# Patient Record
Sex: Female | Born: 1935 | Race: White | Hispanic: No | Smoking: Never smoker
Health system: Southern US, Community
[De-identification: ages and names within clinical notes are randomized; demographics above are authoritative.]

## PROBLEM LIST (undated history)

## (undated) DIAGNOSIS — K649 Unspecified hemorrhoids: Secondary | ICD-10-CM

## (undated) DIAGNOSIS — H6990 Unspecified Eustachian tube disorder, unspecified ear: Secondary | ICD-10-CM

## (undated) DIAGNOSIS — I209 Angina pectoris, unspecified: Secondary | ICD-10-CM

## (undated) DIAGNOSIS — Z9889 Other specified postprocedural states: Secondary | ICD-10-CM

## (undated) DIAGNOSIS — J189 Pneumonia, unspecified organism: Secondary | ICD-10-CM

## (undated) DIAGNOSIS — R112 Nausea with vomiting, unspecified: Secondary | ICD-10-CM

## (undated) DIAGNOSIS — H6980 Other specified disorders of Eustachian tube, unspecified ear: Secondary | ICD-10-CM

## (undated) DIAGNOSIS — T4145XA Adverse effect of unspecified anesthetic, initial encounter: Secondary | ICD-10-CM

## (undated) DIAGNOSIS — Z950 Presence of cardiac pacemaker: Secondary | ICD-10-CM

## (undated) DIAGNOSIS — R05 Cough: Secondary | ICD-10-CM

## (undated) DIAGNOSIS — N393 Stress incontinence (female) (male): Secondary | ICD-10-CM

## (undated) DIAGNOSIS — M1990 Unspecified osteoarthritis, unspecified site: Secondary | ICD-10-CM

## (undated) DIAGNOSIS — R0602 Shortness of breath: Secondary | ICD-10-CM

## (undated) DIAGNOSIS — C50919 Malignant neoplasm of unspecified site of unspecified female breast: Secondary | ICD-10-CM

## (undated) DIAGNOSIS — Z9010 Acquired absence of unspecified breast and nipple: Secondary | ICD-10-CM

## (undated) DIAGNOSIS — R059 Cough, unspecified: Secondary | ICD-10-CM

## (undated) DIAGNOSIS — I219 Acute myocardial infarction, unspecified: Secondary | ICD-10-CM

## (undated) DIAGNOSIS — R011 Cardiac murmur, unspecified: Secondary | ICD-10-CM

## (undated) DIAGNOSIS — I890 Lymphedema, not elsewhere classified: Secondary | ICD-10-CM

## (undated) DIAGNOSIS — T8859XA Other complications of anesthesia, initial encounter: Secondary | ICD-10-CM

## (undated) DIAGNOSIS — I10 Essential (primary) hypertension: Secondary | ICD-10-CM

## (undated) DIAGNOSIS — K219 Gastro-esophageal reflux disease without esophagitis: Secondary | ICD-10-CM

## (undated) DIAGNOSIS — H5440 Blindness, one eye, unspecified eye: Secondary | ICD-10-CM

## (undated) DIAGNOSIS — H409 Unspecified glaucoma: Secondary | ICD-10-CM

## (undated) DIAGNOSIS — I739 Peripheral vascular disease, unspecified: Secondary | ICD-10-CM

## (undated) DIAGNOSIS — I493 Ventricular premature depolarization: Secondary | ICD-10-CM

## (undated) DIAGNOSIS — E785 Hyperlipidemia, unspecified: Secondary | ICD-10-CM

## (undated) DIAGNOSIS — R55 Syncope and collapse: Secondary | ICD-10-CM

## (undated) HISTORY — DX: Hyperlipidemia, unspecified: E78.5

## (undated) HISTORY — DX: Other specified disorders of Eustachian tube, unspecified ear: H69.80

## (undated) HISTORY — DX: Malignant neoplasm of unspecified site of unspecified female breast: C50.919

## (undated) HISTORY — DX: Acute myocardial infarction, unspecified: I21.9

## (undated) HISTORY — DX: Ventricular premature depolarization: I49.3

## (undated) HISTORY — PX: DILATION AND CURETTAGE OF UTERUS: SHX78

## (undated) HISTORY — DX: Unspecified glaucoma: H40.9

## (undated) HISTORY — DX: Acquired absence of unspecified breast and nipple: Z90.10

## (undated) HISTORY — DX: Essential (primary) hypertension: I10

## (undated) HISTORY — DX: Gastro-esophageal reflux disease without esophagitis: K21.9

## (undated) HISTORY — DX: Cough: R05

## (undated) HISTORY — DX: Lymphedema, not elsewhere classified: I89.0

## (undated) HISTORY — DX: Unspecified eustachian tube disorder, unspecified ear: H69.90

## (undated) HISTORY — PX: REMOVAL OF BILATERAL TISSUE EXPANDERS WITH PLACEMENT OF BILATERAL BREAST IMPLANTS: SHX6431

## (undated) HISTORY — DX: Cough, unspecified: R05.9

---

## 1965-03-26 HISTORY — PX: ABDOMINAL HYSTERECTOMY: SHX81

## 1966-03-26 HISTORY — PX: MYOMECTOMY: SHX85

## 1979-03-27 HISTORY — PX: MASTECTOMY: SHX3

## 1983-03-27 HISTORY — PX: GALLBLADDER SURGERY: SHX652

## 1986-03-01 HISTORY — PX: CARDIAC CATHETERIZATION: SHX172

## 1991-03-27 HISTORY — PX: PLACEMENT OF BREAST IMPLANTS: SHX6334

## 2000-11-22 ENCOUNTER — Encounter: Payer: Self-pay | Admitting: Internal Medicine

## 2000-11-22 ENCOUNTER — Encounter: Admission: RE | Admit: 2000-11-22 | Discharge: 2000-11-22 | Payer: Self-pay | Admitting: Internal Medicine

## 2000-11-29 ENCOUNTER — Encounter: Payer: Self-pay | Admitting: Neurology

## 2000-11-29 ENCOUNTER — Encounter: Admission: RE | Admit: 2000-11-29 | Discharge: 2000-11-29 | Payer: Self-pay | Admitting: Neurology

## 2000-12-19 ENCOUNTER — Encounter: Payer: Self-pay | Admitting: Neurology

## 2000-12-19 ENCOUNTER — Encounter: Admission: RE | Admit: 2000-12-19 | Discharge: 2000-12-19 | Payer: Self-pay | Admitting: Neurology

## 2001-01-22 ENCOUNTER — Encounter: Payer: Self-pay | Admitting: Neurology

## 2001-01-22 ENCOUNTER — Ambulatory Visit (HOSPITAL_COMMUNITY): Admission: RE | Admit: 2001-01-22 | Discharge: 2001-01-22 | Payer: Self-pay | Admitting: *Deleted

## 2001-01-22 ENCOUNTER — Encounter: Admission: RE | Admit: 2001-01-22 | Discharge: 2001-01-22 | Payer: Self-pay | Admitting: Orthopedic Surgery

## 2001-03-06 ENCOUNTER — Encounter: Admission: RE | Admit: 2001-03-06 | Discharge: 2001-03-31 | Payer: Self-pay | Admitting: Neurology

## 2001-04-07 ENCOUNTER — Encounter: Admission: RE | Admit: 2001-04-07 | Discharge: 2001-07-06 | Payer: Self-pay | Admitting: Internal Medicine

## 2002-10-25 HISTORY — PX: CARDIAC CATHETERIZATION: SHX172

## 2002-11-01 ENCOUNTER — Inpatient Hospital Stay (HOSPITAL_COMMUNITY): Admission: EM | Admit: 2002-11-01 | Discharge: 2002-11-04 | Payer: Self-pay | Admitting: Emergency Medicine

## 2002-11-01 ENCOUNTER — Encounter: Payer: Self-pay | Admitting: Emergency Medicine

## 2002-11-01 ENCOUNTER — Encounter: Payer: Self-pay | Admitting: *Deleted

## 2003-03-27 HISTORY — PX: OTHER SURGICAL HISTORY: SHX169

## 2003-10-08 ENCOUNTER — Ambulatory Visit (HOSPITAL_COMMUNITY): Admission: RE | Admit: 2003-10-08 | Discharge: 2003-10-08 | Payer: Self-pay | Admitting: Gastroenterology

## 2003-11-01 ENCOUNTER — Encounter: Admission: RE | Admit: 2003-11-01 | Discharge: 2003-11-01 | Payer: Self-pay | Admitting: Gastroenterology

## 2003-11-20 ENCOUNTER — Encounter: Admission: RE | Admit: 2003-11-20 | Discharge: 2003-11-20 | Payer: Self-pay | Admitting: Orthopedic Surgery

## 2004-06-21 ENCOUNTER — Ambulatory Visit: Payer: Self-pay | Admitting: Internal Medicine

## 2004-10-06 ENCOUNTER — Ambulatory Visit: Payer: Self-pay | Admitting: Internal Medicine

## 2004-12-27 ENCOUNTER — Ambulatory Visit: Payer: Self-pay | Admitting: Internal Medicine

## 2005-01-29 ENCOUNTER — Ambulatory Visit: Payer: Self-pay | Admitting: Internal Medicine

## 2005-04-06 ENCOUNTER — Emergency Department (HOSPITAL_COMMUNITY): Admission: EM | Admit: 2005-04-06 | Discharge: 2005-04-06 | Payer: Self-pay | Admitting: Emergency Medicine

## 2005-06-18 ENCOUNTER — Ambulatory Visit (HOSPITAL_COMMUNITY): Admission: RE | Admit: 2005-06-18 | Discharge: 2005-06-18 | Payer: Self-pay | Admitting: Orthopedic Surgery

## 2005-10-08 ENCOUNTER — Ambulatory Visit: Payer: Self-pay | Admitting: Internal Medicine

## 2006-02-20 ENCOUNTER — Ambulatory Visit: Payer: Self-pay | Admitting: Internal Medicine

## 2006-04-05 ENCOUNTER — Ambulatory Visit: Payer: Self-pay | Admitting: Internal Medicine

## 2006-04-11 ENCOUNTER — Inpatient Hospital Stay (HOSPITAL_COMMUNITY): Admission: EM | Admit: 2006-04-11 | Discharge: 2006-04-12 | Payer: Self-pay | Admitting: Emergency Medicine

## 2006-04-11 HISTORY — PX: CARDIAC CATHETERIZATION: SHX172

## 2006-05-20 ENCOUNTER — Encounter: Admission: RE | Admit: 2006-05-20 | Discharge: 2006-05-20 | Payer: Self-pay | Admitting: Gastroenterology

## 2006-08-23 ENCOUNTER — Ambulatory Visit: Payer: Self-pay | Admitting: Internal Medicine

## 2007-01-11 DIAGNOSIS — H6980 Other specified disorders of Eustachian tube, unspecified ear: Secondary | ICD-10-CM | POA: Insufficient documentation

## 2007-01-11 DIAGNOSIS — Z9010 Acquired absence of unspecified breast and nipple: Secondary | ICD-10-CM | POA: Insufficient documentation

## 2007-01-11 DIAGNOSIS — J309 Allergic rhinitis, unspecified: Secondary | ICD-10-CM | POA: Insufficient documentation

## 2007-01-11 DIAGNOSIS — K219 Gastro-esophageal reflux disease without esophagitis: Secondary | ICD-10-CM | POA: Insufficient documentation

## 2007-01-11 DIAGNOSIS — I2510 Atherosclerotic heart disease of native coronary artery without angina pectoris: Secondary | ICD-10-CM | POA: Insufficient documentation

## 2007-01-11 DIAGNOSIS — J45901 Unspecified asthma with (acute) exacerbation: Secondary | ICD-10-CM | POA: Insufficient documentation

## 2007-04-03 ENCOUNTER — Ambulatory Visit: Payer: Self-pay | Admitting: Internal Medicine

## 2007-05-30 ENCOUNTER — Other Ambulatory Visit: Admission: RE | Admit: 2007-05-30 | Discharge: 2007-05-30 | Payer: Self-pay | Admitting: Obstetrics and Gynecology

## 2007-12-11 ENCOUNTER — Ambulatory Visit: Payer: Self-pay | Admitting: Internal Medicine

## 2007-12-18 ENCOUNTER — Ambulatory Visit: Payer: Self-pay | Admitting: Internal Medicine

## 2008-03-25 ENCOUNTER — Telehealth (INDEPENDENT_AMBULATORY_CARE_PROVIDER_SITE_OTHER): Payer: Self-pay | Admitting: *Deleted

## 2008-04-05 ENCOUNTER — Ambulatory Visit: Payer: Self-pay | Admitting: Pulmonary Disease

## 2008-04-23 ENCOUNTER — Ambulatory Visit: Payer: Self-pay | Admitting: Internal Medicine

## 2008-05-04 ENCOUNTER — Telehealth (INDEPENDENT_AMBULATORY_CARE_PROVIDER_SITE_OTHER): Payer: Self-pay | Admitting: *Deleted

## 2008-05-05 ENCOUNTER — Ambulatory Visit: Payer: Self-pay | Admitting: Internal Medicine

## 2008-05-10 ENCOUNTER — Telehealth: Payer: Self-pay | Admitting: Internal Medicine

## 2008-05-20 ENCOUNTER — Ambulatory Visit: Payer: Self-pay | Admitting: Internal Medicine

## 2008-05-20 DIAGNOSIS — I219 Acute myocardial infarction, unspecified: Secondary | ICD-10-CM | POA: Insufficient documentation

## 2008-06-17 ENCOUNTER — Ambulatory Visit: Payer: Self-pay | Admitting: Internal Medicine

## 2009-04-19 ENCOUNTER — Encounter: Admission: RE | Admit: 2009-04-19 | Discharge: 2009-04-19 | Payer: Self-pay | Admitting: Orthopedic Surgery

## 2010-02-07 ENCOUNTER — Telehealth (INDEPENDENT_AMBULATORY_CARE_PROVIDER_SITE_OTHER): Payer: Self-pay | Admitting: *Deleted

## 2010-03-16 ENCOUNTER — Telehealth (INDEPENDENT_AMBULATORY_CARE_PROVIDER_SITE_OTHER): Payer: Self-pay | Admitting: *Deleted

## 2010-03-26 HISTORY — PX: NM MYOCAR PERF WALL MOTION: HXRAD629

## 2010-04-16 ENCOUNTER — Encounter: Payer: Self-pay | Admitting: Gastroenterology

## 2010-04-25 NOTE — Progress Notes (Signed)
Summary: SINUS/ EAR PAIN  Phone Note Call from Patient Call back at Home Phone [phone]   Caller: Patient Call For: YOUNG Summary of Call: PT C/O SINUS CONGESTIONS/ EAR PAIN/ DIZZINESS DUE TO SINUS CONGESTION. (PT HASN'T BEEN SEEN IN OVER A YR). [facility] [address]  Initial call taken by: Tivis Ringer, CNA,  February 07, 2010 9:26 AM  Follow-up for Phone Call        sinus pressure/congestion with clear nasal drainage.  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  does not want an abx since she is not running a fever.  pt last seen in office 3.25.10.  no appts available with CDY.  pt wondered if she may take mucinex d, which i advised is fine.  would like to know any other recs.  please advise, thanks.  ALLERFIES: pcn, percodan, codeine.  [facility]  Follow-up by: Boone Master CNA/MA,  February 07, 2010 10:01 AM  Additional Follow-up for Phone Call Additional follow up Details #1::        Per CDY-Mucinex-D is fine and also consider Netipot.Reynaldo Minium CMA  February 07, 2010 11:49 AM     Additional Follow-up for Phone Call Additional follow up Details #2::    [facility] with Dr Roxy Cedar recommendations. Abigail Miyamoto RN  February 07, 2010 11:58 AM

## 2010-04-27 NOTE — Progress Notes (Signed)
Summary: ear pain > augmentin rx  Phone Note Call from Patient Call back at Home Phone [phone]   Caller: Patient Call For: young Summary of Call: pt c/o L ear throbbing. requests to see dr young this morning or tomorrow. she is taking musinex. no fever or cough. [facility] [address] Initial call taken by: Tivis Ringer, CNA,  March 16, 2010 9:42 AM  Follow-up for Phone Call        PT c/o L ear pain, sinus congestion, wheezing and incr SOB for 3-4 days...chest tightness. She denies any fever. She has been taking Mucinex D 1 by mouth two times a day. Pls advise. ALLERFIES: pcn, percodan, codeine Michel Bickers [facility]  March 16, 2010 12:38 PM  Additional Follow-up for Phone Call Additional follow up Details #1::        Cy recs:  Augmentin 500mg  1 two times a day.  # 14 x 0 refills.  Aundra Millet Reynolds LPN  March 16, 2010 2:21 PM   Called, spoke with pt.  She was informed of above recs per CY and verbalized understanding.  Aware [facility].  WCB if sxs do not improve or worsen. Additional Follow-up by: Gweneth Dimitri RN,  March 16, 2010 2:24 PM    New/Updated Medications: AUGMENTIN 500-125 MG TABS (AMOXICILLIN-POT CLAVULANATE) Take 1 tablet by mouth two times a day Prescriptions: AUGMENTIN 500-125 MG TABS (AMOXICILLIN-POT CLAVULANATE) Take 1 tablet by mouth two times a day  #14 x 0   Entered by:   Gweneth Dimitri RN   Authorized by:   Waymon Budge MD   Signed by:   Gweneth Dimitri RN on 03/16/2010   Method used:   [facility]        [facility] [phone][facility] (retail)       [address]       [place], [place]  [address]       Ph: [phone] or [phone]       Fax: [phone]   RxID:   [id number]

## 2010-08-11 NOTE — Assessment & Plan Note (Signed)
[facility]                             PULMONARY OFFICE NOTE   NAME:[name], Madison [name]                       MRN:          [id number]  DATE:02/20/2006                            DOB:          04-12-1933    PULMONARY/ALLERGY FOLLOW-UP:   PROBLEM LIST:  1. Allergic asthma.  2. Allergic rhinitis.  3. Eustachian dysfunction.  4. Coronary disease Madison Madison [name], M.D.)/MI.  5. Reflux.  6. Left mastectomy in 1980.   HISTORY:  1-year follow-up and she says this has been a better year.  She has not needed Advair since last winter, but does use Nasonex.  No  problems at all with her allergy vaccine.  She has a remote history of  bee sting desensitization by an allergist years ago and is familiar with  the Epipen.  We took time today to review risk/benefit considerations of  allergy vaccine, issues of administration outside of a medical office,  anaphylaxis, and epinephrine, and we refilled her epinephrine.   MEDICATIONS:  1. Allergy vaccine.  2. Epipen.  3. Prilosec.  4. Atenolol 25 mg.  5. Vytorin 10 mg.  6. Albuterol rescue inhaler.  7. Advair 250/50.  8. Flonase or Nasonex.  9. Occasional Benadryl or Claritin.   DRUG INTOLERANCES:  PENICILLIN, CODEINE, PERCODAN.   OBJECTIVE:  VITAL SIGNS:  Weight 170 pounds, blood pressure 108/60,  pulse regular 50, room air saturation 98%.  HEENT:  White mucous in the mid and inferior left nostril.  No postnasal  drainage or periorbital edema.  Conjunctivae were clear.  LUNGS:  Clear to P&A.  HEART:  Heart sounds regular and normal.   IMPRESSION:  1. Allergic rhinitis well controlled.  2. Mild intermittent asthma.   Note; she wishes to continue getting allergy vaccine and to give her own  injections.  We have reviewed epinephrine, beta blocker therapy, and her  cardiac disease history in the context of her allergy vaccine choices.   PLAN:  Schedule return 1 year, earlier p.r.n.  She will continue to  follow with Loraine Leriche A. Waynard Edwards, M.D. and Thereasa Solo. [name], M.D.     Clinton D. Maple Hudson, MD, Tonny Bollman, FACP  Electronically Signed    CDY/MedQ  DD: 02/20/2006  DT: 02/21/2006  Job #: [id number]   cc:   Loraine Leriche A. Perini, M.D.  Thereasa Solo. [name], M.D.

## 2010-08-11 NOTE — Op Note (Signed)
NAME:  Madison [name], Madison [name]                          ACCOUNT NO.:  [id number]   MEDICAL RECORD NO.:  [id number]                   PATIENT TYPE:  AMB   LOCATION:  ENDO                                 FACILITY:  [facility]   PHYSICIAN:  James L. Malon Kindle., M.D.          DATE OF BIRTH:  03-30-1933   DATE OF PROCEDURE:  10/08/2003  DATE OF DISCHARGE:                                 OPERATIVE REPORT   PROCEDURE:  Esophagogastroduodenoscopy.   MEDICATIONS:  Fentanyl 45 mcg, Versed 4.5 mg IV.   INDICATIONS FOR PROCEDURE:  Persistent esophageal reflux symptoms, despite  the use of Prilosec.   DESCRIPTION OF PROCEDURE:  The procedure had been explained to the patient  and consent obtained.  The patient in left lateral decubitus position.  Olympus scope is inserted and advanced.  The esophagus was entered easily.  The scope was advanced down to the stomach.  Port channel was seen and  passed.  The duodenum including the bulb and second portion were seen well  and was unremarkable.  Scope was withdrawn back into the stomach.  The  duodenal bulb was normal as was the port channel and antrum of the stomach.  Body of the stomach was free of ulcerations.  Fundus and cardia seen well in  the rectal ampulla and was normal.  There was a 3 to 4 cm hiatal hernia with  a widely patent GE junction and free reflux.  The distal and proximal  esophagus were endoscopically normal. The scope was withdrawn.  The patient  tolerated the procedure well, was maintained on low flow oxygen pulse  oximeter throughout the procedure.   ASSESSMENT:  Esophageal reflux disease nonresponsive to Prilosec, 530.81.   PLAN:  Will continue Prilosec.  Will increase to b.i.d.  Will also go ahead  and give her a trial of Reglan 10 mg a.c. and h.s.  Will give antireflux  instructions and see her back in the office in two to three months to  determine if we may need to consider an antireflux operation.  Will proceed  with colonoscopy at  this time as already planned.                                               James L. Malon Kindle., M.D.    Waldron Session  D:  10/08/2003  T:  10/08/2003  Job:  [id number]   cc:   Loraine Leriche A. Waynard Edwards, M.D.  [address]  [place]  [place] [address]  Fax: [phone]   Thereasa Solo. Little, M.D.  [address]  [address]  [place]  [place] [address]  Fax: [phone]

## 2010-08-11 NOTE — Op Note (Signed)
NAME:  Madison [name], Madison [name]                          ACCOUNT NO.:  [id number]   MEDICAL RECORD NO.:  [id number]                   PATIENT TYPE:  AMB   LOCATION:  ENDO                                 FACILITY:  [facility]   PHYSICIAN:  James L. Malon Kindle., M.D.          DATE OF BIRTH:  1933/04/15   DATE OF PROCEDURE:  10/08/2003  DATE OF DISCHARGE:                                 OPERATIVE REPORT   PROCEDURE:  Colonoscopy.   MEDICATIONS GIVEN:  Fentanyl 70 mcg, Versed 7.5 mg IV for both procedures.   INSTRUMENT USED:  Olympus pediatric colonoscope.   INDICATIONS FOR PROCEDURE:  Previous history of adenomatous polyps removed  in another town.  In addition to this, she has a sister with colon cancer  and two sisters who have had polyps.   DESCRIPTION OF PROCEDURE:  The procedure was explained to the patient and  consent obtained.  With the patient in the left lateral decubitus position,  the Olympus pediatric adjustable scope was inserted and advanced.  The prep  was excellent.  We were able to reach the cecum without difficulty.  The  ileocecal valve and appendiceal orifice were seen.  The scope was withdrawn  to the cecum, ascending colon, transverse colon, descending colon and  sigmoid colon were seen well, and were free of polyps.  No significant  diverticular disease.  The rectum was also free of polyps.  The scope was  withdrawn.  The patient tolerated the procedure well.   ASSESSMENT:  No evidence of further colon polyps, V12.72.   PLAN:  Will repeating colonoscopy in five years and recommend yearly  hemoccults.                                               James L. Malon Kindle., M.D.    Waldron Session  D:  10/08/2003  T:  10/08/2003  Job:  [id number]   cc:   Loraine Leriche A. Waynard Edwards, M.D.  [address]  [place]  [place] [address]  Fax: [phone]   Thereasa Solo. Little, M.D.  [address]  [address]  [place]  [place] [address]  Fax: [phone]

## 2010-08-11 NOTE — Discharge Summary (Signed)
NAMEMarland Kitchen  Madison [name], Madison [name]   MEDICAL RECORD NO.:  [id number]          PATIENT TYPE:  INP   LOCATION:  2022                         FACILITY:  [facility]   PHYSICIAN:  Lezlie Octave, N.P.     DATE OF BIRTH:  11-Apr-1933   DATE OF ADMISSION:  04/10/2006  DATE OF DISCHARGE:  04/12/2006                               DISCHARGE SUMMARY   Madison [name] is a 77 year old female patient with a history of coronary  artery disease status post MI remotely.  She presented to the ER with  complaints of chest pain.  She stated that several weeks ago she had had  acute onset of blurred vision and weakness associated with tightness and  chest and jaw pain that lasted 30 to 40 seconds and then abated.  She  apparently underwent echocardiogram and Myoview testing which showed  some impaired LV relaxation, mild AI, and no evidence of ischemia on the  Myoview.  Her blood pressure apparently is 182/102 and Avapro of 150 mg  everyday was started.  Since then, she has felt poorly with increased  fatigue and weakness.  Blood pressure is 100/62.  She again experienced  blurred vision lasting 30 seconds followed by chest pain, jaw, and pain  and weakness.  She took a nitroglycerin with some relief and called EMS.  She was brought to the emergency room.  She was admitted, and heparin  was started the following day.  It was discussed doing cardiac  catheterization; she agreed, and the procedure was performed by Dr.  Chanda Busing.  It revealed that she had a supernormal LV systolic  function.  She had trivial luminal irregularities in the coronary  arteries and the mid-LAD with 50%.  She had no significant narrowing in  her RCA.  Her Avapro was being held, and on April 12, 2006 she was  seen by Dr. Tresa Endo and considered stable for discharge home.  Blood  pressure was 104/63, pulse was 50, respirations were 20, temperature was  96.8.   LABORATORY DATA:  Hemoglobin 12.2, hematocrit 35.3,  platelets 206, WBC  5.6, sodium 141, potassium 4.1, BUN 13, creatinine 1.08, and platelets  were 111.  Other labs:  Lipid profile, total cholesterol is 192,  triglycerides 116, HDL is 44, LDL was 125.  CK-MBs were negative.  Urine  culture was negative.  D-dimer was less than 0.22.  TSH was 2.880.  EKG  showed no acute thoracic findings.  Prior left mastectomy, and mild left  basilar scarring.   DISCHARGE MEDICATIONS:  1. Aspirin 81 mg one time per day.  2. Plavix 75 mg one time per day.  3. Atenolol 25 mg one time per day.  4. Prilosec 20 mg one time per day.  5. Lipitor 10 mg one time per day.  6. Fish oil 2 per day as taken prior.  7. Caltrate taken as before.  8. She is not to take her Avapro.   She will follow up with Dr. Clarene Duke on April 17, 2006 at 4 p.m.  She  should see Dr. Waynard Edwards in followup.  She should avoid lifting, straining,  pushing, and pulling for a week, and she is to increase her activity  slowly.   DISCHARGE DIAGNOSES:  1. Chest pain, unknown etiology, not cardiac ischemia with cardiac      catheterization showing no high-grade disease.  2. Fatigue.  Improved off of Avapro.  3. Hypertension, controlled.  4. Dyslipidemia, now on Lipitor.  5. Prior history of elevated LFTs (liver function tests) with Zocor.  6. Status post a history of breast cancer with mastectomy.  7. History of hiatal hernia.  8. Status post cholecystectomy.  9. Hysterectomy.      Lezlie Octave, N.P.     BB/MEDQ  D:  04/12/2006  T:  04/13/2006  Job:  [id number]   cc:   Loraine Leriche A. Perini, M.D.

## 2010-08-11 NOTE — Cardiovascular Report (Signed)
Madison [name], Madison [name] NO.:  [id number]   MEDICAL RECORD NO.:  [id number]          PATIENT TYPE:  INP   LOCATION:  2022                         FACILITY:  [facility]   PHYSICIAN:  Madaline Savage, M.D.DATE OF BIRTH:  16-Nov-1933   DATE OF PROCEDURE:  04/11/2006  DATE OF DISCHARGE:                            CARDIAC CATHETERIZATION   PROCEDURES PERFORMED:  1. Selective coronary angiography by Judkins technique.  2. Retrograde left heart catheterization.  3. Left ventricular angiography.   COMPLICATIONS:  None.   ENTRY SITE:  Right femoral.   DYE USED:  Omnipaque.   PATIENT PROFILE:  The patient is a 77 year old white married female who  is a medical patient of Dr. Rolm Gala and a cardiology patient of Dr.  Caprice Kluver.  She has a history of cardiac catheterization dating back to  1987 and then another catheterization 2004 that was apparently done at  [facility].  I have never seen a cath  report, but it was said at that time she may have had as much as a 70%  smooth lesion in her LAD.  Dr. Susa Griffins did a cath on her in  2004 and she had a 60-70% smooth LAD stenosis and a 30% RCA lesion.   I saw the patient on 05/16/2005 after an episode of chest pain and  performed an echocardiogram which was normal and a Cardiolite stress  test which showed no evidence of ischemia and an LVEF of 81%.  The  patient was reassured.  Since February, the patient has had at least 2  more episodes of chest pain that have caused her to come to the office  for attention.  On these occasions, she has seen different physicians  and both have felt that the patient's symptoms were noncardiac, atypical  and that the patient had a high level of anxiety.   The patient was admitted to the hospital last evening with complaints of  chest pain.  Cardiac enzymes have been negative.  EKGs have failed to  show any sign of ischemia.  Today's cardiac  catheterization was  performed after discussing the issues with the patient's husband and the  patient herself.  We predicted that cath will be negative, but we feel  like it should be done to reassure the patient.   RESULTS:  The left ventricular pressure was 135/6, end-diastolic  pressure 17.  Central aortic pressure 135/60, mean of 95.  No aortic  valve gradient by pullback technique.   ANGIOGRAPHIC RESULTS:  The patient's left main coronary artery was large  in size, medium in length, noncalcified and normal.   The LAD was a relatively small and definitely smaller than the  circumflex.  The LAD contained mild calcification in a linear fashion  along the left main and there was an area of 50% smooth eccentric  narrowing in the LAD just beyond only diagonal in the patient's coronary  system.  It did not appear to be flow limiting.  The diagonal itself  appeared normal.  One major septal appeared normal.   The circumflex coronary artery was  nondominant and gave rise to a large  obtuse marginal branch which bifurcated.  No lesions were seen in the  circumflex coronary artery.   The right coronary artery was a dominant vessel giving rise to both a  pulmonary conus and a sinus node branch, 2 acute marginal branches and  at least 4-5 distal RCA branches, one of which was a posterior  descending.  No lesions were seen in the entirety of the right coronary  artery system.  Left ventricular angiography showed normal  contractility, estimated ejection fraction of 70%.  No wall motion  abnormalities.  No mitral regurgitation.  No evidence of mitral  prolapse.  No LV apical thrombus and ascending aorta appeared  unremarkable.   FINAL DIAGNOSES:  1. Recurrent chronic chest pain atypical in nature.  2. Supernormal left ventricular systolic function.  3. Trivial luminal irregularities in the coronary arteries and the mid-      left anterior descending 50%.  I did not see any significant       narrowing in the right coronary artery.   PLAN:  The patient is reassured.  She will follow up with the excellent  care of Dr. Waynard Edwards and Dr. Clarene Duke and will possibly be discharged today  or tomorrow.           ______________________________  Madaline Savage, M.D.     WHG/MEDQ  D:  04/11/2006  T:  04/11/2006  Job:  [id number]   cc:   Loraine Leriche A. Perini, M.D.  Thereasa Solo. Little, M.D.  [facility]

## 2010-12-28 ENCOUNTER — Encounter (HOSPITAL_BASED_OUTPATIENT_CLINIC_OR_DEPARTMENT_OTHER): Payer: Medicare Other | Admitting: Oncology

## 2010-12-28 ENCOUNTER — Encounter: Payer: Medicare Other | Admitting: Oncology

## 2010-12-28 DIAGNOSIS — Z853 Personal history of malignant neoplasm of breast: Secondary | ICD-10-CM

## 2011-05-14 ENCOUNTER — Telehealth: Payer: Self-pay | Admitting: Internal Medicine

## 2011-05-14 MED ORDER — AZITHROMYCIN 250 MG PO TABS: ORAL_TABLET | ORAL | Status: DC

## 2011-05-14 NOTE — Telephone Encounter (Signed)
Per CY-give Zpak #1 take as directed no refills and offer appt when able.

## 2011-05-14 NOTE — Telephone Encounter (Signed)
I spoke with pt and is aware of cdy recs. She voiced her understanding and would like [facility] [address]. Pt alos made an apt for 06/06/11 at 9:45

## 2011-05-14 NOTE — Telephone Encounter (Signed)
Pt says about 2 weeks ago she began having sinus congestion and drainage and since last Thurs., 2/14 she has had yellow nasal drainage, cough with white mucus, sore throat off and on, wheezing and chest tightness. She is currently using Benadryl, Mucinex D and Saline NS. Pt denies any increased sob or fever. Pls advise. Allergies  Allergen Reactions  . Codeine   . Oxycodone-Aspirin   . Penicillins

## 2011-05-21 ENCOUNTER — Telehealth: Payer: Self-pay | Admitting: Pulmonary Disease

## 2011-05-21 NOTE — Telephone Encounter (Signed)
I spoke with the pt and she states she is much better since she took the zpak prescribed last week, but she is not 100% better. I advised the pt to continue the mucinex and saline NS and that the zpak will keep working in her system for several more days. The pt has an appt on 06-06-11 with CY. Carron Curie, CMA

## 2011-06-06 ENCOUNTER — Ambulatory Visit (INDEPENDENT_AMBULATORY_CARE_PROVIDER_SITE_OTHER): Payer: Medicare Other | Admitting: Internal Medicine

## 2011-06-06 ENCOUNTER — Encounter: Payer: Self-pay | Admitting: Internal Medicine

## 2011-06-06 VITALS — BP 118/74 | HR 77 | Ht 64.0 in | Wt 173.0 lb

## 2011-06-06 DIAGNOSIS — J45909 Unspecified asthma, uncomplicated: Secondary | ICD-10-CM

## 2011-06-06 DIAGNOSIS — J309 Allergic rhinitis, unspecified: Secondary | ICD-10-CM

## 2011-06-06 NOTE — Progress Notes (Signed)
06/06/11- 77 yoF followed for allergic asthma, allergic rhinitis complicated by GERD, CAD/MI, hx L mastectomy LOV- 06/17/08 Z pak and Mucinex helped her through a bronchitis in mid-February. Since then mild residual congestion is slowly clearing and she denies need for further intervention. Daily saline nasal spray, humidifier in bedroom. Regularly at gym.  Discussed antihistamines vs decongestants and use as needed during Spring pollen.  ROS-see HPI Constitutional:   No-   weight loss, night sweats, fevers, chills, fatigue, lassitude. HEENT:   No-  headaches, difficulty swallowing, tooth/dental problems, sore throat,       No-  sneezing, itching, ear ache,  +nasal congestion, post nasal drip,  CV:  No-   chest pain, orthopnea, PND, swelling in lower extremities, anasarca, dizziness, palpitations Resp: No-   shortness of breath with exertion or at rest.              No-   productive cough,  + non-productive cough,  No- coughing up of blood.              No-   change in color of mucus.  No- wheezing.   Skin: No-   rash or lesions. GI:  No-   heartburn, indigestion, abdominal pain, nausea, vomiting, GU: No-   dysuria,  MS:  No-   joint pain or swelling.  No- decreased range of motion.  No- back pain. Neuro-     nothing unusual Psych:  No- change in mood or affect. No depression or anxiety.  No memory loss.  OBJ- Physical Exam General- Alert, Oriented, Affect-appropriate, Distress- none acute Skin- rash-none, lesions- none, excoriation- none Lymphadenopathy- none Head- atraumatic            Eyes- Gross vision intact, PERRLA, conjunctivae and secretions clear            Ears- Hearing, canals-normal            Nose- Clear, no-Septal dev, mucus, polyps, erosion, perforation             Throat- Mallampati II , mucosa clear , drainage- none, tonsils- atrophic Neck- flexible , trachea midline, no stridor , thyroid nl, carotid no bruit Chest - symmetrical excursion , unlabored           Heart/CV-  RRR , no murmur , no gallop  , no rub, nl s1 s2                           - JVD- none , edema- none, stasis changes- none, varices- none           Lung- clear to P&A, wheeze- none, cough- none , dullness-none, rub- none           Chest wall-  Abd Br/ Gen/ Rectal- Not done, not indicated Extrem- cyanosis- none, clubbing, none, atrophy- none, strength- nl Neuro- tremor

## 2011-06-06 NOTE — Assessment & Plan Note (Signed)
Exacerbation with a viral bronchitis during the winter, but not needing significant intervention now and gradually back to baseline. She will call for meds if needed.

## 2011-06-06 NOTE — Patient Instructions (Signed)
Please call as needed 

## 2011-06-06 NOTE — Assessment & Plan Note (Signed)
Discussed management of seasonal rhinitis.

## 2011-07-20 ENCOUNTER — Ambulatory Visit: Payer: Medicare Other | Admitting: Family

## 2012-03-26 HISTORY — PX: TRANSTHORACIC ECHOCARDIOGRAM: SHX275

## 2012-04-21 ENCOUNTER — Other Ambulatory Visit (HOSPITAL_COMMUNITY): Payer: Self-pay | Admitting: Internal Medicine

## 2012-04-21 DIAGNOSIS — R0600 Dyspnea, unspecified: Secondary | ICD-10-CM

## 2012-04-21 DIAGNOSIS — R011 Cardiac murmur, unspecified: Secondary | ICD-10-CM

## 2012-05-05 ENCOUNTER — Ambulatory Visit (HOSPITAL_COMMUNITY)
Admission: RE | Admit: 2012-05-05 | Discharge: 2012-05-05 | Disposition: A | Discharge status: Home / Self Care | Payer: Medicare Other | Source: Ambulatory Visit | Attending: Internal Medicine | Admitting: Internal Medicine

## 2012-05-05 DIAGNOSIS — I059 Rheumatic mitral valve disease, unspecified: Secondary | ICD-10-CM | POA: Insufficient documentation

## 2012-05-05 DIAGNOSIS — I369 Nonrheumatic tricuspid valve disorder, unspecified: Secondary | ICD-10-CM | POA: Insufficient documentation

## 2012-05-05 DIAGNOSIS — R0600 Dyspnea, unspecified: Secondary | ICD-10-CM

## 2012-05-05 DIAGNOSIS — R0602 Shortness of breath: Secondary | ICD-10-CM | POA: Insufficient documentation

## 2012-05-05 DIAGNOSIS — R011 Cardiac murmur, unspecified: Secondary | ICD-10-CM

## 2012-05-05 HISTORY — PX: OTHER SURGICAL HISTORY: SHX169

## 2012-05-05 NOTE — Progress Notes (Signed)
2D Echo Performed 05/05/2012    Clearence Ped, RCS

## 2012-06-03 ENCOUNTER — Telehealth: Payer: Self-pay | Admitting: Internal Medicine

## 2012-06-03 NOTE — Telephone Encounter (Signed)
ATC patient x3. No return call back. Sent letter 06/03/12.  °

## 2013-01-09 ENCOUNTER — Ambulatory Visit (INDEPENDENT_AMBULATORY_CARE_PROVIDER_SITE_OTHER): Payer: Medicare Other

## 2013-01-09 DIAGNOSIS — Z23 Encounter for immunization: Secondary | ICD-10-CM

## 2013-03-10 ENCOUNTER — Other Ambulatory Visit (HOSPITAL_COMMUNITY): Payer: Medicare Other

## 2013-03-26 DIAGNOSIS — Z950 Presence of cardiac pacemaker: Secondary | ICD-10-CM

## 2013-03-26 HISTORY — DX: Presence of cardiac pacemaker: Z95.0

## 2013-08-06 ENCOUNTER — Telehealth: Payer: Self-pay

## 2013-08-06 NOTE — Telephone Encounter (Signed)
Left message to call office to discuss symptoms.

## 2013-08-06 NOTE — Telephone Encounter (Signed)
Message copied by Denman George on Thu Aug 06, 2013  4:51 PM ------      Message from: Merleen Nicely      Created: Thu Aug 06, 2013  3:35 PM      Contact: [phone]       Alorah needs an appointment asap. Said she called and left a message.            Please call.            Thanks      Ebony Hail             ------

## 2013-08-07 NOTE — Telephone Encounter (Signed)
Attempted to contact pt.  Left voice message to call back to office to discuss symptoms.

## 2013-08-10 ENCOUNTER — Telehealth: Payer: Self-pay | Admitting: *Deleted

## 2013-08-10 NOTE — Telephone Encounter (Signed)
LMTCB regarding patient's request to make an appt. We have not seen her before; we had a past appt for a carotid duplex on 03-10-13 ( referred from Dr. Joylene Draft)  in our vascular lab but the patient had called and cancelled that appt. Called her to see what symptoms she has and to find out if she needs another referral from Dr. Joylene Draft.

## 2013-08-24 ENCOUNTER — Ambulatory Visit (HOSPITAL_COMMUNITY)
Admission: RE | Admit: 2013-08-24 | Discharge: 2013-08-24 | Disposition: A | Discharge status: Home / Self Care | Payer: Medicare HMO | Source: Ambulatory Visit | Attending: Surgery | Admitting: Surgery

## 2013-08-24 ENCOUNTER — Other Ambulatory Visit (HOSPITAL_COMMUNITY): Payer: Self-pay | Admitting: Internal Medicine

## 2013-08-24 DIAGNOSIS — R42 Dizziness and giddiness: Secondary | ICD-10-CM | POA: Insufficient documentation

## 2013-11-03 ENCOUNTER — Telehealth: Payer: Self-pay | Admitting: Internal Medicine

## 2013-11-03 NOTE — Telephone Encounter (Signed)
Thanks .. Sounds good.  Dr. H 

## 2013-11-03 NOTE — Telephone Encounter (Signed)
Patient states that she has been having some shortness of breath, fluid retention and some L. Shoulder pain.  Please call to advise.

## 2013-11-03 NOTE — Telephone Encounter (Signed)
Spoke with [place], [place]. Advised that if patient is not having active chest pain, to schedule an appmt with Dr. Debara Pickett or APP.   Patient is scheduled for 8/20 at 945.   Brittainy, PA also advised that patient use NTG prn for chest pain. When this was communicated to patient, patient informed RN that she had taken NTGx2 yesterday after episode while working in garden.   She also reports that when she used to see Dr. Rex Kras, he had told her she could use NTG for pain associated with her hiatal hernia, and has been using it occasionally for this for the last 6 months, so she thinks she may have actually been having cardiac issues for longer.   RN advised patient to notify office if she is have more frequent episode of chest discomfort/is having to use more NTG to obtain relief/is having to use NTG more frequently. Advised that if this, or worsening SOB occurs, to contact office to let us know and go to [facility].   Patient voiced understanding.

## 2013-11-03 NOTE — Telephone Encounter (Signed)
Returned call to patient. She states she had a heart attack a few years back, has some known blockages but has never had a stent. She is concerned because she has recently had some lightheadedness, pain in left shoulder and ear. Complains of extreme shortness of breath (as in when taking trash out and walking over small hill). She states yesterday she was doing some gardening and moved a pot and had a horrible pain in the center of her chest, cold sweats and felt lightheaded. She had a carotid doppler study in June done at [facility] that Dr. Joylene Draft ordered for dizziness that did not indicated carotid artery disease. Her last OV with Dr. Debara Pickett was Feb. 2014

## 2013-11-04 ENCOUNTER — Encounter: Payer: Self-pay | Admitting: *Deleted

## 2013-11-12 ENCOUNTER — Encounter (HOSPITAL_COMMUNITY): Payer: Self-pay | Admitting: *Deleted

## 2013-11-12 ENCOUNTER — Ambulatory Visit (INDEPENDENT_AMBULATORY_CARE_PROVIDER_SITE_OTHER): Payer: Commercial Managed Care - HMO | Admitting: Internal Medicine

## 2013-11-12 VITALS — BP 110/82 | HR 65 | Ht 64.0 in | Wt 177.0 lb

## 2013-11-12 DIAGNOSIS — I493 Ventricular premature depolarization: Secondary | ICD-10-CM

## 2013-11-12 DIAGNOSIS — I219 Acute myocardial infarction, unspecified: Secondary | ICD-10-CM

## 2013-11-12 DIAGNOSIS — E785 Hyperlipidemia, unspecified: Secondary | ICD-10-CM

## 2013-11-12 DIAGNOSIS — I10 Essential (primary) hypertension: Secondary | ICD-10-CM

## 2013-11-12 DIAGNOSIS — R079 Chest pain, unspecified: Secondary | ICD-10-CM

## 2013-11-12 DIAGNOSIS — I4949 Other premature depolarization: Secondary | ICD-10-CM

## 2013-11-12 DIAGNOSIS — I2510 Atherosclerotic heart disease of native coronary artery without angina pectoris: Secondary | ICD-10-CM

## 2013-11-12 MED ORDER — NITROGLYCERIN 0.4 MG SL SUBL: 0.4000 mg | SUBLINGUAL_TABLET | SUBLINGUAL | Status: DC | PRN

## 2013-11-12 NOTE — Patient Instructions (Signed)
Your physician has requested that you have a lexiscan myoview. For further information please visit [email or web address]. Please follow instruction sheet, as given.  Your physician recommends that you schedule a follow-up appointment after your stress test.   

## 2013-11-13 ENCOUNTER — Telehealth (HOSPITAL_COMMUNITY): Payer: Self-pay | Admitting: *Deleted

## 2013-11-13 ENCOUNTER — Encounter: Payer: Self-pay | Admitting: Internal Medicine

## 2013-11-13 DIAGNOSIS — E785 Hyperlipidemia, unspecified: Secondary | ICD-10-CM | POA: Insufficient documentation

## 2013-11-13 DIAGNOSIS — I493 Ventricular premature depolarization: Secondary | ICD-10-CM | POA: Insufficient documentation

## 2013-11-13 DIAGNOSIS — I10 Essential (primary) hypertension: Secondary | ICD-10-CM | POA: Insufficient documentation
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
## 2013-11-17 ENCOUNTER — Encounter (HOSPITAL_COMMUNITY): Payer: Self-pay | Admitting: Emergency Medicine

## 2013-11-17 ENCOUNTER — Telehealth: Payer: Self-pay | Admitting: Internal Medicine

## 2013-11-17 ENCOUNTER — Inpatient Hospital Stay (HOSPITAL_COMMUNITY)
Admission: EM | Admit: 2013-11-17 | Discharge: 2013-11-19 | DRG: 244 | Disposition: A | Discharge status: Home / Self Care | Payer: Medicare HMO | Attending: Cardiology | Admitting: Cardiology

## 2013-11-17 ENCOUNTER — Emergency Department (HOSPITAL_COMMUNITY): Payer: Medicare HMO

## 2013-11-17 DIAGNOSIS — K219 Gastro-esophageal reflux disease without esophagitis: Secondary | ICD-10-CM | POA: Diagnosis present

## 2013-11-17 DIAGNOSIS — E785 Hyperlipidemia, unspecified: Secondary | ICD-10-CM | POA: Diagnosis present

## 2013-11-17 DIAGNOSIS — J45909 Unspecified asthma, uncomplicated: Secondary | ICD-10-CM | POA: Diagnosis present

## 2013-11-17 DIAGNOSIS — R55 Syncope and collapse: Secondary | ICD-10-CM | POA: Diagnosis not present

## 2013-11-17 DIAGNOSIS — I495 Sick sinus syndrome: Principal | ICD-10-CM | POA: Diagnosis present

## 2013-11-17 DIAGNOSIS — Z7982 Long term (current) use of aspirin: Secondary | ICD-10-CM

## 2013-11-17 DIAGNOSIS — I2510 Atherosclerotic heart disease of native coronary artery without angina pectoris: Secondary | ICD-10-CM | POA: Diagnosis present

## 2013-11-17 DIAGNOSIS — I252 Old myocardial infarction: Secondary | ICD-10-CM | POA: Diagnosis not present

## 2013-11-17 DIAGNOSIS — I10 Essential (primary) hypertension: Secondary | ICD-10-CM | POA: Diagnosis present

## 2013-11-17 DIAGNOSIS — Z853 Personal history of malignant neoplasm of breast: Secondary | ICD-10-CM | POA: Diagnosis not present

## 2013-11-17 DIAGNOSIS — I440 Atrioventricular block, first degree: Secondary | ICD-10-CM | POA: Diagnosis present

## 2013-11-17 DIAGNOSIS — Z8249 Family history of ischemic heart disease and other diseases of the circulatory system: Secondary | ICD-10-CM

## 2013-11-17 DIAGNOSIS — Z9010 Acquired absence of unspecified breast and nipple: Secondary | ICD-10-CM | POA: Diagnosis not present

## 2013-11-17 DIAGNOSIS — I455 Other specified heart block: Secondary | ICD-10-CM | POA: Diagnosis present

## 2013-11-17 HISTORY — DX: Syncope and collapse: R55

## 2013-11-17 HISTORY — DX: Other complications of anesthesia, initial encounter: T88.59XA

## 2013-11-17 HISTORY — DX: Adverse effect of unspecified anesthetic, initial encounter: T41.45XA

## 2013-11-17 HISTORY — DX: Peripheral vascular disease, unspecified: I73.9

## 2013-11-17 HISTORY — DX: Other specified postprocedural states: R11.2

## 2013-11-17 HISTORY — DX: Cardiac murmur, unspecified: R01.1

## 2013-11-17 HISTORY — DX: Shortness of breath: R06.02

## 2013-11-17 HISTORY — DX: Angina pectoris, unspecified: I20.9

## 2013-11-17 HISTORY — DX: Other specified postprocedural states: Z98.890

## 2013-11-17 HISTORY — DX: Unspecified osteoarthritis, unspecified site: M19.90

## 2013-11-17 LAB — CBC
HCT: 39.7 % (ref 36.0–46.0)
HEMATOCRIT: 37.1 % (ref 36.0–46.0)
Hemoglobin: 13.5 g/dL (ref 12.0–15.0)
Hemoglobin: 12.7 g/dL (ref 12.0–15.0)
MCH: 29.5 pg (ref 26.0–34.0)
MCH: 29.2 pg (ref 26.0–34.0)
MCHC: 34 g/dL (ref 30.0–36.0)
MCHC: 34.2 g/dL (ref 30.0–36.0)
MCV: 86.9 fL (ref 78.0–100.0)
MCV: 85.3 fL (ref 78.0–100.0)
PLATELETS: 196 10*3/uL (ref 150–400)
Platelets: 185 10*3/uL (ref 150–400)
RBC: 4.57 MIL/uL (ref 3.87–5.11)
RBC: 4.35 MIL/uL (ref 3.87–5.11)
RDW: 12.4 % (ref 11.5–15.5)
RDW: 12.3 % (ref 11.5–15.5)
WBC: 4.6 10*3/uL (ref 4.0–10.5)
WBC: 5.3 10*3/uL (ref 4.0–10.5)

## 2013-11-17 LAB — BASIC METABOLIC PANEL
ANION GAP: 10 (ref 5–15)
BUN: 23 mg/dL (ref 6–23)
CO2: 24 mEq/L (ref 19–32)
Calcium: 9.2 mg/dL (ref 8.4–10.5)
Chloride: 105 mEq/L (ref 96–112)
Creatinine, Ser: 1.06 mg/dL (ref 0.50–1.10)
GFR calc non Af Amer: 49 mL/min — ABNORMAL LOW (ref >90)
GFR, EST AFRICAN AMERICAN: 57 mL/min — AB (ref >90)
Glucose, Bld: 99 mg/dL (ref 70–99)
Potassium: 4.3 mEq/L (ref 3.7–5.3)
Sodium: 139 mEq/L (ref 137–147)

## 2013-11-17 LAB — CREATININE, SERUM
Creatinine, Ser: 0.98 mg/dL (ref 0.50–1.10)
GFR calc Af Amer: 62 mL/min — ABNORMAL LOW (ref >90)
GFR calc non Af Amer: 54 mL/min — ABNORMAL LOW (ref >90)

## 2013-11-17 LAB — TSH: TSH: 1.95 u[IU]/mL (ref 0.350–4.500)

## 2013-11-17 LAB — I-STAT TROPONIN, ED: Troponin i, poc: 0 ng/mL (ref 0.00–0.08)

## 2013-11-17 LAB — TROPONIN I
Troponin I: <0.3 ng/mL (ref <0.30)
Troponin I: <0.3 ng/mL (ref <0.30)
Troponin I: <0.3 ng/mL (ref <0.30)

## 2013-11-17 MED ORDER — HEPARIN SODIUM (PORCINE) 5000 UNIT/ML IJ SOLN
5000.0000 [IU] | Freq: Three times a day (TID) | INTRAMUSCULAR | Status: DC
  Administered 2013-11-17 – 2013-11-19 (×5): 5000 [IU] via SUBCUTANEOUS
  Filled 2013-11-17 (×10): qty 1

## 2013-11-17 MED ORDER — ASPIRIN 81 MG PO CHEW
324.0000 mg | CHEWABLE_TABLET | Freq: Once | ORAL | Status: AC
  Administered 2013-11-17: 162 mg via ORAL
  Filled 2013-11-17: qty 4

## 2013-11-17 MED ORDER — NITROGLYCERIN 0.4 MG SL SUBL: 0.4000 mg | SUBLINGUAL_TABLET | SUBLINGUAL | Status: DC | PRN

## 2013-11-17 MED ORDER — ASPIRIN EC 81 MG PO TBEC
81.0000 mg | DELAYED_RELEASE_TABLET | Freq: Every day | ORAL | Status: DC
  Administered 2013-11-18 – 2013-11-19 (×2): 81 mg via ORAL
  Filled 2013-11-17 (×2): qty 1

## 2013-11-17 MED ORDER — ASPIRIN EC 81 MG PO TBEC: 81.0000 mg | DELAYED_RELEASE_TABLET | Freq: Every day | ORAL | Status: DC

## 2013-11-17 MED ORDER — ASPIRIN 81 MG PO CHEW: 81.0000 mg | CHEWABLE_TABLET | ORAL | Status: DC

## 2013-11-17 NOTE — ED Notes (Addendum)
Pt reports having episodes of left side chest pains that radiates into her jaw and neck. Pt had episode several days ago and went to pcp and scheduled for stress test. Pt had another episode this am along with feeling lightheaded and near syncopal. ekg done at triage, airway intact. Denies any pain at this time, only has fatigue and weakness, had episode of nausea and sob this am. Took two nitro pta.

## 2013-11-17 NOTE — ED Notes (Signed)
Admit Doctor printed our EKG strips of intermittent pauses.

## 2013-11-17 NOTE — Telephone Encounter (Signed)
Returned a call to patient. No answer. Left message a call has been returned. Call back if assistance is still needed.

## 2013-11-17 NOTE — ED Notes (Signed)
Patient transported to X-ray 

## 2013-11-17 NOTE — Telephone Encounter (Signed)
Pt had neck pain,felt like she was going to pass out,wanted you to know she is on her way to [place].She wants you to notify them that she is on her way,

## 2013-11-17 NOTE — ED Notes (Signed)
Admitting Doctor at bedside 
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
## 2013-11-17 NOTE — ED Provider Notes (Signed)
CSN: [id number]     Arrival date & time 11/17/13  0913 History   First MD Initiated Contact with Patient 11/17/13 [phone]     Chief Complaint  Patient presents with  . Chest Pain  . Near Syncope     (Consider location/radiation/quality/duration/timing/severity/associated sxs/prior Treatment) Patient is a 80 y.o. female presenting with chest pain. The history is provided by the patient.  Chest Pain Pain location:  Substernal area Pain quality: dull and pressure   Pain radiates to:  L jaw and neck Pain radiates to the back: no   Pain severity:  Moderate Onset quality:  Sudden Duration:  1 hour Timing:  Constant Progression:  Partially resolved Chronicity:  Recurrent Context: breathing   Relieved by:  Rest and nitroglycerin Worsened by:  Nothing tried Ineffective treatments:  None tried Associated symptoms: shortness of breath   Associated symptoms: no dizziness, no fever, no headache, no nausea, no palpitations and not vomiting   Risk factors: coronary artery disease, high cholesterol and hypertension   Risk factors: no prior DVT/PE    80 yo F with a chief complaint chest pain. This is an ongoing problem. Patient with a history of an MI 13 years ago. Patient was found to have a 70% stenosis of the LAD. This study was performed at that time. Patient with multiple caths since then and also showing the same amount of stenosis. Patient was recently seen at Dr. Lysbeth Penner office had any normal resting echo as well as an unremarkable exercise tolerance test. Patient was scheduled for an outpatient nuclear medicine perfusion study. This morning while the patient was eating breakfast she had sudden sternal chest pain. This radiated up to her left neck was associated with diaphoresis shortness of breath or nausea. Patient states this feels just like when the last time she had an MI.   Past Medical History  Diagnosis Date  . ALLERGIC RHINITIS   . Asthma   . Cough   . Acute myocardial infarction,  unspecified site, episode of care unspecified   . Dysfunction of eustachian tube   . Acquired absence of breast and nipple   . Esophageal reflux   . Coronary atherosclerosis   . PVC's (premature ventricular contractions)   . Hypertension   . Hyperlipidemia   . Breast cancer     left mastectomy   Past Surgical History  Procedure Laterality Date  . Abdominal hysterectomy  1967  . Dilation and curettage of uterus  1962-1968    x4  . Gallbladder surgery  1985  . Mastectomy Left 1981  . Placement of breast implants  1993    [facility]  . Myomectomy  1968  . Transthoracic echocardiogram  2014    EF 96-29%, grade 1 diastolic dysfunction; mildly thickened MV leaflets, trivial regurg   . Nm myocar perf wall motion  2012    bruce myoview -no inducible ischemia, EF 71%, low risk scan  . Cardiac catheterization  03/01/1986    normal coronaries (Dr. Domenic Moras)  . Cardiac catheterization  10/25/2002    normal L main; LAD w/40% narrowing in prox 3rd and 60-70% narrowing beyond 1st diagonal, LAD was tortuous; dominant RCA with 30-40% segmental narrowing and 20-30% narrowing at junction of prox 3rd (Dr. Marella Chimes)  . Cardiac catheterization  04/11/2006    trivial luminal irregularities in coronaries and mid LAD 50% (Dr. Domenic Moras)  . Carotid doppler  2005    normal study (ordered for swelling & pain, left neck clavicle to ear)  .  Cardiopulmonary met test  05/05/2012    excellent effort w/RER 1.06, peak VO2>100%, peak HR 81%, good functional capacity   Family History  Problem Relation Age of Onset  . Heart attack Brother   . Stroke Brother   . CAD Brother     + stents  . CAD Brother     + stents  . Stroke Brother   . Lymphoma Sister   . Colon cancer Sister   . Cervical cancer Sister   . Liver cancer Brother   . Heart disease Mother   . Stroke Mother   . Colon cancer Mother 19  . Heart disease Maternal Grandmother   . Stroke Maternal Grandmother   . Heart disease Maternal Grandfather   .  Cancer Maternal Grandfather   . Heart disease Paternal Grandmother   . Stroke Paternal Grandfather    History  Substance Use Topics  . Smoking status: Never Smoker   . Smokeless tobacco: Not on file  . Alcohol Use: Not on file   OB History   Grav Para Term Preterm Abortions TAB SAB Ect Mult Living                 Review of Systems  Constitutional: Negative for fever and chills.  HENT: Negative for congestion and rhinorrhea.   Eyes: Negative for redness and visual disturbance.  Respiratory: Positive for shortness of breath. Negative for wheezing.   Cardiovascular: Positive for chest pain. Negative for palpitations.  Gastrointestinal: Negative for nausea and vomiting.  Genitourinary: Negative for dysuria and urgency.  Musculoskeletal: Negative for arthralgias and myalgias.  Skin: Negative for pallor and wound.  Neurological: Negative for dizziness and headaches.      Allergies  Codeine; Penicillins; Percodan; and Pravastatin  Home Medications   Prior to Admission medications   Medication Sig Start Date End Date Taking? Authorizing Provider  Alum Hydroxide-Mag Carbonate (GAVISCON PO) Take 1 tablet by mouth daily as needed (heartburn).    Yes Historical Provider, MD  aspirin 81 MG tablet Take 81 mg by mouth 4 (four) times a week.    Yes Historical Provider, MD  atenolol (TENORMIN) 25 MG tablet Take 25 mg by mouth daily.    Yes Historical Provider, MD  Calcium Carbonate-Vit D-Min (CALTRATE PLUS PO) Take 1 tablet by mouth daily.    Yes Historical Provider, MD  cholecalciferol (VITAMIN D) 1000 UNITS tablet Take 1,000 Units by mouth daily.   Yes Historical Provider, MD  fish oil-omega-3 fatty acids 1000 MG capsule Take 2 g by mouth daily.   Yes Historical Provider, MD  losartan (COZAAR) 25 MG tablet Take 25 mg by mouth daily.    Yes Historical Provider, MD  Multiple Vitamin (MULTIVITAMIN) tablet Take 1 tablet by mouth daily.   Yes Historical Provider, MD  nitroGLYCERIN  (NITROSTAT) 0.4 MG SL tablet Place 1 tablet (0.4 mg total) under the tongue every 5 (five) minutes as needed for chest pain. 11/12/13  Yes Pixie Casino, MD  vitamin B-12 (CYANOCOBALAMIN) 1000 MCG tablet Take 2,000 mcg by mouth daily.   Yes Historical Provider, MD   BP 117/56  Pulse 54  Temp(Src) 97.9 F (36.6 C) (Oral)  Resp 15  Ht _0  (1.626 m)  Wt 178 lb (80.74 kg)  BMI 30.54 kg/m2  SpO2 93% Physical Exam  Constitutional: She is oriented to person, place, and time. She appears well-developed and well-nourished. No distress.  HENT:  Head: Normocephalic and atraumatic.  Eyes: EOM are normal. Pupils are equal, round,  and reactive to light.  Neck: Normal range of motion. Neck supple.  Cardiovascular: Normal rate, regular rhythm and intact distal pulses.  Exam reveals no gallop and no friction rub.   No murmur heard. Pulmonary/Chest: Effort normal. She has no wheezes. She has no rales.  Abdominal: Soft. She exhibits no distension. There is no tenderness. There is no rebound.  Musculoskeletal: She exhibits no edema and no tenderness.  Neurological: She is alert and oriented to person, place, and time.  Skin: Skin is warm and dry. She is not diaphoretic.  Psychiatric: She has a normal mood and affect. Her behavior is normal.    ED Course  Procedures (including critical care time) Labs Review Labs Reviewed  BASIC METABOLIC PANEL - Abnormal; Notable for the following:    GFR calc non Af Amer 49 (*)    GFR calc Af Amer 57 (*)    All other components within normal limits  CBC  TROPONIN I  I-STAT TROPOININ, ED    Imaging Review Dg Chest 2 View  11/17/2013   CLINICAL DATA:  Chest pain and dizziness  EXAM: CHEST  2 VIEW  COMPARISON:  April 23, 2008  FINDINGS: There is a degree of underlying emphysematous change. There is no appreciable edema or consolidation. Heart is upper normal in size with pulmonary vascularity within normal limits. No adenopathy. Patient is status post left  mastectomy with surgical clips in the left axillary region. No bone lesions are identified.  IMPRESSION: Underlying emphysematous change. No edema or consolidation. Status post left mastectomy.   Electronically Signed   By: Lowella Grip M.D.   On: 11/17/2013 10:24     EKG Interpretation   Date/Time:  Tuesday November 17 2013 09:20:16 EDT Ventricular Rate:  61 PR Interval:  198 QRS Duration: 82 QT Interval:  398 QTC Calculation: 400 R Axis:   -61 Text Interpretation:  Normal sinus rhythm Left axis deviation Low voltage  QRS Cannot rule out Anteroseptal infarct , age undetermined Abnormal ECG  nonspecific T changes AVL new compared to 2008 Confirmed by GOLDSTON  MD,  SCOTT ([id number]) on 11/17/2013 9:40:04 AM      MDM   Final diagnoses:  Near syncope  Coronary atherosclerosis of unspecified type of vessel, native or graft  Essential hypertension  Old MI (myocardial infarction)  Sinus pause    80 yo F with a chief complaint of chest pain shortness of breath. Chest pain is resolved the patient continues to have shortness of breath we'll give her aspirin nitroglycerin. Consult cardiology we'll obtain troponin chest x-ray. EKG non ischemic.   Labs unremarkable cardiology consult.  Cardiology Will admit.  Deno Etienne, MD 11/17/13 1257

## 2013-11-18 ENCOUNTER — Encounter (HOSPITAL_COMMUNITY)
Admission: EM | Disposition: A | Discharge status: Home / Self Care | Payer: Self-pay | Source: Home / Self Care | Attending: Cardiology

## 2013-11-18 DIAGNOSIS — I495 Sick sinus syndrome: Secondary | ICD-10-CM

## 2013-11-18 HISTORY — PX: PERMANENT PACEMAKER INSERTION: SHX5480

## 2013-11-18 LAB — TROPONIN I: Troponin I: <0.3 ng/mL (ref <0.30)

## 2013-11-18 SURGERY — PERMANENT PACEMAKER INSERTION
Anesthesia: LOCAL

## 2013-11-18 MED ORDER — ONDANSETRON HCL 4 MG/2ML IJ SOLN: 4.0000 mg | Freq: Four times a day (QID) | INTRAMUSCULAR | Status: DC | PRN

## 2013-11-18 MED ORDER — MIDAZOLAM HCL 5 MG/5ML IJ SOLN
INTRAMUSCULAR | Status: AC
Start: 2013-11-18 — End: 2013-11-18
  Filled 2013-11-18: qty 5

## 2013-11-18 MED ORDER — SODIUM CHLORIDE 0.9 % IV SOLN: INTRAVENOUS | Status: DC

## 2013-11-18 MED ORDER — VANCOMYCIN HCL IN DEXTROSE 1-5 GM/200ML-% IV SOLN
1000.0000 mg | Freq: Two times a day (BID) | INTRAVENOUS | Status: AC
  Administered 2013-11-19: 1000 mg via INTRAVENOUS
  Filled 2013-11-18: qty 200

## 2013-11-18 MED ORDER — CHLORHEXIDINE GLUCONATE 4 % EX LIQD
60.0000 mL | Freq: Once | CUTANEOUS | Status: DC
  Filled 2013-11-18: qty 60

## 2013-11-18 MED ORDER — LIDOCAINE HCL (PF) 1 % IJ SOLN
INTRAMUSCULAR | Status: AC
Start: 2013-11-18 — End: 2013-11-18
  Filled 2013-11-18: qty 60

## 2013-11-18 MED ORDER — VANCOMYCIN HCL IN DEXTROSE 1-5 GM/200ML-% IV SOLN
1000.0000 mg | INTRAVENOUS | Status: DC
  Filled 2013-11-18: qty 200

## 2013-11-18 MED ORDER — ACETAMINOPHEN 325 MG PO TABS
325.0000 mg | ORAL_TABLET | ORAL | Status: DC | PRN
  Administered 2013-11-18: 650 mg via ORAL
  Administered 2013-11-19 (×2): 325 mg via ORAL
  Filled 2013-11-18: qty 1
  Filled 2013-11-18 (×2): qty 2

## 2013-11-18 MED ORDER — SODIUM CHLORIDE 0.9 % IR SOLN
80.0000 mg | Status: DC
  Filled 2013-11-18: qty 2

## 2013-11-18 MED ORDER — HEPARIN (PORCINE) IN NACL 2-0.9 UNIT/ML-% IJ SOLN
INTRAMUSCULAR | Status: AC
  Filled 2013-11-18: qty 1000

## 2013-11-18 MED ORDER — FENTANYL CITRATE 0.05 MG/ML IJ SOLN
INTRAMUSCULAR | Status: AC
  Filled 2013-11-18: qty 2

## 2013-11-18 NOTE — H&P (Signed)
Madison [name] is a 80 y.o. female with a h/o CAD who is admitted with recurrent presyncope. She reports that over the past 2 weeks that she has had recurrent episodes of abrupt onset of presyncope lasting several seconds. She initially attributed this to cozaar however her symptoms persisted off of this medicine. She presented to [facility] and was found to have sinus bradycardia with sinus pauses of up to 4.6 seconds.  She has a history of coronary artery disease, no prior PCI, old MI in 1987, normal ejection fraction.  She had a nuclear stress test in 2012 with no inducible ischemia (60-70% LAD lesion), EF 71%. She had cardiac catheterization in 1987, 2004, 2008.  2 weeks ago she started to have left-sided chest discomfort radiating to her jaw and neck. This occurred when she was performing physical activity in regard, trying to help out her neighbor. The pain was quite intense and she needed to lay down. Took nitroglycerin and this seemed to help. She saw Dr. Joylene Draft a few days ago and she was set up for a stress test electively next week. She was seen by Dr Marlou Porch who does not feel that cath is warranted at this time. He recommends that she have her myoview as scheduled next week. He feels that she requires beta blocker therapy long term for medical management of microvascular CAD. She has been observed at [facility] off of atenolol for more than 24 hours but with persistent sinus bradycardia and pauses.   Today, she denies symptoms of palpitations, chest pain, shortness of breath, orthopnea, PND, lower extremity edema, or neurologic sequela. The patient is tolerating medications without difficulties and is otherwise without complaint today.  Past Medical History   Diagnosis  Date   .  ALLERGIC RHINITIS    .  Asthma    .  Cough    .  Acute myocardial infarction, unspecified site, episode of care unspecified    .  Dysfunction of eustachian tube    .  Acquired absence of breast and nipple    .   Esophageal reflux    .  Coronary atherosclerosis    .  PVC's (premature ventricular contractions)    .  Hypertension    .  Hyperlipidemia    .  Breast cancer      left mastectomy   .  Syncope  11/17/2013   .  Complication of anesthesia    .  PONV (postoperative nausea and vomiting)    .  Anginal pain    .  Heart murmur    .  Peripheral vascular disease    .  Shortness of breath    .  Arthritis     Past Surgical History   Procedure  Laterality  Date   .  Abdominal hysterectomy   1967   .  Dilation and curettage of uterus   1962-1968     x4   .  Gallbladder surgery   1985   .  Placement of breast implants   1993     [facility]   .  Myomectomy   1968   .  Transthoracic echocardiogram   2014     EF 65-78%, grade 1 diastolic dysfunction; mildly thickened MV leaflets, trivial regurg   .  Nm myocar perf wall motion   2012     bruce myoview -no inducible ischemia, EF 71%, low risk scan   .  Cardiac catheterization   03/01/1986     normal coronaries (  Dr. Domenic Moras)   .  Cardiac catheterization   10/25/2002     normal L main; LAD w/40% narrowing in prox 3rd and 60-70% narrowing beyond 1st diagonal, LAD was tortuous; dominant RCA with 30-40% segmental narrowing and 20-30% narrowing at junction of prox 3rd (Dr. Marella Chimes)   .  Cardiac catheterization   04/11/2006     trivial luminal irregularities in coronaries and mid LAD 50% (Dr. Domenic Moras)   .  Carotid doppler   2005     normal study (ordered for swelling & pain, left neck clavicle to ear)   .  Cardiopulmonary met test   05/05/2012     excellent effort w/RER 1.06, peak VO2>100%, peak HR 81%, good functional capacity   .  Mastectomy  Left  1981    Current Facility-Administered Medications   Medication  Dose  Route  Frequency  Provider  Last Rate  Last Dose   .  aspirin EC tablet 81 mg  81 mg  Oral  Daily  Candee Furbish, MD     .  heparin injection 5,000 Units  5,000 Units  Subcutaneous  3 times per day  Candee Furbish, MD   5,000 Units at  11/18/13 [phone]   .  nitroGLYCERIN (NITROSTAT) SL tablet 0.4 mg  0.4 mg  Sublingual  Q5 min PRN  Deno Etienne, MD      Allergies   Allergen  Reactions   .  Codeine  Nausea And Vomiting   .  Penicillins  Other (See Comments)     Heart palpitations   .  Percodan [Oxycodone-Aspirin]  Nausea And Vomiting   .  Pravastatin  Other (See Comments)     Statins cause cramps    History    Social History   .  Marital Status:  Married     Spouse Name:  N/A     Number of Children:  1   .  Years of Education:  N/A    Occupational History   .  swim coach and physical educator     Social History Main Topics   .  Smoking status:  Never Smoker   .  Smokeless tobacco:  Never Used   .  Alcohol Use:  Yes      Comment: RARE   .  Drug Use:  No   .  Sexual Activity:  Not on file    Other Topics  Concern   .  Not on file    Social History Narrative   .  No narrative on file    Family History   Problem  Relation  Age of Onset   .  Heart attack  Brother    .  Stroke  Brother    .  CAD  Brother      + stents   .  CAD  Brother      + stents   .  Stroke  Brother    .  Lymphoma  Sister    .  Colon cancer  Sister    .  Cervical cancer  Sister    .  Liver cancer  Brother    .  Heart disease  Mother    .  Stroke  Mother    .  Colon cancer  Mother  25   .  Heart disease  Maternal Grandmother    .  Stroke  Maternal Grandmother    .  Heart disease  Maternal Grandfather    .  Cancer  Maternal Grandfather    .  Heart disease  Paternal Grandmother    .  Stroke  Paternal Grandfather     ROS- All systems are reviewed and negative except as per the HPI above  Physical Exam:  Filed Vitals:    11/17/13 1358  11/17/13 2159  11/18/13 0113  11/18/13 0731   BP:  132/96  134/72  125/65  122/82   Pulse:  56  51  81  69   Temp:  97.6 F (36.4 C)  97.4 F (36.3 C)  97.4 F (36.3 C)  97.7 F (36.5 C)   TempSrc:  Oral  Oral  Oral  Oral   Resp:  16  16  16  16    Height:  5' 4"  (1.626 m)      Weight:  177  lb 0.5 oz (80.3 kg)    174 lb 6.1 oz (79.1 kg)   SpO2:  96%  97%  97%  97%    GEN- The patient is well appearing, alert and oriented x 3 today.  Head- normocephalic, atraumatic  Eyes- Sclera clear, conjunctiva pink  Ears- hearing intact  Oropharynx- clear  Neck- supple, no JVP  Lymph- no cervical lymphadenopathy  Lungs- Clear to ausculation bilaterally, normal work of breathing  Heart- Regular rate and rhythm, no murmurs, rubs or gallops, PMI not laterally displaced  GI- soft, NT, ND, + BS  Extremities- no clubbing, cyanosis, or edema  MS- no significant deformity or atrophy  Skin- no rash or lesion  Psych- euthymic mood, full affect  Neuro- strength and sensation are intact  EKG reveals sinus bradycardia 50 bpm, first degree AV block 224 msec, incomplete RBB  Prior echo reveals preserved EF  Epic records including Dr Marlou Porch notes are reviewed  Assessment and Plan:  1. Sick sinus syndrome  The patient has symptomatic sinus bradycardia with symptomatic pauses of > 4 seconds. These persists off of atenolol > 24 hours. Dr Marlou Porch feels that she will require atenolol long term for cad management. I would therefore recommend pacemaker implantation at this time. Risks, benefits, alternatives to pacemaker implantation were discussed in detail with the patient today. The patient understands that the risks include but are not limited to bleeding, infection, pneumothorax, perforation, tamponade, vascular damage, renal failure, MI, stroke, death, and lead dislodgement and wishes to proceed. We will therefore schedule the procedure at the next available time.  2. Cad  I have discussed with Dr Marlou Porch who feels that she should have her elective myoview next week as scheduled. He does not feel that inpatient CV risk stratification is required. She has negative troponin and no ekg changes. I think that ischemia is not likely the cause for her sick sinus syndrome   Cristopher Peru

## 2013-11-18 NOTE — Consult Note (Signed)
Primary Care Physician: Jerlyn Ly, MD Referring Physician:  Dr Noemi Chapel is a 80 y.o. female with a h/o CAD who is admitted with recurrent presyncope.  She reports that over the past 2 weeks that she has had recurrent episodes of abrupt onset of presyncope lasting several seconds.  She initially attributed this to cozaar however her symptoms persisted off of this medicine.   She presented to [facility] ER and was found to have sinus bradycardia with sinus pauses of up to 4.6 seconds.   She has a history of coronary artery disease, no prior PCI, old MI in 1987, normal ejection fraction.    She had a nuclear stress test in 2012 with no inducible ischemia (60-70% LAD lesion), EF 71%. She had cardiac catheterization in 1987, 2004, 2008.  2 weeks ago she started to have left-sided chest discomfort radiating to her jaw and neck. This occurred when she was performing physical activity in regard, trying to help out her neighbor. The pain was quite intense and she needed to lay down. Took nitroglycerin and this seemed to help. She saw Dr. Joylene Draft a few days ago and she was set up for a stress test electively next week.  She was seen by Dr Marlou Porch who does not feel that cath is warranted at this time.  He recommends that she have her myoview as scheduled next week.  He feels that she requires beta blocker therapy long term for medical management of microvascular CAD.  She has been observed at [facility] off of atenolol for more than 24 hours but with persistent sinus bradycardia and pauses.  Today, she denies symptoms of palpitations, chest pain, shortness of breath, orthopnea, PND, lower extremity edema, or neurologic sequela. The patient is tolerating medications without difficulties and is otherwise without complaint today.   Past Medical History  Diagnosis Date  . ALLERGIC RHINITIS   . Asthma   . Cough   . Acute myocardial infarction, unspecified site, episode of care unspecified   .  Dysfunction of eustachian tube   . Acquired absence of breast and nipple   . Esophageal reflux   . Coronary atherosclerosis   . PVC's (premature ventricular contractions)   . Hypertension   . Hyperlipidemia   . Breast cancer     left mastectomy  . Syncope 11/17/2013  . Complication of anesthesia   . PONV (postoperative nausea and vomiting)   . Anginal pain   . Heart murmur   . Peripheral vascular disease   . Shortness of breath   . Arthritis    Past Surgical History  Procedure Laterality Date  . Abdominal hysterectomy  1967  . Dilation and curettage of uterus  1962-1968    x4  . Gallbladder surgery  1985  . Placement of breast implants  1993    [facility]  . Myomectomy  1968  . Transthoracic echocardiogram  2014    EF 00-93%, grade 1 diastolic dysfunction; mildly thickened MV leaflets, trivial regurg   . Nm myocar perf wall motion  2012    bruce myoview -no inducible ischemia, EF 71%, low risk scan  . Cardiac catheterization  03/01/1986    normal coronaries (Dr. Domenic Moras)  . Cardiac catheterization  10/25/2002    normal L main; LAD w/40% narrowing in prox 3rd and 60-70% narrowing beyond 1st diagonal, LAD was tortuous; dominant RCA with 30-40% segmental narrowing and 20-30% narrowing at junction of prox 3rd (Dr. Marella Chimes)  . Cardiac catheterization  04/11/2006    trivial luminal irregularities in coronaries and mid LAD 50% (Dr. Domenic Moras)  . Carotid doppler  2005    normal study (ordered for swelling & pain, left neck clavicle to ear)  . Cardiopulmonary met test  05/05/2012    excellent effort w/RER 1.06, peak VO2>100%, peak HR 81%, good functional capacity  . Mastectomy Left 1981    Current Facility-Administered Medications  Medication Dose Route Frequency Provider Last Rate Last Dose  . aspirin EC tablet 81 mg  81 mg Oral Daily Candee Furbish, MD      . heparin injection 5,000 Units  5,000 Units Subcutaneous 3 times per day Candee Furbish, MD   5,000 Units at 11/18/13 [phone]  .  nitroGLYCERIN (NITROSTAT) SL tablet 0.4 mg  0.4 mg Sublingual Q5 min PRN Deno Etienne, MD        Allergies  Allergen Reactions  . Codeine Nausea And Vomiting  . Penicillins Other (See Comments)    Heart palpitations  . Percodan [Oxycodone-Aspirin] Nausea And Vomiting  . Pravastatin Other (See Comments)    Statins cause cramps    History   Social History  . Marital Status: Married    Spouse Name: N/A    Number of Children: 1  . 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 Smoking status: Never Smoker   . Smokeless tobacco: Never Used  . Alcohol Use: Yes     Comment: RARE  . Drug Use: No  . Sexual Activity: Not on file   Other Topics Concern  . Not on file   Social History Narrative  . No narrative on file    Family History  Problem Relation Age of Onset  . Heart attack Brother   . Stroke Brother   . CAD Brother     + stents  . CAD Brother     + stents  . Stroke Brother   . Lymphoma Sister   . Colon cancer Sister   . Cervical cancer Sister   . Liver cancer Brother   . Heart disease Mother   . Stroke Mother   . Colon cancer Mother 11  . Heart disease Maternal Grandmother   . Stroke Maternal Grandmother   . Heart disease Maternal Grandfather   . Cancer Maternal Grandfather   . Heart disease Paternal Grandmother   . Stroke Paternal Grandfather     ROS- All systems are reviewed and negative except as per the HPI above  Physical Exam: Filed Vitals:   11/17/13 1358 11/17/13 2159 11/18/13 0113 11/18/13 0731  BP: 132/96 134/72 125/65 122/82  Pulse: 56 51 81 69  Temp: 97.6 F (36.4 C) 97.4 F (36.3 C) 97.4 F (36.3 C) 97.7 F (36.5 C)  TempSrc: Oral Oral Oral Oral  Resp: 16 16 16 16   Height: 5' 4"  (1.626 m)     Weight: 177 lb 0.5 oz (80.3 kg)   174 lb 6.1 oz (79.1 kg)  SpO2: 96% 97% 97% 97%    GEN- The patient is well appearing, alert and oriented x 3 today.   Head-  normocephalic, atraumatic Eyes-  Sclera clear, conjunctiva pink Ears- hearing intact Oropharynx- clear Neck- supple, no JVP Lymph- no cervical lymphadenopathy Lungs- Clear to ausculation bilaterally, normal work of breathing Heart- Regular rate and rhythm, no murmurs, rubs or gallops, PMI not laterally displaced GI- soft, NT, ND, + BS Extremities- no clubbing, cyanosis, or  edema MS- no significant deformity or atrophy Skin- no rash or lesion Psych- euthymic mood, full affect Neuro- strength and sensation are intact  EKG reveals sinus bradycardia 50 bpm, first degree AV block 224 msec, incomplete RBB Prior echo reveals preserved EF Epic records including Dr Marlou Porch notes are reviewed  Assessment and Plan:  1. Sick sinus syndrome The patient has symptomatic sinus bradycardia with symptomatic pauses of > 4 seconds.  These persists off of atenolol > 24 hours.  Dr Marlou Porch feels that she will require atenolol long term for cad management.  I would therefore recommend pacemaker implantation at this time.  Risks, benefits, alternatives to pacemaker implantation were discussed in detail with the patient today. The patient understands that the risks include but are not limited to bleeding, infection, pneumothorax, perforation, tamponade, vascular damage, renal failure, MI, stroke, death,  and lead dislodgement and wishes to proceed. We will therefore schedule the procedure at the next available time.  2. Cad I have discussed with Dr Marlou Porch who feels that she should have her elective myoview next week as scheduled.  He does not feel that inpatient CV risk stratification is required.  She has negative troponin and no ekg changes.  I think that ischemia is not likely the cause for her sick sinus syndrome

## 2013-11-18 NOTE — Care Management Note (Signed)
    Page 1 of 1   11/18/2013     3:29:59 PM CARE MANAGEMENT NOTE 11/18/2013  Patient:  Madison [name], Madison [name]   Account Number:  [id number]  Date Initiated:  11/18/2013  Documentation initiated by:  [facility]  Subjective/Objective Assessment:   80 year old female with near syncope, sinus pause, first degree AV block, non-flow-limiting coronary artery disease.//Home with spouse     Action/Plan:   Possible pacemaker placement; BB d/c'd// Access for [facility] services.   Anticipated DC Date:  11/20/2013   Anticipated DC Plan:  [place]  CM consult      Choice offered to / List presented to:             Status of service:  In process, will continue to follow Medicare Important Message given?   (If response is "NO", the following Medicare IM given date fields will be blank) Date Medicare IM given:   Medicare IM given by:   Date Additional Medicare IM given:   Additional Medicare IM given by:    Discharge Disposition:    Per UR Regulation:  Reviewed for med. necessity/level of care/duration of stay  If discussed at [place] of Stay Meetings, dates discussed:    Comments:

## 2013-11-18 NOTE — CV Procedure (Signed)
SURGEON:  Cristopher Peru, MD     PREPROCEDURE DIAGNOSIS:  Symptomatic Bradycardia due to sinus node dysfunction and intermittent mobitz 2 second degree AV block.    POSTPROCEDURE DIAGNOSIS:  Same as preprocedure diagnosis     PROCEDURES:   1. Left upper extremity venography.   2. Pacemaker implantation.     INTRODUCTION: Madison [name] is a 80 y.o. female  with a history of bradycardia who presents today for pacemaker implantation.  The patient reports intermittent episodes of dizziness and fatigue over the past few months.  She has been on a beta blocker and this was stopped but she continued to have bradycardia.  In addition she has angina and needs her beta blocker for control of chronic stable angina.  The patient therefore presents today for pacemaker implantation.     DESCRIPTION OF PROCEDURE:  Informed written consent was obtained, and   the patient was brought to the electrophysiology lab in a fasting state.  The patient required no sedation for the procedure today.  The patients right chest was prepped and draped in the usual sterile fashion by the EP lab staff. The skin overlying the left deltopectoral region was infiltrated with lidocaine for local analgesia.  A 4-cm incision was made over the left deltopectoral region.  A left subcutaneous pacemaker pocket was fashioned using a combination of sharp and blunt dissection. Electrocautery was required to assure hemostasis.    Left Upper Extremity Venography: After multiple attempts to puncture the right axillary vein were unsuccessful, a venogram of the right upper extremity was performed, which revealed a large right cephalic vein, which emptied into a large right subclavian vein.  The right axillary vein was moderate in size. The axillary vein was displaced caudally.    RA/RV Lead Placement: The right axillary vein was therefore directly visualized and cannulated.  Through the right cephalic vein, a Medtronic 5076 (serial number  [id number]) right atrial lead and a Medtronic 5076 (serial number [id number]) right ventricular lead were advanced with fluoroscopic visualization into the right atrial appendage and right ventricular apical septal positions respectively.  Initial atrial lead P- waves measured 1.6 mV with impedance of 572 ohms and a threshold of 1.1 V at 0.5 msec.  Right ventricular lead R-waves measured 15.1 mV with an impedance of 642 ohms and a threshold of 0.6 V at 0.5 msec.  Both leads were secured to the pectoralis fascia using #2-0 silk over the suture sleeves.   Device Placement:  The leads were then connected to a Medtronic Adapta L (serial number [id number]) pacemaker.  The pocket was irrigated with copious gentamicin solution.  The pacemaker was then placed into the pocket.  The pocket was then closed in 2 layers with 2.0 Vicryl suture for the subcutaneous and subcuticular layers.  Steri-Strips and a sterile dressing were then applied.  There were no early apparent complications.     CONCLUSIONS:   1. Successful implantation of a Medtronic dual-chamber pacemaker for symptomatic bradycardia due to sinus node dysfunction and intermittent Mobitz 2 second degreee AV Block.  2. No early apparent complications.           Cristopher Peru, MD 11/18/2013 6:15 PM

## 2013-11-18 NOTE — ED Provider Notes (Signed)
I saw and evaluated the patient, reviewed the resident's note and I agree with the findings and plan.   EKG Interpretation   Date/Time:  Tuesday November 17 2013 09:20:16 EDT Ventricular Rate:  61 PR Interval:  198 QRS Duration: 82 QT Interval:  398 QTC Calculation: 400 R Axis:   -61 Text Interpretation:  Normal sinus rhythm Left axis deviation Low voltage  QRS Cannot rule out Anteroseptal infarct , age undetermined Abnormal ECG  nonspecific T changes AVL new compared to 2008 Confirmed by Lamoine Magallon  MD,  [place] [phone]) on 11/17/2013 9:40:04 AM       Patient with worsening angina, now at rest. Symptoms controlled now, admit to cards.  Ephraim Hamburger, MD 11/18/13 [phone]

## 2013-11-18 NOTE — Discharge Summary (Signed)
ELECTROPHYSIOLOGY PROCEDURE DISCHARGE SUMMARY    Patient ID: Madison [name],  MRN: [id number], DOB/AGE: 1934-03-16 80 y.o.  Admit date: 11/17/2013 Discharge date: 11/18/2013  Primary Care Physician: Jerlyn Ly, MD Primary Cardiologist: Hilty Electrophysiologist: Lovena Le  Primary Discharge Diagnosis:  Symptomatic bradycardia status post pacemaker implantation this admission  Secondary Discharge Diagnosis:  1.  CAD - known 70% LAD lesion with no ischemia on most recent myoview 2.  Hypertension 3.  Hyperlipidemia 4.  Asthma 5.  Breast cancer s/p left mastectomy  Allergies  Allergen Reactions  . Codeine Nausea And Vomiting  . Penicillins Other (See Comments)    Heart palpitations  . Percodan [Oxycodone-Aspirin] Nausea And Vomiting  . Pravastatin Other (See Comments)    Statins cause cramps     Procedures This Admission:  1.  Implantation of a dual chamber pacemaker on 11-18-13 by Dr Lovena Le.  The patient received a MDT ADDRL1 pacemaker with model number [id number] right atrial and right ventricular leads.  There were no early apparent complications. 2.  CXR on 11-19-13 demonstrated no PTX.  Brief HPI/Hospital Course:  Madison [name] is a 80 y.o. female with a past medical history as outlined above.  She was admitted on 11/17/13 with pre-syncope and was found to have symptomatic bradycardia with documented pauses of >4 seconds.  Her Atenolol was held without improvement in bradycardia.  Risks, benefits, and alternatives to pacemaker implantation were reviewed with the patient who wished to proceed. The patient  underwent implantation of a MDT dual chamber pacemaker with details as outlined above.   She was monitored on telemetry overnight which demonstrated nsr with atrial pacing.  Post procedure, her incision was without hematoma or ecchymosis.  The device was interrogated and found to be functioning normally.  CXR was obtained and demonstrated no pneumothorax status post device  implantation.  Wound care, arm mobility, and restrictions were reviewed with the patient.  Dr Lovena Le examined the patient and considered them stable for discharge to home.    Discharge Vitals: Blood pressure 140/69, pulse 60, temperature 97.7 F (36.5 C), temperature source Oral, resp. rate 18, height 5\' 4"  (1.626 m), weight 174 lb 6.1 oz (79.1 kg), SpO2 95.00%.  Labs:   Lab Results  Component Value Date   WBC 5.3 11/17/2013   HGB 12.7 11/17/2013   HCT 37.1 11/17/2013   MCV 85.3 11/17/2013   PLT 185 11/17/2013    Recent Labs Lab 11/17/13 0947 11/17/13 1440  NA 139  --   K 4.3  --   CL 105  --   CO2 24  --   BUN 23  --   CREATININE 1.06 0.98  CALCIUM 9.2  --   GLUCOSE 99  --      Discharge Medications:    Medication List    ASK your doctor about these medications       aspirin 81 MG tablet  Take 81 mg by mouth 4 (four) times a week.     atenolol 25 MG tablet  Commonly known as:  TENORMIN  Take 25 mg by mouth daily.     CALTRATE PLUS PO  Take 1 tablet by mouth daily.     cholecalciferol 1000 UNITS tablet  Commonly known as:  VITAMIN D  Take 1,000 Units by mouth daily.     fish oil-omega-3 fatty acids 1000 MG capsule  Take 2 g by mouth daily.     GAVISCON PO  Take 1 tablet by mouth daily as needed (  heartburn).     losartan 25 MG tablet  Commonly known as:  COZAAR  Take 25 mg by mouth daily.     multivitamin tablet  Take 1 tablet by mouth daily.     nitroGLYCERIN 0.4 MG SL tablet  Commonly known as:  NITROSTAT  Place 1 tablet (0.4 mg total) under the tongue every 5 (five) minutes as needed for chest pain.     vitamin B-12 1000 MCG tablet  Commonly known as:  CYANOCOBALAMIN  Take 2,000 mcg by mouth daily.        Disposition:     Duration of Discharge Encounter: less than 30 minutes including physician time.  Signed,  Mikle Bosworth.D.

## 2013-11-19 ENCOUNTER — Inpatient Hospital Stay (HOSPITAL_COMMUNITY): Payer: Medicare HMO

## 2013-11-19 NOTE — Progress Notes (Signed)
Patient is discharged, DC IV, DC tele. DC instructions given, patient verbalizes understanding. All questions answered.

## 2013-11-19 NOTE — Progress Notes (Signed)
Orthopedic Tech Progress Note Patient Details:  Madison [name] 1934-02-20 [id number]  Ortho Devices Type of Ortho Device: Arm sling Ortho Device/Splint Interventions: Application   Cammer, Theodoro Parma 11/19/2013, 11:02 AM

## 2013-11-19 NOTE — Discharge Instructions (Signed)
° ° °  Supplemental Discharge Instructions for  Pacemaker/Defibrillator Patients  Activity No heavy lifting or vigorous activity with your left/right arm for 6 to 8 weeks.  Do not raise your left/right arm above your head for one week.  Gradually raise your affected arm as drawn below.                  8/29                        8/30                        8/31                        9/1 __  NO DRIVING for 1 week  ; you may begin driving on 10/28/61  WOUND CARE   Keep the wound area clean and dry.  Do not get this area wet for one week. No showers for one week; you may shower on     .   The tape/steri-strips on your wound will fall off; do not pull them off.  No bandage is needed on the site.  DO  NOT apply any creams, oils, or ointments to the wound area.   If you notice any drainage or discharge from the wound, any swelling or bruising at the site, or you develop a fever > 101? F after you are discharged home, call the office at once.  Special Instructions   You are still able to use cellular telephones; use the ear opposite the side where you have your pacemaker/defibrillator.  Avoid carrying your cellular phone near your device.   When traveling through airports, show security personnel your identification card to avoid being screened in the metal detectors.  Ask the security personnel to use the hand wand.   Avoid arc welding equipment, MRI testing (magnetic resonance imaging), TENS units (transcutaneous nerve stimulators).  Call the office for questions about other devices.   Avoid electrical appliances that are in poor condition or are not properly grounded.   Microwave ovens are safe to be near or to operate.  Additional information for defibrillator patients should your device go off:   If your device goes off ONCE and you feel fine afterward, notify the device clinic nurses.   If your device goes off ONCE and you do not feel well afterward, call 911.   If your device goes off  TWICE, call 911.   If your device goes off THREE times in one day, call 911.  DO NOT DRIVE YOURSELF OR A FAMILY MEMBER WITH A DEFIBRILLATOR TO THE HOSPITAL--CALL 911.

## 2013-11-20 ENCOUNTER — Telehealth: Payer: Self-pay | Admitting: Internal Medicine

## 2013-11-20 NOTE — Telephone Encounter (Signed)
New message     Pt had pacemaker put in last wed.  She thinks she is allergic to the tape or bandage.  The area is red and very itchy.  Can she take benadryl?

## 2013-11-20 NOTE — Telephone Encounter (Signed)
Pt put hydrocortisone cream around pocket--still has steri-strips. Pt has taken benadryl before with her current meds. I said if she has taken it before without reaction she is fine to take it again. ROV w/ device clinic 11/26/13 for full wound check.

## 2013-11-23 ENCOUNTER — Telehealth: Payer: Self-pay | Admitting: Internal Medicine

## 2013-11-23 NOTE — Telephone Encounter (Signed)
Please call, pt says she does not feel up to have a stress test on Friday. She also to let you know that she will see Dr Crissie Sickles on Thursday at 4:40. She had a pacemaker put in on last Wednesday.

## 2013-11-23 NOTE — Telephone Encounter (Signed)
Spoke with pt, she feels after everything she does not feel up to having the stress test at this time. She is tired and feels she needs to rest. She wanted to make sure dr hilty knew about the pacemaker and let him know she is not having dizziness or lightheadedness anymore. She is not sure she needs the stress test anymore. She will see the device clinic Thursday this week for wound check. Will forward for dr hilty review

## 2013-11-24 ENCOUNTER — Telehealth (HOSPITAL_COMMUNITY): Payer: Self-pay

## 2013-11-24 NOTE — Telephone Encounter (Signed)
Encounter complete. 

## 2013-11-26 ENCOUNTER — Ambulatory Visit (INDEPENDENT_AMBULATORY_CARE_PROVIDER_SITE_OTHER): Payer: Commercial Managed Care - HMO | Admitting: *Deleted

## 2013-11-26 DIAGNOSIS — I455 Other specified heart block: Secondary | ICD-10-CM

## 2013-11-26 LAB — MDC_IDC_ENUM_SESS_TYPE_INCLINIC
Battery Voltage: 2.8 V
Brady Statistic AP VP Percent: 0 %
Brady Statistic AP VS Percent: 74 %
Brady Statistic AS VP Percent: 0 %
Brady Statistic AS VS Percent: 26 %
Date Time Interrogation Session: 20150903165347
Lead Channel Pacing Threshold Amplitude: 0.5 V
Lead Channel Pacing Threshold Amplitude: 0.5 V
Lead Channel Pacing Threshold Pulse Width: 0.4 ms
Lead Channel Pacing Threshold Pulse Width: 0.4 ms
Lead Channel Sensing Intrinsic Amplitude: 11.2 mV
Lead Channel Sensing Intrinsic Amplitude: 2 mV
Lead Channel Setting Pacing Amplitude: 3.5 V
Lead Channel Setting Sensing Sensitivity: 4 mV
MDC IDC MSMT BATTERY IMPEDANCE: 100 Ohm
MDC IDC MSMT BATTERY REMAINING LONGEVITY: 132 mo
MDC IDC MSMT LEADCHNL RA IMPEDANCE VALUE: 505 Ohm
MDC IDC MSMT LEADCHNL RV IMPEDANCE VALUE: 581 Ohm
MDC IDC SET LEADCHNL RA PACING AMPLITUDE: 3.5 V
MDC IDC SET LEADCHNL RV PACING PULSEWIDTH: 0.4 ms

## 2013-11-26 NOTE — Progress Notes (Signed)
Wound check appointment. Steri-strips removed by the patient.  Wound red from reaction to the steri strips and tegaderm.  Hydrocortisone cream to area per Dr. Lovena Le.    Incision edges approximated, wound well healed. Normal device function. Thresholds, sensing, and impedances consistent with implant measurements. Device programmed at 3.5V/auto capture programmed on for extra safety margin until 3 month visit. Histogram distribution appropriate for patient and level of activity. No mode switches or high ventricular rates noted. Patient educated about wound care, arm mobility, lifting restrictions. ROV in 3 months with implanting physician.

## 2013-11-27 ENCOUNTER — Inpatient Hospital Stay (HOSPITAL_COMMUNITY): Admission: RE | Admit: 2013-11-27 | Payer: Commercial Managed Care - HMO | Source: Ambulatory Visit

## 2013-12-02 ENCOUNTER — Telehealth: Payer: Self-pay | Admitting: Cardiology

## 2013-12-02 ENCOUNTER — Encounter (HOSPITAL_COMMUNITY): Payer: Commercial Managed Care - HMO

## 2013-12-02 NOTE — Telephone Encounter (Signed)
Pt called and stated that she feels her heart rate is racing but she is used to her heart rate being in the low 50's. Pt stated that she is going to send a transmission to make sure everything ok. I instructed her that we would receive the transmission if anything is wrong we would show the MD and if nothing wrong we would not show the MD. I also instructed her how to send the transmission. She verbalized understanding.

## 2013-12-02 NOTE — Telephone Encounter (Signed)
Transmission received and normal. Pt aware.

## 2013-12-03 ENCOUNTER — Encounter: Payer: Self-pay | Admitting: Internal Medicine

## 2013-12-09 ENCOUNTER — Telehealth: Payer: Self-pay | Admitting: Internal Medicine

## 2013-12-09 NOTE — Telephone Encounter (Signed)
Thanks .Marland Kitchen Appointment with Lurena Joiner is a good idea.  Dr. Lemmie Evens

## 2013-12-09 NOTE — Telephone Encounter (Signed)
Please call,so short breath,sweats,and she feel weak. Pt thinks she might need to be seen,just had a pacemaker about 2 weeks ago.

## 2013-12-09 NOTE — Telephone Encounter (Signed)
Returned call to patient. Since seeing Dr. Debara Pickett she had pacemaker placed for presyncope, bradycardia, pauses. Was told she had a hart block. Patient today complains of shortness of breath, stating she "can't get enough air" and that she is diaphoretic with minimal exertion. Patient is concerned as she has a known blockage from a previous MI at age 80, but no intervention was done, and thinks maybe her symptoms may be from a coronary occlusion given that the electrical issue of her heart was addressed with pacemaker. Patient states she would prefer to have heart catheterization instead of nuclear stress test that was previously ordered by Dr. Debara Pickett (of which patient has a f/up stress test appmt on 9/30). She would like to get some answers (i.e. have cath) before end of month, as she has an out of [place] wedding first weekend of October and an out of town event the following weekend. She also notes that when her pacemaker kicks in she "feels electrical shocks" like "sparkles".   Patient will come in for appointment with Lurena Joiner, [place] tomorrow 9/17 @ 9am to address symptoms/concerns.   Of note, she is to do a transmission today, and last week's transmission was normal, per EPIC documentation.

## 2013-12-10 ENCOUNTER — Encounter: Payer: Self-pay | Admitting: Cardiology

## 2013-12-10 ENCOUNTER — Ambulatory Visit (INDEPENDENT_AMBULATORY_CARE_PROVIDER_SITE_OTHER): Payer: Commercial Managed Care - HMO | Admitting: Cardiology

## 2013-12-10 VITALS — BP 114/70 | HR 65 | Ht 64.0 in | Wt 175.0 lb

## 2013-12-10 DIAGNOSIS — R0989 Other specified symptoms and signs involving the circulatory and respiratory systems: Secondary | ICD-10-CM

## 2013-12-10 DIAGNOSIS — R0609 Other forms of dyspnea: Secondary | ICD-10-CM

## 2013-12-10 DIAGNOSIS — R0600 Dyspnea, unspecified: Secondary | ICD-10-CM

## 2013-12-10 DIAGNOSIS — I25119 Atherosclerotic heart disease of native coronary artery with unspecified angina pectoris: Secondary | ICD-10-CM

## 2013-12-10 DIAGNOSIS — I209 Angina pectoris, unspecified: Secondary | ICD-10-CM

## 2013-12-10 DIAGNOSIS — Z950 Presence of cardiac pacemaker: Secondary | ICD-10-CM

## 2013-12-10 DIAGNOSIS — I2510 Atherosclerotic heart disease of native coronary artery without angina pectoris: Secondary | ICD-10-CM

## 2013-12-10 DIAGNOSIS — I442 Atrioventricular block, complete: Secondary | ICD-10-CM

## 2013-12-10 NOTE — Assessment & Plan Note (Signed)
Recent adm and subsequent pacemaker implant.

## 2013-12-10 NOTE — Patient Instructions (Signed)
Gaspar Skeeters, PA-C has requested that you have a lexiscan myoview. For further information please visit [email or web address]. Please follow instruction sheet, as given.  Your physician recommends that you schedule a follow-up appointment after your stress test with Dr Debara Pickett.

## 2013-12-10 NOTE — Assessment & Plan Note (Signed)
She thinks she can feel "tiny shocks" when she thinks she is pacing.

## 2013-12-10 NOTE — Progress Notes (Signed)
12/10/2013 Madison [name]   1934/01/06  [id number]  Primary Physicia Jerlyn Ly, MD Primary Cardiologist: Dr Debara Pickett  HPI:  80 y/o female followed by several physicians in our group- Dr Melvern Banker, Dr Rex Kras, and now Dr Debara Pickett. She has had several caths as outlined. She says she had a "heart attack" in [place] in '95. But admits she did not have an angioplasty. She has had an echo and cardio pulmonary stress test last Feb 2014 that were normal. She recently presented with near syncope and had documented > 4 second pauses. She had a pacemaker which she tolerated. She is her today with vague DOE. She is concerned her "heart blockage" is the cause. It was suggested she have a Myoview but she wanted to come in and discuss this. I think she was really hoping to get another cath. Her DOE is very vague. She denies anginal sounding chest pain.    Current Outpatient Prescriptions  Medication Sig Dispense Refill  . Alum Hydroxide-Mag Carbonate (GAVISCON PO) Take 1 tablet by mouth daily as needed (heartburn).       Marland Kitchen aspirin 81 MG tablet Take 81 mg by mouth daily.       Marland Kitchen atenolol (TENORMIN) 25 MG tablet Take 25 mg by mouth daily.       . Calcium Carbonate-Vit D-Min (CALTRATE PLUS PO) Take 1 tablet by mouth daily.       . cholecalciferol (VITAMIN D) 1000 UNITS tablet Take 1,000 Units by mouth daily.      . fish oil-omega-3 fatty acids 1000 MG capsule Take 2 g by mouth daily.      . Multiple Vitamin (MULTIVITAMIN) tablet Take 1 tablet by mouth daily.      . nitroGLYCERIN (NITROSTAT) 0.4 MG SL tablet Place 1 tablet (0.4 mg total) under the tongue every 5 (five) minutes as needed for chest pain.  25 tablet  3  . vitamin B-12 (CYANOCOBALAMIN) 1000 MCG tablet Take 2,000 mcg by mouth daily.      Marland Kitchen losartan (COZAAR) 25 MG tablet Take 25 mg by mouth daily.        No current facility-administered medications for this visit.    Allergies  Allergen Reactions  . Codeine Nausea And Vomiting  . Penicillins Other (See  Comments)    Heart palpitations  . Percodan [Oxycodone-Aspirin] Nausea And Vomiting  . Pravastatin Other (See Comments)    Statins cause cramps  . Tape     History   Social History  . Marital Status: Married    Spouse Name: N/A    Number of Children: 1  . 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 Smoking status: Never Smoker   . Smokeless tobacco: Never Used  . Alcohol Use: Yes     Comment: RARE  . Drug Use: No  . Sexual Activity: Not on file   Other Topics Concern  . Not on file   Social History Narrative  . No narrative on file     Review of Systems: General: negative for chills, fever, night sweats or weight changes.  Cardiovascular: negative for chest pain, dyspnea on exertion, edema, orthopnea, palpitations, paroxysmal nocturnal dyspnea or shortness of breath Dermatological: negative for rash Respiratory: negative for cough or wheezing Urologic: negative for hematuria Abdominal: negative for nausea, vomiting, diarrhea, bright red blood per rectum, melena, or hematemesis Neurologic: negative for visual changes, syncope, or dizziness All  other systems reviewed and are otherwise negative except as noted above.    Blood pressure 114/70, pulse 65, height 5\' 4"  (1.626 m), weight 175 lb (79.379 kg).  General appearance: alert, cooperative and no distress Lungs: clear to auscultation bilaterally Heart: regular rate and rhythm Extremities: no edema  EKG A paced  ASSESSMENT AND PLAN:   CORONARY ARTERY DISEASE She has had a 75% LAD on multiple caths- '87, '95, '04, and '08.  CHB with near syncope Recent adm and subsequent pacemaker implant.  Cardiac pacemaker in situ- MDT 11/18/13 She thinks she can feel "tiny shocks" when she thinks she is pacing.   PLAN  I had a long discussion with the pt and her husband. I also reviewed her case with Dr Debara Pickett. She has agreed to a Myoview and will  follow up with Dr Debara Pickett.   Ferman Basilio KPA-C 12/10/2013 2:14 PM

## 2013-12-10 NOTE — Assessment & Plan Note (Signed)
She has had a 75% LAD on multiple caths- '87, '95, '04, and '08.

## 2013-12-17 ENCOUNTER — Telehealth: Payer: Self-pay | Admitting: *Deleted

## 2013-12-17 NOTE — Telephone Encounter (Signed)
Pt having chemical stress test tomorrow. She sent in manual transmission. Transmission shows all diagnostics normal and consistent.

## 2013-12-18 ENCOUNTER — Ambulatory Visit (HOSPITAL_COMMUNITY)
Admission: RE | Admit: 2013-12-18 | Discharge: 2013-12-18 | Disposition: A | Discharge status: Home / Self Care | Payer: Medicare HMO | Source: Ambulatory Visit | Attending: Cardiovascular Disease | Admitting: Cardiovascular Disease

## 2013-12-18 DIAGNOSIS — I25119 Atherosclerotic heart disease of native coronary artery with unspecified angina pectoris: Secondary | ICD-10-CM

## 2013-12-18 DIAGNOSIS — R55 Syncope and collapse: Secondary | ICD-10-CM | POA: Insufficient documentation

## 2013-12-18 DIAGNOSIS — I739 Peripheral vascular disease, unspecified: Secondary | ICD-10-CM | POA: Diagnosis not present

## 2013-12-18 DIAGNOSIS — Z8249 Family history of ischemic heart disease and other diseases of the circulatory system: Secondary | ICD-10-CM | POA: Insufficient documentation

## 2013-12-18 DIAGNOSIS — I10 Essential (primary) hypertension: Secondary | ICD-10-CM | POA: Diagnosis not present

## 2013-12-18 DIAGNOSIS — R0609 Other forms of dyspnea: Secondary | ICD-10-CM | POA: Insufficient documentation

## 2013-12-18 DIAGNOSIS — E669 Obesity, unspecified: Secondary | ICD-10-CM | POA: Diagnosis not present

## 2013-12-18 DIAGNOSIS — R0989 Other specified symptoms and signs involving the circulatory and respiratory systems: Secondary | ICD-10-CM | POA: Diagnosis not present

## 2013-12-18 DIAGNOSIS — R0600 Dyspnea, unspecified: Secondary | ICD-10-CM

## 2013-12-18 DIAGNOSIS — R079 Chest pain, unspecified: Secondary | ICD-10-CM | POA: Diagnosis present

## 2013-12-18 DIAGNOSIS — E785 Hyperlipidemia, unspecified: Secondary | ICD-10-CM | POA: Diagnosis not present

## 2013-12-18 MED ORDER — REGADENOSON 0.4 MG/5ML IV SOLN
0.4000 mg | Freq: Once | INTRAVENOUS | Status: AC
  Administered 2013-12-18: 0.4 mg via INTRAVENOUS

## 2013-12-18 MED ORDER — AMINOPHYLLINE 25 MG/ML IV SOLN
75.0000 mg | Freq: Once | INTRAVENOUS | Status: AC
  Administered 2013-12-18: 75 mg via INTRAVENOUS

## 2013-12-18 MED ORDER — TECHNETIUM TC 99M SESTAMIBI GENERIC - CARDIOLITE
10.4000 | Freq: Once | INTRAVENOUS | Status: AC | PRN
  Administered 2013-12-18: 10 via INTRAVENOUS

## 2013-12-18 MED ORDER — TECHNETIUM TC 99M SESTAMIBI GENERIC - CARDIOLITE
31.0000 | Freq: Once | INTRAVENOUS | Status: AC | PRN
  Administered 2013-12-18: 31 via INTRAVENOUS

## 2013-12-18 NOTE — Procedures (Addendum)
[name] [address] [place] [address] [phone]  Cardiology Nuclear Med Study  Madison [name] is a 80 y.o. female     MRN : [id number]     DOB: [birth year]  Procedure Date: 12/18/2013  Nuclear Med Background Indication for Stress Test:  Evaluation for Ischemia History:  CAD, prior MI 2002, Pacer 8/15 Cardiac Risk Factors: Family History - CAD, Hypertension, Lipids, Overweight and PVD  Symptoms:  Chest Pain, DOE, Near Syncope, SOB and Syncope   Nuclear Pre-Procedure Caffeine/Decaff Intake:  7:00pm NPO After: 5:00am   IV Site: R Antecubital  IV 0.9% NS with Angio Cath:  22g  Chest Size (in):  n/a IV Started by: Madison [name], CNMT  Height: 5\' 4"  (1.626 m)  Cup Size: 40C  BMI:  Body mass index is 30.02 kg/(m^2). Weight:  175 lb (79.379 kg)   Tech Comments:  n/a    Nuclear Med Study 1 or 2 day study: 1 day  Stress Test Type:  [place] Provider:  Lyman Bishop, MD   Resting Radionuclide: Technetium 54m Sestamibi  Resting Radionuclide Dose: 10.4 mCi   Stress Radionuclide:  Technetium 60m Sestamibi  Stress Radionuclide Dose: 31.0 mCi           Stress Protocol Rest HR:62 Stress HR: 82  Rest BP: 114/100 Stress BP:142/85  Exercise Time (min): n/a METS: n/a          Dose of Adenosine (mg):  n/a Dose of Lexiscan: 0.4 mg  Dose of Atropine (mg): n/a Dose of Dobutamine: n/a mcg/kg/min (at max HR)  Stress Test Technologist: Madison [name], CCT Nuclear Technologist: Madison [name], CNMT   Rest Procedure:  Myocardial perfusion imaging was performed at rest 45 minutes following the intravenous administration of Technetium 2m Sestamibi. Stress Procedure:  The patient received IV Lexiscan 0.4 mg over 15-seconds.  Technetium 72m Sestamibi injected IV at 30-seconds.  Patient experienced shortness of breath, head pressure, dizziness and was administered 75 mg IV Aminophylline at 5 minutes. There were no  significant changes with Lexiscan.  Quantitative spect images were obtained after a 45 minute delay.  Transient Ischemic Dilatation (Normal <1.22):  1.02  QGS EDV:  51 ml QGS ESV:  17 ml LV Ejection Fraction: 66%      Rest ECG: NSR with non-specific ST-T wave changes, old anterior MI  Stress ECG: No significant change from baseline ECG  QPS Raw Data Images:  Normal; no motion artifact; normal heart/lung ratio. Stress Images:  Normal homogeneous uptake in all areas of the myocardium. Rest Images:  Normal homogeneous uptake in all areas of the myocardium. Subtraction (SDS):  No evidence of ischemia. LV Wall Motion:  NL LV Function; NL Wall Motion  Impression Exercise Capacity:  Lexiscan with no exercise. BP Response:  Normal blood pressure response. Clinical Symptoms:  No significant symptoms noted. ECG Impression:  No significant ECG changes with Lexiscan. Comparison with Prior Nuclear Study: No previous nuclear study performed   Overall Impression:  Normal stress nuclear study.   Madison Klein, MD  12/18/2013 12:34 PM

## 2013-12-22 ENCOUNTER — Ambulatory Visit (INDEPENDENT_AMBULATORY_CARE_PROVIDER_SITE_OTHER): Payer: Commercial Managed Care - HMO | Admitting: Radiology

## 2013-12-22 DIAGNOSIS — Z23 Encounter for immunization: Secondary | ICD-10-CM

## 2013-12-23 ENCOUNTER — Encounter: Payer: Self-pay | Admitting: Internal Medicine

## 2013-12-23 ENCOUNTER — Ambulatory Visit (INDEPENDENT_AMBULATORY_CARE_PROVIDER_SITE_OTHER): Payer: Commercial Managed Care - HMO | Admitting: Internal Medicine

## 2013-12-23 ENCOUNTER — Telehealth: Payer: Self-pay | Admitting: *Deleted

## 2013-12-23 VITALS — BP 136/86 | HR 70 | Ht 64.0 in | Wt 173.8 lb

## 2013-12-23 DIAGNOSIS — Z950 Presence of cardiac pacemaker: Secondary | ICD-10-CM

## 2013-12-23 DIAGNOSIS — Z79899 Other long term (current) drug therapy: Secondary | ICD-10-CM

## 2013-12-23 DIAGNOSIS — I252 Old myocardial infarction: Secondary | ICD-10-CM

## 2013-12-23 DIAGNOSIS — I10 Essential (primary) hypertension: Secondary | ICD-10-CM

## 2013-12-23 DIAGNOSIS — I2510 Atherosclerotic heart disease of native coronary artery without angina pectoris: Secondary | ICD-10-CM

## 2013-12-23 DIAGNOSIS — E785 Hyperlipidemia, unspecified: Secondary | ICD-10-CM

## 2013-12-23 DIAGNOSIS — I442 Atrioventricular block, complete: Secondary | ICD-10-CM

## 2013-12-23 NOTE — Telephone Encounter (Signed)
Spoke with patient. Informed her she will need a lipid/liver panel in about 3 months after being on lipitor 10mg  twice weekly. She will have labs drawn by PCP at annual physical in December. Labs ordered so that patient can take lab slips with her to PCP so that he will know which labs to draw.   Patient will notify office if she is unable to tolerate the twice a week dose.
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
## 2013-12-23 NOTE — Patient Instructions (Addendum)
Your physician wants you to follow-up in: 3 months with Dr. Debara Pickett. You will receive a reminder letter in the mail two months in advance. If you don't receive a letter, please call our office to schedule the follow-up appointment.  Take atorvastatin (Lipitor) 10mg  twice weekly. ** you will need to take this for 3 months and then have fasting blood work in 3 months (after being on atorvastatin) - lipid and liver  Continue losartan   Repatha - injectable cholesterol medication   You have been referred to [place]

## 2014-01-08 ENCOUNTER — Telehealth: Payer: Self-pay | Admitting: *Deleted

## 2014-01-08 NOTE — Telephone Encounter (Signed)
Faxed orders for phase 2 cardiac rehab - no GXT required - for pacemaker on 11/18/13

## 2014-02-23 ENCOUNTER — Encounter: Payer: Self-pay | Admitting: Internal Medicine

## 2014-02-23 ENCOUNTER — Ambulatory Visit (INDEPENDENT_AMBULATORY_CARE_PROVIDER_SITE_OTHER): Payer: Commercial Managed Care - HMO | Admitting: Internal Medicine

## 2014-02-23 VITALS — BP 126/82 | HR 72 | Ht 64.0 in | Wt 175.0 lb

## 2014-02-23 DIAGNOSIS — I10 Essential (primary) hypertension: Secondary | ICD-10-CM

## 2014-02-23 DIAGNOSIS — I442 Atrioventricular block, complete: Secondary | ICD-10-CM

## 2014-02-23 DIAGNOSIS — I455 Other specified heart block: Secondary | ICD-10-CM

## 2014-02-23 DIAGNOSIS — Z950 Presence of cardiac pacemaker: Secondary | ICD-10-CM

## 2014-02-23 DIAGNOSIS — I493 Ventricular premature depolarization: Secondary | ICD-10-CM

## 2014-02-23 DIAGNOSIS — R55 Syncope and collapse: Secondary | ICD-10-CM

## 2014-02-23 LAB — MDC_IDC_ENUM_SESS_TYPE_INCLINIC
Battery Impedance: 100 Ohm
Battery Remaining Longevity: 161 mo
Battery Voltage: 2.8 V
Brady Statistic AP VS Percent: 87 %
Brady Statistic AS VP Percent: 0 %
Date Time Interrogation Session: 20151201101913
Lead Channel Impedance Value: 459 Ohm
Lead Channel Pacing Threshold Amplitude: 0.5 V
Lead Channel Pacing Threshold Pulse Width: 0.4 ms
Lead Channel Pacing Threshold Pulse Width: 0.4 ms
Lead Channel Setting Pacing Amplitude: 1.5 V
Lead Channel Setting Pacing Amplitude: 2 V
Lead Channel Setting Pacing Pulse Width: 0.4 ms
Lead Channel Setting Sensing Sensitivity: 4 mV
MDC IDC MSMT LEADCHNL RA PACING THRESHOLD AMPLITUDE: 0.5 V
MDC IDC MSMT LEADCHNL RA SENSING INTR AMPL: 2 mV
MDC IDC MSMT LEADCHNL RV IMPEDANCE VALUE: 588 Ohm
MDC IDC MSMT LEADCHNL RV SENSING INTR AMPL: 11.2 mV
MDC IDC STAT BRADY AP VP PERCENT: 0 %
MDC IDC STAT BRADY AS VS PERCENT: 13 %

## 2014-02-23 NOTE — Progress Notes (Signed)
HPI Madison [name] returns today for followup. She is a pleasant 80 yo woman with symptomatic sinus node dysfunction and heart block who underwent PPM insertion approx. 3 months ago. In the interim, she has done well with no chest pain or sob. She has not had any palpitations or syncope.  Allergies  Allergen Reactions  . Tape     PAPER TAPE -Raw, itching, bleeding skin  . Codeine Nausea And Vomiting  . Penicillins Other (See Comments)    Heart palpitations  . Percodan [Oxycodone-Aspirin] Nausea And Vomiting  . Pravastatin Other (See Comments)    Statins cause cramps     Current Outpatient Prescriptions  Medication Sig Dispense Refill  . Alum Hydroxide-Mag Carbonate (GAVISCON PO) Take 1 tablet by mouth daily as needed (heartburn).     Marland Kitchen aspirin 81 MG tablet Take 81 mg by mouth daily. Pt states she takes about 3-4 tablets a week with a meal. (02/23/14)    . atenolol (TENORMIN) 25 MG tablet Take 25 mg by mouth daily.     . Calcium Carbonate-Vit D-Min (CALTRATE PLUS PO) Take 1 tablet by mouth daily.     . cholecalciferol (VITAMIN D) 1000 UNITS tablet Take 1,000 Units by mouth daily.    . fish oil-omega-3 fatty acids 1000 MG capsule Take 2 g by mouth every other day.     . losartan (COZAAR) 25 MG tablet Take 25 mg by mouth daily.     . Multiple Vitamin (MULTIVITAMIN) tablet Take 1 tablet by mouth daily.    . nitroGLYCERIN (NITROSTAT) 0.4 MG SL tablet Place 1 tablet (0.4 mg total) under the tongue every 5 (five) minutes as needed for chest pain. (Patient taking differently: Place 0.4 mg under the tongue every 5 (five) minutes as needed for chest pain (MAX 3 TABLETS). ) 25 tablet 3  . vitamin B-12 (CYANOCOBALAMIN) 1000 MCG tablet Take 2,000 mcg by mouth daily.     No current facility-administered medications for this visit.     Past Medical History  Diagnosis Date  . ALLERGIC RHINITIS   . Asthma   . Cough   . Acute myocardial infarction, unspecified site, episode of care  unspecified   . Dysfunction of eustachian tube   . Acquired absence of breast and nipple   . Esophageal reflux   . Coronary atherosclerosis   . PVC's (premature ventricular contractions)   . Hypertension   . Hyperlipidemia   . Breast cancer     left mastectomy  . Syncope 11/17/2013  . Complication of anesthesia   . PONV (postoperative nausea and vomiting)   . Anginal pain   . Heart murmur   . Peripheral vascular disease   . Shortness of breath   . Arthritis     ROS:   All systems reviewed and negative except as noted in the HPI.   Past Surgical History  Procedure Laterality Date  . Abdominal hysterectomy  1967  . Dilation and curettage of uterus  1962-1968    x4  . Gallbladder surgery  1985  . Placement of breast implants  1993    [facility]  . Myomectomy  1968  . Transthoracic echocardiogram  2014    EF 76-22%, grade 1 diastolic dysfunction; mildly thickened MV leaflets, trivial regurg   . Nm myocar perf wall motion  2012    bruce myoview -no inducible ischemia, EF 71%, low risk scan  . Cardiac catheterization  03/01/1986    normal coronaries (Dr. Domenic Moras)  .  Cardiac catheterization  10/25/2002    normal L main; LAD w/40% narrowing in prox 3rd and 60-70% narrowing beyond 1st diagonal, LAD was tortuous; dominant RCA with 30-40% segmental narrowing and 20-30% narrowing at junction of prox 3rd (Dr. Marella Chimes)  . Cardiac catheterization  04/11/2006    trivial luminal irregularities in coronaries and mid LAD 50% (Dr. Domenic Moras)  . Carotid doppler  2005    normal study (ordered for swelling & pain, left neck clavicle to ear)  . Cardiopulmonary met test  05/05/2012    excellent effort w/RER 1.06, peak VO2>100%, peak HR 81%, good functional capacity  . Mastectomy Left 1981     Family History  Problem Relation Age of Onset  . Heart attack Brother   . Stroke Brother   . CAD Brother     + stents  . CAD Brother     + stents  . Stroke Brother   . Lymphoma Sister   . Colon  cancer Sister   . Cervical cancer Sister   . Liver cancer Brother   . Heart disease Mother   . Stroke Mother   . Colon cancer Mother 13  . Heart disease Maternal Grandmother   . Stroke Maternal Grandmother   . Heart disease Maternal Grandfather   . Cancer Maternal Grandfather   . Heart disease Paternal Grandmother   . Stroke Paternal Grandfather      History   Social History  . Marital Status: Married    Spouse Name: N/A    Number of Children: 1  . 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 Smoking status: Never Smoker   . Smokeless tobacco: Never Used  . Alcohol Use: Yes     Comment: RARE  . Drug Use: No  . Sexual Activity: Not on file   Other Topics Concern  . Not on file   Social History Narrative     BP 126/82 mmHg  Pulse 72  Ht _0  (1.626 m)  Wt 175 lb (79.379 kg)  BMI 30.02 kg/m2  Physical Exam:  Well appearing 80 yo woman, NAD HEENT: Unremarkable Neck:  No JVD, no thyromegally Lymphatics:  No adenopathy Back:  No CVA tenderness Lungs:  Clear with no wheezes HEART:  Regular rate rhythm, no murmurs, no rubs, no clicks Abd:  soft, positive bowel sounds, no organomegally, no rebound, no guarding Ext:  2 plus pulses, no edema, no cyanosis, no clubbing Skin:  No rashes no nodules Neuro:  CN II through XII intact, motor grossly intact   DEVICE  Normal device function.  See PaceArt for details.   Assess/Plan:

## 2014-02-23 NOTE — Assessment & Plan Note (Signed)
Her blood pressure is well controlled. She will continue her current meds, maintain an active lifestyle and low sodium diet.

## 2014-02-23 NOTE — Assessment & Plan Note (Signed)
PM interogation demonstrates that she is having very rare PVC's. She is asymptomatic. No change in meds.

## 2014-02-23 NOTE — Patient Instructions (Signed)
Your physician wants you to follow-up in: 9 months with Dr. Knox Saliva will receive a reminder letter in the mail two months in advance. If you don't receive a letter, please call our office to schedule the follow-up appointment.  Remote monitoring is used to monitor your Pacemaker or CD from home. This monitoring reduces the number of office visits required to check your device to one time per year. It allows [place] to keep an eye on the functioning of your device to ensure it is working properly. You are scheduled for a device check from home on 05/27/14. You may send your transmission at any time that day. If you have a wireless device, the transmission will be sent automatically. After your physician reviews your transmission, you will receive a postcard with your next transmission date.

## 2014-02-23 NOTE — Assessment & Plan Note (Signed)
Her Medtr[employer] DDD PM is working normally. Will recheck in several months. 

## 2014-03-04 ENCOUNTER — Encounter (HOSPITAL_COMMUNITY): Payer: Self-pay | Admitting: Internal Medicine

## 2014-03-12 ENCOUNTER — Ambulatory Visit: Payer: Commercial Managed Care - HMO | Admitting: Internal Medicine

## 2014-04-05 ENCOUNTER — Encounter: Payer: Self-pay | Admitting: Internal Medicine

## 2014-04-05 ENCOUNTER — Ambulatory Visit (INDEPENDENT_AMBULATORY_CARE_PROVIDER_SITE_OTHER): Payer: Commercial Managed Care - HMO | Admitting: Internal Medicine

## 2014-04-05 VITALS — BP 119/80 | HR 81 | Ht 64.0 in | Wt 177.0 lb

## 2014-04-05 DIAGNOSIS — R001 Bradycardia, unspecified: Secondary | ICD-10-CM

## 2014-04-05 DIAGNOSIS — E785 Hyperlipidemia, unspecified: Secondary | ICD-10-CM

## 2014-04-05 DIAGNOSIS — R0600 Dyspnea, unspecified: Secondary | ICD-10-CM

## 2014-04-05 NOTE — Patient Instructions (Signed)
Your physician wants you to follow-up at the end of the summer--August or September. You will receive a reminder letter in the mail two months in advance. If you don't receive a letter, please call our office to schedule the follow-up appointment.  You have been referred to Cardiac Rehab at [facility].  We will make referral.
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
## 2014-04-06 ENCOUNTER — Telehealth (HOSPITAL_COMMUNITY): Payer: Self-pay | Admitting: Cardiac Rehabilitation

## 2014-04-06 NOTE — Telephone Encounter (Signed)
pc to pt to discuss cardiac maintenance program.  Unfortunately pt cardiac diagnosis does not qualify for phase II cardiac rehab.  Pt offered cardiac maintenance program.  Pt declined due to cost.  Pt plans to continue exercising at the [facility].  Dr. Harrington Challenger notified.

## 2014-05-07 DIAGNOSIS — K589 Irritable bowel syndrome without diarrhea: Secondary | ICD-10-CM | POA: Insufficient documentation

## 2014-05-07 DIAGNOSIS — Z9189 Other specified personal risk factors, not elsewhere classified: Secondary | ICD-10-CM | POA: Insufficient documentation

## 2014-05-26 ENCOUNTER — Telehealth: Payer: Self-pay | Admitting: Internal Medicine

## 2014-05-26 NOTE — Telephone Encounter (Signed)
New Msg        Pt is traveling right now, will miss transmission for tomorrow.   Pt will be back Sunday and will send transmission Monday. If any concerns please call pt husband cell [phone].

## 2014-05-26 NOTE — Telephone Encounter (Signed)
Rescheduled transmission to 05-31-14 due to pt traveling//kwm

## 2014-05-28 ENCOUNTER — Encounter: Payer: Self-pay | Admitting: Cardiology

## 2014-05-31 ENCOUNTER — Ambulatory Visit (INDEPENDENT_AMBULATORY_CARE_PROVIDER_SITE_OTHER): Payer: Commercial Managed Care - HMO | Admitting: *Deleted

## 2014-05-31 DIAGNOSIS — R001 Bradycardia, unspecified: Secondary | ICD-10-CM

## 2014-05-31 NOTE — Progress Notes (Signed)
Remote pacemaker transmission.   

## 2014-06-02 LAB — MDC_IDC_ENUM_SESS_TYPE_REMOTE
Battery Impedance: 100 Ohm
Battery Remaining Longevity: 162 mo
Battery Voltage: 2.8 V
Brady Statistic AP VP Percent: 0 %
Brady Statistic AP VS Percent: 85 %
Brady Statistic AS VP Percent: 0 %
Brady Statistic AS VS Percent: 15 %
Date Time Interrogation Session: 20160307131012
Lead Channel Impedance Value: 626 Ohm
Lead Channel Pacing Threshold Amplitude: 0.625 V
Lead Channel Pacing Threshold Amplitude: 0.625 V
Lead Channel Pacing Threshold Pulse Width: 0.4 ms
Lead Channel Sensing Intrinsic Amplitude: 11.2 mV
Lead Channel Setting Sensing Sensitivity: 5.6 mV
MDC IDC MSMT LEADCHNL RA IMPEDANCE VALUE: 506 Ohm
MDC IDC MSMT LEADCHNL RV PACING THRESHOLD PULSEWIDTH: 0.4 ms
MDC IDC SET LEADCHNL RA PACING AMPLITUDE: 1.5 V
MDC IDC SET LEADCHNL RV PACING AMPLITUDE: 2 V
MDC IDC SET LEADCHNL RV PACING PULSEWIDTH: 0.4 ms

## 2014-06-04 ENCOUNTER — Telehealth: Payer: Self-pay | Admitting: Internal Medicine

## 2014-06-04 NOTE — Telephone Encounter (Signed)
New Msg       Pt concerned that transmission wasn't received.   States she sent it on later date than normal but then received a letter.   Please call.

## 2014-06-04 NOTE — Telephone Encounter (Signed)
LMOVM informing pt that transmission was received.  

## 2014-06-10 ENCOUNTER — Encounter: Payer: Self-pay | Admitting: Cardiology

## 2014-06-14 ENCOUNTER — Telehealth: Payer: Self-pay | Admitting: Internal Medicine

## 2014-06-14 MED ORDER — PREDNISONE 10 MG PO TABS: ORAL_TABLET | ORAL | Status: DC

## 2014-06-14 MED ORDER — AZITHROMYCIN 250 MG PO TABS
250.0000 mg | ORAL_TABLET | Freq: Once | ORAL | Status: DC
Start: 2014-06-14 — End: 2014-06-23

## 2014-06-14 NOTE — Telephone Encounter (Signed)
Consistent cough day and night, has asthma and seasonal allergies.  Lot of chest congestion, worried Madison [name] may have bronchitis.  Thick white phlegm.  Not running fever.  Patient says that her insurance company requires her to get referral from Madison [name] office before Madison [name] can schedule appointment with Madison [name].  Madison [name] called Madison [name] office and said that Madison [name] from his office is working on referral so Madison [name] can come in.  Patient would like to know if there is anything that can be done to help her until Madison [name] can come in to see Madison [name], Madison [name] is feeling really bad.  Current Outpatient Prescriptions on File Prior to Visit  Medication Sig Dispense Refill  . Alum Hydroxide-Mag Carbonate (GAVISCON PO) Take 1 tablet by mouth daily as needed (heartburn).     Marland Kitchen aspirin 81 MG tablet Take 81 mg by mouth daily. Pt states Madison [name] takes about 3-4 tablets a week with a meal. (02/23/14)    . atenolol (TENORMIN) 25 MG tablet Take 25 mg by mouth daily.     . Calcium Carbonate-Vit D-Min (CALTRATE PLUS PO) Take 1 tablet by mouth daily.     . cholecalciferol (VITAMIN D) 1000 UNITS tablet Take 1,000 Units by mouth daily.    . fish oil-omega-3 fatty acids 1000 MG capsule Take 2 g by mouth every other day.     . losartan (COZAAR) 25 MG tablet Take 25 mg by mouth daily.     . Multiple Vitamin (MULTIVITAMIN) tablet Take 1 tablet by mouth daily.    . nitroGLYCERIN (NITROSTAT) 0.4 MG SL tablet Place 1 tablet (0.4 mg total) under the tongue every 5 (five) minutes as needed for chest pain. (Patient taking differently: Place 0.4 mg under the tongue every 5 (five) minutes as needed for chest pain (MAX 3 TABLETS). ) 25 tablet 3  . Pitavastatin Calcium (LIVALO) 4 MG TABS Take by mouth. Take half tablet three days per week    . vitamin B-12 (CYANOCOBALAMIN) 1000 MCG tablet Take 2,000 mcg by mouth daily.     No current facility-administered medications on file prior to visit.   Allergies  Allergen Reactions  . Tape     PAPER TAPE  -Raw, itching, bleeding skin  . Codeine Nausea And Vomiting  . Penicillins Other (See Comments)    Heart palpitations  . Percodan [Oxycodone-Aspirin] Nausea And Vomiting  . Pravastatin Other (See Comments)    Statins cause cramps

## 2014-06-14 NOTE — Telephone Encounter (Signed)
Rx sent to pharmacy. Patient notified. Nothing further needed.  

## 2014-06-14 NOTE — Telephone Encounter (Signed)
Have patient let us know when the referral is taken care of from Perini's office. Also, in the meantime lets send patient Zpak #1 take as directed no refills and Prednisone 10 mg #20 take 4 x 2 days, 3 x 2 days, 2 x 2 days, 1 x 2 days then stop no refills. Thanks.

## 2014-06-16 ENCOUNTER — Encounter: Payer: Self-pay | Admitting: Internal Medicine

## 2014-06-23 ENCOUNTER — Encounter: Payer: Self-pay | Admitting: Internal Medicine

## 2014-06-23 ENCOUNTER — Ambulatory Visit (INDEPENDENT_AMBULATORY_CARE_PROVIDER_SITE_OTHER): Payer: Commercial Managed Care - HMO | Admitting: Internal Medicine

## 2014-06-23 VITALS — BP 122/74 | HR 64 | Ht 64.0 in | Wt 176.2 lb

## 2014-06-23 DIAGNOSIS — H9202 Otalgia, left ear: Secondary | ICD-10-CM | POA: Diagnosis not present

## 2014-06-23 DIAGNOSIS — J4521 Mild intermittent asthma with (acute) exacerbation: Secondary | ICD-10-CM | POA: Diagnosis not present

## 2014-06-23 DIAGNOSIS — J45909 Unspecified asthma, uncomplicated: Secondary | ICD-10-CM | POA: Diagnosis not present

## 2014-06-23 MED ORDER — FLUTICASONE FUROATE-VILANTEROL 100-25 MCG/INH IN AEPB: 1.0000 | INHALATION_SPRAY | Freq: Every day | RESPIRATORY_TRACT | Status: DC

## 2014-06-23 MED ORDER — LEVALBUTEROL HCL 0.63 MG/3ML IN NEBU
0.6300 mg | INHALATION_SOLUTION | Freq: Once | RESPIRATORY_TRACT | Status: AC
  Administered 2014-06-23: 0.63 mg via RESPIRATORY_TRACT

## 2014-06-23 NOTE — Assessment & Plan Note (Signed)
Recent acute wheezy bronchitis substantially responded to Z-Pak and prednisone. Plan-office spirometry, trial of Breo 100, Xopenex nebulizer treatment then watch need for extended maintenance therapy.

## 2014-06-23 NOTE — Assessment & Plan Note (Signed)
This seems largely resolved. Acute symptoms were associated with recent bronchitis and she may have had eustachian dysfunction or an otitis externa. We will watch for recurrence.

## 2014-06-23 NOTE — Progress Notes (Signed)
06/06/11- 59 yoF followed for allergic asthma, allergic rhinitis complicated by GERD, CAD/MI, hx L mastectomy LOV- 06/17/08 Z pak and Mucinex helped her through a bronchitis in mid-February. Since then mild residual congestion is slowly clearing and she denies need for further intervention. Daily saline nasal spray, humidifier in bedroom. Regularly at gym.  Discussed antihistamines vs decongestants and use as needed during Spring pollen.  06/23/14- 84 yoF followed for allergic asthma, allergic rhinitis complicated by GERD, CAD/MI/ pacemaker, hx L mastectomy Former patient-last seen 05-2011 for asthma.  She returns now concerned about increased dyspnea on exertion with steps and stairs, especially in the 10 days or so since pollen has been especially heavy. Mentions occasional shortness of breath and cough relieved by cough syrup and made worse by strong odors, pollen etc. Some wheezing. We sent prednisone and a Z-Pak last week which helped. CXR 11/19/13 IMPRESSION: 1. Interim placement of cardiac pacer. Lead tips in right atrium and right ventricle. No cardiomegaly. 2. No acute pulmonary disease. 3. Left mastectomy and axillary dissection. 4. Cholecystectomy . Electronically Signed  By: Marcello Moores Register  On: 11/19/2013 07:55  ROS-see HPI Constitutional:   No-   weight loss, night sweats, fevers, chills, fatigue, lassitude. HEENT:   No-  headaches, difficulty swallowing, tooth/dental problems, sore throat,       No-  sneezing, itching, ear ache+ left,  +nasal congestion, post nasal drip,  CV:  No-   chest pain, orthopnea, PND, swelling in lower extremities, anasarca, dizziness, palpitations Resp: +shortness of breath with exertion or at rest.              + productive cough,  + non-productive cough,  No- coughing up of blood.              No-   change in color of mucus.  No- wheezing.   Skin: No-   rash or lesions. GI:  +heartburn, indigestion, abdominal pain, nausea, vomiting, GU: No-    dysuria,  MS:  No-   joint pain or swelling.  No- decreased range of motion.  No- back pain. Neuro-     nothing unusual Psych:  No- change in mood or affect. No depression or anxiety.  No memory loss.  OBJ- Physical Exam General- Alert, Oriented, Affect-appropriate, Distress- none acute Skin- rash-none, lesions- none, excoriation- none Lymphadenopathy- none Head- atraumatic            Eyes- Gross vision intact, PERRLA, conjunctivae and secretions clear            Ears- Hearing, canals-normal            Nose- Clear, no-Septal dev, mucus, polyps, erosion, perforation             Throat- Mallampati II , mucosa clear , drainage- none, tonsils- atrophic Neck- flexible , trachea midline, no stridor , thyroid nl, carotid no bruit Chest - symmetrical excursion , unlabored           Heart/CV- RRR , no murmur , no gallop  , no rub, nl s1 s2                           - JVD- none , edema- none, stasis changes- none, varices- none           Lung- clear to P&A, wheeze- none, cough+ light , dullness-none, rub- none           Chest wall-  Abd Br/ Gen/ Rectal- Not done,  not indicated Extrem- cyanosis- none, clubbing, none, atrophy- none, strength- nl Neuro- tremor

## 2014-06-23 NOTE — Patient Instructions (Signed)
Office spirometry  Dx asthma with bronchitis  Sample Breo 100   1 puff then rinse mouth, once daily  Neb xop 0.63

## 2014-06-24 ENCOUNTER — Telehealth: Payer: Self-pay | Admitting: Internal Medicine

## 2014-06-24 NOTE — Telephone Encounter (Signed)
Notes Recorded by Deneise Lever, MD on 06/23/2014 at 4:10 PM Spirometry test showed normal airflow  I spoke with patient about results and she verbalized understanding and had no questions

## 2014-07-09 ENCOUNTER — Telehealth: Payer: Self-pay | Admitting: Internal Medicine

## 2014-07-09 DIAGNOSIS — J4521 Mild intermittent asthma with (acute) exacerbation: Secondary | ICD-10-CM

## 2014-07-09 MED ORDER — FLUTICASONE FUROATE-VILANTEROL 100-25 MCG/INH IN AEPB: 1.0000 | INHALATION_SPRAY | Freq: Every day | RESPIRATORY_TRACT | Status: DC

## 2014-07-09 MED ORDER — LEVALBUTEROL HCL 0.63 MG/3ML IN NEBU: 0.6300 mg | INHALATION_SOLUTION | Freq: Three times a day (TID) | RESPIRATORY_TRACT | Status: DC | PRN

## 2014-07-09 NOTE — Telephone Encounter (Signed)
Spoke with pt, she is aware that nebulizer order has been sent to [place].  Nothing further needed.

## 2014-07-09 NOTE — Telephone Encounter (Signed)
Order nebulizer compressor, # 1, Use as directed  Order Xopnenex 0.63% neb solution, # 75   1, three times daily as needed, by nebulizer

## 2014-07-09 NOTE — Telephone Encounter (Signed)
lmtcb X1 to make pt aware of order being placed.  Order placed for nebulizer and xopenex.

## 2014-07-09 NOTE — Telephone Encounter (Signed)
Breo 100 Rx sent to pharmacy as requested per patient.  Pt is wanting to know if she can have nebulizer and Xopenex at home--states that this was discussed at last OV. Pt feels that she would avoid having to come in as much if she had that at home to control her breathing issues when she becomes short of breath. Please advise Dr Annamaria Boots Thanks.

## 2014-08-25 ENCOUNTER — Ambulatory Visit: Payer: Commercial Managed Care - HMO | Admitting: Internal Medicine

## 2014-08-30 ENCOUNTER — Telehealth: Payer: Self-pay | Admitting: Internal Medicine

## 2014-08-30 ENCOUNTER — Ambulatory Visit (INDEPENDENT_AMBULATORY_CARE_PROVIDER_SITE_OTHER): Payer: Commercial Managed Care - HMO | Admitting: *Deleted

## 2014-08-30 DIAGNOSIS — R001 Bradycardia, unspecified: Secondary | ICD-10-CM | POA: Diagnosis not present

## 2014-08-30 NOTE — Telephone Encounter (Signed)
No restrictions or limitations. Glad to hear she joined.

## 2014-08-30 NOTE — Telephone Encounter (Signed)
New message     Patient calling working x 3 a week at [employer].  Sliver sneaker program.    Question is there any restriction or limitation .

## 2014-08-31 NOTE — Progress Notes (Signed)
Remote pacemaker transmission.   

## 2014-08-31 NOTE — Telephone Encounter (Signed)
Called patient back about Dr. Alan Ripper note, "No restrictions or limitations. Glad to hear she joined." Patient was concerned about her Pacemaker and how high her heart rate could go. Informed patient that the pacemaker is to make sure her heart rate does not go too low. Encouraged patient to stop exercising and rest if she has any chest pain, dizziness, weakness or SOB at the time. Informed patient that not to push herself to the limit, and to take it slow. Patient verbalized understanding.

## 2014-09-07 LAB — CUP PACEART REMOTE DEVICE CHECK
Battery Remaining Longevity: 163 mo
Battery Voltage: 2.79 V
Brady Statistic AP VS Percent: 85 %
Date Time Interrogation Session: 20160606130006
Lead Channel Pacing Threshold Amplitude: 0.625 V
Lead Channel Pacing Threshold Amplitude: 0.75 V
Lead Channel Pacing Threshold Pulse Width: 0.4 ms
Lead Channel Sensing Intrinsic Amplitude: 11.2 mV
Lead Channel Setting Pacing Amplitude: 1.5 V
Lead Channel Setting Pacing Pulse Width: 0.4 ms
Lead Channel Setting Sensing Sensitivity: 5.6 mV
MDC IDC MSMT BATTERY IMPEDANCE: 100 Ohm
MDC IDC MSMT LEADCHNL RA IMPEDANCE VALUE: 493 Ohm
MDC IDC MSMT LEADCHNL RA PACING THRESHOLD PULSEWIDTH: 0.4 ms
MDC IDC MSMT LEADCHNL RV IMPEDANCE VALUE: 660 Ohm
MDC IDC SET LEADCHNL RV PACING AMPLITUDE: 2 V
MDC IDC STAT BRADY AP VP PERCENT: 0 %
MDC IDC STAT BRADY AS VP PERCENT: 0 %
MDC IDC STAT BRADY AS VS PERCENT: 15 %

## 2014-09-13 ENCOUNTER — Encounter: Payer: Self-pay | Admitting: Cardiology

## 2014-09-15 ENCOUNTER — Encounter: Payer: Self-pay | Admitting: Internal Medicine

## 2014-10-27 ENCOUNTER — Observation Stay (HOSPITAL_COMMUNITY)
Admission: EM | Admit: 2014-10-27 | Discharge: 2014-10-28 | Disposition: A | Discharge status: Home / Self Care | Payer: Commercial Managed Care - HMO | Attending: Internal Medicine | Admitting: Internal Medicine

## 2014-10-27 ENCOUNTER — Emergency Department (HOSPITAL_COMMUNITY): Payer: Commercial Managed Care - HMO

## 2014-10-27 ENCOUNTER — Encounter (HOSPITAL_COMMUNITY): Payer: Self-pay | Admitting: *Deleted

## 2014-10-27 DIAGNOSIS — Z79899 Other long term (current) drug therapy: Secondary | ICD-10-CM | POA: Diagnosis not present

## 2014-10-27 DIAGNOSIS — I10 Essential (primary) hypertension: Secondary | ICD-10-CM | POA: Diagnosis present

## 2014-10-27 DIAGNOSIS — K219 Gastro-esophageal reflux disease without esophagitis: Secondary | ICD-10-CM | POA: Insufficient documentation

## 2014-10-27 DIAGNOSIS — J45909 Unspecified asthma, uncomplicated: Secondary | ICD-10-CM | POA: Diagnosis not present

## 2014-10-27 DIAGNOSIS — Z880 Allergy status to penicillin: Secondary | ICD-10-CM | POA: Insufficient documentation

## 2014-10-27 DIAGNOSIS — E663 Overweight: Secondary | ICD-10-CM | POA: Diagnosis present

## 2014-10-27 DIAGNOSIS — Z950 Presence of cardiac pacemaker: Secondary | ICD-10-CM | POA: Diagnosis not present

## 2014-10-27 DIAGNOSIS — I252 Old myocardial infarction: Secondary | ICD-10-CM | POA: Insufficient documentation

## 2014-10-27 DIAGNOSIS — Z853 Personal history of malignant neoplasm of breast: Secondary | ICD-10-CM | POA: Diagnosis not present

## 2014-10-27 DIAGNOSIS — Z888 Allergy status to other drugs, medicaments and biological substances status: Secondary | ICD-10-CM | POA: Diagnosis not present

## 2014-10-27 DIAGNOSIS — Z885 Allergy status to narcotic agent status: Secondary | ICD-10-CM | POA: Diagnosis not present

## 2014-10-27 DIAGNOSIS — M1990 Unspecified osteoarthritis, unspecified site: Secondary | ICD-10-CM | POA: Insufficient documentation

## 2014-10-27 DIAGNOSIS — G441 Vascular headache, not elsewhere classified: Secondary | ICD-10-CM | POA: Diagnosis not present

## 2014-10-27 DIAGNOSIS — R001 Bradycardia, unspecified: Secondary | ICD-10-CM | POA: Diagnosis not present

## 2014-10-27 DIAGNOSIS — I739 Peripheral vascular disease, unspecified: Secondary | ICD-10-CM | POA: Diagnosis not present

## 2014-10-27 DIAGNOSIS — R42 Dizziness and giddiness: Secondary | ICD-10-CM | POA: Insufficient documentation

## 2014-10-27 DIAGNOSIS — H409 Unspecified glaucoma: Secondary | ICD-10-CM | POA: Diagnosis not present

## 2014-10-27 DIAGNOSIS — Z7982 Long term (current) use of aspirin: Secondary | ICD-10-CM | POA: Diagnosis not present

## 2014-10-27 DIAGNOSIS — Z6825 Body mass index (BMI) 25.0-25.9, adult: Secondary | ICD-10-CM | POA: Insufficient documentation

## 2014-10-27 DIAGNOSIS — H539 Unspecified visual disturbance: Secondary | ICD-10-CM | POA: Insufficient documentation

## 2014-10-27 DIAGNOSIS — E785 Hyperlipidemia, unspecified: Secondary | ICD-10-CM | POA: Insufficient documentation

## 2014-10-27 DIAGNOSIS — I2510 Atherosclerotic heart disease of native coronary artery without angina pectoris: Secondary | ICD-10-CM | POA: Diagnosis present

## 2014-10-27 DIAGNOSIS — H5310 Unspecified subjective visual disturbances: Principal | ICD-10-CM | POA: Diagnosis present

## 2014-10-27 DIAGNOSIS — Z9012 Acquired absence of left breast and nipple: Secondary | ICD-10-CM | POA: Diagnosis not present

## 2014-10-27 DIAGNOSIS — R51 Headache: Secondary | ICD-10-CM | POA: Insufficient documentation

## 2014-10-27 DIAGNOSIS — R519 Headache, unspecified: Secondary | ICD-10-CM | POA: Diagnosis present

## 2014-10-27 LAB — URINALYSIS, ROUTINE W REFLEX MICROSCOPIC
Bilirubin Urine: NEGATIVE
Glucose, UA: NEGATIVE mg/dL
Hgb urine dipstick: NEGATIVE
KETONES UR: NEGATIVE mg/dL
Leukocytes, UA: NEGATIVE
NITRITE: NEGATIVE
PH: 6.5 (ref 5.0–8.0)
Protein, ur: NEGATIVE mg/dL
Specific Gravity, Urine: 1.011 (ref 1.005–1.030)
UROBILINOGEN UA: 0.2 mg/dL (ref 0.0–1.0)

## 2014-10-27 LAB — COMPREHENSIVE METABOLIC PANEL
ALBUMIN: 4.1 g/dL (ref 3.5–5.0)
ALT: 35 U/L (ref 14–54)
ANION GAP: 5 (ref 5–15)
AST: 33 U/L (ref 15–41)
Alkaline Phosphatase: 70 U/L (ref 38–126)
BILIRUBIN TOTAL: 0.7 mg/dL (ref 0.3–1.2)
BUN: 16 mg/dL (ref 6–20)
CALCIUM: 9.6 mg/dL (ref 8.9–10.3)
CHLORIDE: 107 mmol/L (ref 101–111)
CO2: 29 mmol/L (ref 22–32)
Creatinine, Ser: 1.09 mg/dL — ABNORMAL HIGH (ref 0.44–1.00)
GFR calc Af Amer: 54 mL/min — ABNORMAL LOW (ref >60)
GFR, EST NON AFRICAN AMERICAN: 47 mL/min — AB (ref >60)
GLUCOSE: 94 mg/dL (ref 65–99)
POTASSIUM: 4.7 mmol/L (ref 3.5–5.1)
Sodium: 141 mmol/L (ref 135–145)
Total Protein: 6.4 g/dL — ABNORMAL LOW (ref 6.5–8.1)

## 2014-10-27 LAB — CBG MONITORING, ED: GLUCOSE-CAPILLARY: 104 mg/dL — AB (ref 65–99)

## 2014-10-27 LAB — I-STAT CHEM 8, ED
BUN: 21 mg/dL — ABNORMAL HIGH (ref 6–20)
Calcium, Ion: 1.3 mmol/L (ref 1.13–1.30)
Chloride: 103 mmol/L (ref 101–111)
Creatinine, Ser: 1 mg/dL (ref 0.44–1.00)
GLUCOSE: 95 mg/dL (ref 65–99)
HCT: 42 % (ref 36.0–46.0)
HEMOGLOBIN: 14.3 g/dL (ref 12.0–15.0)
Potassium: 4.4 mmol/L (ref 3.5–5.1)
Sodium: 141 mmol/L (ref 135–145)
TCO2: 26 mmol/L (ref 0–100)

## 2014-10-27 LAB — CBC
HEMATOCRIT: 40.2 % (ref 36.0–46.0)
Hemoglobin: 13.6 g/dL (ref 12.0–15.0)
MCH: 29.8 pg (ref 26.0–34.0)
MCHC: 33.8 g/dL (ref 30.0–36.0)
MCV: 88 fL (ref 78.0–100.0)
PLATELETS: 206 10*3/uL (ref 150–400)
RBC: 4.57 MIL/uL (ref 3.87–5.11)
RDW: 12.5 % (ref 11.5–15.5)
WBC: 6.2 10*3/uL (ref 4.0–10.5)

## 2014-10-27 LAB — DIFFERENTIAL
Basophils Absolute: 0 10*3/uL (ref 0.0–0.1)
Basophils Relative: 0 % (ref 0–1)
Eosinophils Absolute: 0.2 10*3/uL (ref 0.0–0.7)
Eosinophils Relative: 3 % (ref 0–5)
LYMPHS ABS: 1.9 10*3/uL (ref 0.7–4.0)
Lymphocytes Relative: 31 % (ref 12–46)
MONOS PCT: 10 % (ref 3–12)
Monocytes Absolute: 0.6 10*3/uL (ref 0.1–1.0)
NEUTROS PCT: 56 % (ref 43–77)
Neutro Abs: 3.4 10*3/uL (ref 1.7–7.7)

## 2014-10-27 LAB — PROTIME-INR
INR: 0.96 (ref 0.00–1.49)
Prothrombin Time: 13 seconds (ref 11.6–15.2)

## 2014-10-27 LAB — I-STAT TROPONIN, ED: TROPONIN I, POC: 0 ng/mL (ref 0.00–0.08)

## 2014-10-27 LAB — APTT: APTT: 23 s — AB (ref 24–37)

## 2014-10-27 NOTE — ED Notes (Signed)
The pt has been in  [place] for 3 weeks.  Since Saturday in [place] she had a headache lt eye feels droopy seeing   Many colors and pain in the back of her head.  She did not see anyone there.  They drove in today and she came here thinking sh had a stroke.  She has also fallen several times since saturday

## 2014-10-27 NOTE — Consult Note (Signed)
Referring Physician: Linker    Chief Complaint: Visual changes  HPI: Madison [name] is an 81 y.o. female who reports that on Saturday she was walking the dog in [place] and got overheated.  When she got home she became dizzy and her visual fields changed to resemble a kaleidoscope.  This lasted for a few minutes then resolved but since that time the patient feels as if her vision from the left eye is worse.  She has also noticed that she is unable to wink her right eye independently from the left but she can open and close both eyes strongly.  Her symptoms have continued since their onset without worsening or improving.  She has also developed a left occipital headache which is unusual for her.  The patient did not seek medical attention in [place] but on returning to [place] came directly to the hospital.    Date last known well: Date: 10/23/2014 Time last known well: Time: 10:30 tPA Given: No: Outside time window  Past Medical History  Diagnosis Date  . ALLERGIC RHINITIS   . Asthma   . Cough   . Acute myocardial infarction, unspecified site, episode of care unspecified   . Dysfunction of eustachian tube   . Acquired absence of breast and nipple   . Esophageal reflux   . Coronary atherosclerosis   . PVC's (premature ventricular contractions)   . Hypertension   . Hyperlipidemia   . Breast cancer     left mastectomy  . Syncope 11/17/2013  . Complication of anesthesia   . PONV (postoperative nausea and vomiting)   . Anginal pain   . Heart murmur   . Peripheral vascular disease   . Shortness of breath   . Arthritis     Past Surgical History  Procedure Laterality Date  . Abdominal hysterectomy  1967  . Dilation and curettage of uterus  1962-1968    x4  . Gallbladder surgery  1985  . Placement of breast implants  1993    [facility]  . Myomectomy  1968  . Transthoracic echocardiogram  2014    EF 22-48%, grade 1 diastolic dysfunction; mildly thickened MV leaflets, trivial regurg   . Nm  myocar perf wall motion  2012    bruce myoview -no inducible ischemia, EF 71%, low risk scan  . Cardiac catheterization  03/01/1986    normal coronaries (Dr. Domenic Moras)  . Cardiac catheterization  10/25/2002    normal L main; LAD w/40% narrowing in prox 3rd and 60-70% narrowing beyond 1st diagonal, LAD was tortuous; dominant RCA with 30-40% segmental narrowing and 20-30% narrowing at junction of prox 3rd (Dr. Marella Chimes)  . Cardiac catheterization  04/11/2006    trivial luminal irregularities in coronaries and mid LAD 50% (Dr. Domenic Moras)  . Carotid doppler  2005    normal study (ordered for swelling & pain, left neck clavicle to ear)  . Cardiopulmonary met test  05/05/2012    excellent effort w/RER 1.06, peak VO2>100%, peak HR 81%, good functional capacity  . Mastectomy Left 1981  . Permanent pacemaker insertion N/A 11/18/2013    Procedure: PERMANENT PACEMAKER INSERTION;  Surgeon: Evans Lance, MD;  [facility]: [facility] CATH LAB;  Service: Cardiovascular;  Laterality: N/A;    Family History  Problem Relation Age of Onset  . Heart attack Brother   . Stroke Brother   . CAD Brother     + stents  . CAD Brother     + stents  . Stroke Brother   .  Lymphoma Sister   . Colon cancer Sister   . Cervical cancer Sister   . Liver cancer Brother   . Heart disease Mother   . Stroke Mother   . Colon cancer Mother 71  . Heart disease Maternal Grandmother   . Stroke Maternal Grandmother   . Heart disease Maternal Grandfather   . Cancer Maternal Grandfather   . Heart disease Paternal Grandmother   . Stroke Paternal Grandfather   . Cancer Mother   . Cancer Sister   . Cancer Brother    Social History:  reports that she has never smoked. She has never used smokeless tobacco. She reports that she drinks alcohol. She reports that she does not use illicit drugs.  Allergies:  Allergies  Allergen Reactions  . Prednisone Other (See Comments)    Keeps patient awake; feels like her throat is cramping up.  .  Tape     PAPER TAPE -Raw, itching, bleeding skin  . Codeine Nausea And Vomiting  . Penicillins Other (See Comments)    Heart palpitations  . Percodan [Oxycodone-Aspirin] Nausea And Vomiting  . Pravastatin Other (See Comments)    Statins cause cramps    Medications: I have reviewed the patient's current medications. Prior to Admission:  Prior to Admission medications   Medication Sig Start Date End Date Taking? Authorizing Provider  Alum Hydroxide-Mag Carbonate (GAVISCON PO) Take 1 tablet by mouth daily as needed (heartburn).    Yes Historical Provider, MD  aspirin 81 MG tablet Take 81 mg by mouth daily. Pt states she takes about 3-4 tablets a week with a meal. (02/23/14)   Yes Historical Provider, MD  atenolol (TENORMIN) 25 MG tablet Take 25 mg by mouth daily.    Yes Historical Provider, MD  Calcium Carbonate-Vit D-Min (CALTRATE PLUS PO) Take 1 tablet by mouth daily.    Yes Historical Provider, MD  cholecalciferol (VITAMIN D) 1000 UNITS tablet Take 1,000 Units by mouth daily.   Yes Historical Provider, MD  fish oil-omega-3 fatty acids 1000 MG capsule Take 2 g by mouth every other day.    Yes Historical Provider, MD  Fluticasone Furoate-Vilanterol (BREO ELLIPTA) 100-25 MCG/INH AEPB Inhale 1 puff into the lungs daily. Patient taking differently: Inhale 1 puff into the lungs daily. Inhale 1 puff 07/09/14  Yes Deneise Lever, MD  levalbuterol (XOPENEX) 0.63 MG/3ML nebulizer solution Take 3 mLs (0.63 mg total) by nebulization 3 (three) times daily as needed for wheezing or shortness of breath. 07/09/14  Yes Deneise Lever, MD  losartan (COZAAR) 25 MG tablet Take 25 mg by mouth daily.    Yes Historical Provider, MD  Multiple Vitamin (MULTIVITAMIN) tablet Take 1 tablet by mouth daily.   Yes Historical Provider, MD  nitroGLYCERIN (NITROSTAT) 0.4 MG SL tablet Place 1 tablet (0.4 mg total) under the tongue every 5 (five) minutes as needed for chest pain. Patient taking differently: Place 0.4 mg under  the tongue every 5 (five) minutes as needed for chest pain (MAX 3 TABLETS).  11/12/13  Yes Pixie Casino, MD  Pitavastatin Calcium (LIVALO) 4 MG TABS Take by mouth. Take half tablet three days per week   Yes Historical Provider, MD  Probiotic Product (PROBIOTIC PO) Take 1 tablet by mouth daily.   Yes Historical Provider, MD  psyllium (REGULOID) 0.52 G capsule Take 0.52 g by mouth daily.   Yes Historical Provider, MD  vitamin B-12 (CYANOCOBALAMIN) 1000 MCG tablet Take 2,000 mcg by mouth daily.   Yes Historical Provider, MD  vitamin E 400 UNIT capsule Take 400 Units by mouth daily.   Yes Historical Provider, MD    ROS: History obtained from the patient  General ROS: negative for - chills, fatigue, fever, night sweats, weight gain or weight loss Psychological ROS: negative for - behavioral disorder, hallucinations, memory difficulties, mood swings or suicidal ideation Ophthalmic ROS: negative for - blurry vision, double vision, eye pain or loss of vision ENT ROS: negative for - epistaxis, nasal discharge, oral lesions, sore throat, tinnitus or vertigo Allergy and Immunology ROS: negative for - hives or itchy/watery eyes Hematological and Lymphatic ROS: negative for - bleeding problems, bruising or swollen lymph nodes Endocrine ROS: negative for - galactorrhea, hair pattern changes, polydipsia/polyuria or temperature intolerance Respiratory ROS: negative for - cough, hemoptysis, shortness of breath or wheezing Cardiovascular ROS: negative for - chest pain, dyspnea on exertion, edema or irregular heartbeat Gastrointestinal ROS: negative for - abdominal pain, diarrhea, hematemesis, nausea/vomiting or stool incontinence Genito-Urinary ROS: negative for - dysuria, hematuria, incontinence or urinary frequency/urgency Musculoskeletal ROS: left ankle pain, left leg welling Neurological ROS: as noted in HPI Dermatological ROS: negative for rash and skin lesion changes  Physical Examination: Blood  pressure 145/66, pulse 69, temperature 97.7 F (36.5 C), temperature source Oral, resp. rate 17, height _0  (1.626 m), weight 78.019 kg (172 lb), SpO2 96 %.  HEENT-  Normocephalic, no lesions, without obvious abnormality.  Normal external eye and conjunctiva.  Normal TM's bilaterally.  Normal auditory canals and external ears. Normal external nose, mucus membranes and septum.  Normal pharynx. Cardiovascular- S1, S2 normal, pulses palpable throughout   Lungs- chest clear, no wheezing, rales, normal symmetric air entry Abdomen- soft, non-tender; bowel sounds normal; no masses,  no organomegaly Extremities- LLE edema Lymph-no adenopathy palpable Musculoskeletal-left ankle tenderness Skin-warm and dry, no hyperpigmentation, vitiligo, or suspicious lesions  Neurological Examination Mental Status: Alert, oriented, thought content appropriate.  Speech fluent without evidence of aphasia.  Able to follow 3 step commands without difficulty. Cranial Nerves: II: Discs flat bilaterally; Patient able to visualize stimuli in all visual fields.  Visual acuity 20/400 in the left eye and 20/200 in the right eye.  Pupils equal, round, reactive to light and accommodation III,IV, VI: ptosis not present, extra-ocular motions intact bilaterally V,VII: smile symmetric, facial light touch sensation normal bilaterally VIII: hearing normal bilaterally IX,X: gag reflex present XI: bilateral shoulder shrug XII: midline tongue extension Motor: Right : Upper extremity   5/5    Left:     Upper extremity   5/5  Lower extremity   5/5     Lower extremity   5/5 Tone and bulk:normal tone throughout; no atrophy noted Sensory: Pinprick and light touch intact throughout, bilaterally Deep Tendon Reflexes: 2+ and symmetric throughout Plantars: Right: downgoing   Left: downgoing Cerebellar: normal finger-to-nose and normal heel-to-shin testing bilaterally Gait: not tested due to visual issues   Laboratory Studies:   Basic Metabolic Panel:  Recent Labs Lab 10/27/14 1700 10/27/14 1721  NA 141 141  K 4.7 4.4  CL 107 103  CO2 29  --   GLUCOSE 94 95  BUN 16 21*  CREATININE 1.09* 1.00  CALCIUM 9.6  --     Liver Function Tests:  Recent Labs Lab 10/27/14 1700  AST 33  ALT 35  ALKPHOS 70  BILITOT 0.7  PROT 6.4*  ALBUMIN 4.1   No results for input(s): LIPASE, AMYLASE in the last 168 hours. No results for input(s): AMMONIA in the last 168 hours.  CBC:  Recent Labs Lab 10/27/14 1700 10/27/14 1721  WBC 6.2  --   NEUTROABS 3.4  --   HGB 13.6 14.3  HCT 40.2 42.0  MCV 88.0  --   PLT 206  --     Cardiac Enzymes: No results for input(s): CKTOTAL, CKMB, CKMBINDEX, TROPONINI in the last 168 hours.  BNP: Invalid input(s): POCBNP  CBG:  Recent Labs Lab 10/27/14 1710  GLUCAP 104*    Microbiology: No results found for this or any previous visit.  Coagulation Studies:  Recent Labs  10/27/14 1700  LABPROT 13.0  INR 0.96    Urinalysis:  Recent Labs Lab 10/27/14 1621  COLORURINE YELLOW  LABSPEC 1.011  PHURINE 6.5  GLUCOSEU NEGATIVE  HGBUR NEGATIVE  BILIRUBINUR NEGATIVE  KETONESUR NEGATIVE  PROTEINUR NEGATIVE  UROBILINOGEN 0.2  NITRITE NEGATIVE  LEUKOCYTESUR NEGATIVE    Lipid Panel: No results found for: CHOL, TRIG, HDL, CHOLHDL, VLDL, LDLCALC  HgbA1C: No results found for: HGBA1C  Urine Drug Screen:  No results found for: LABOPIA, COCAINSCRNUR, LABBENZ, AMPHETMU, THCU, LABBARB  Alcohol Level: No results for input(s): ETH in the last 168 hours.  Other results: EKG: sinus rhythm at 63 bpm, first degree AV block.  Imaging: Ct Head Wo Contrast  10/27/2014   CLINICAL DATA:  Dizziness with posterior headache 4 days. Difficulty with vision from left eye.  EXAM: CT HEAD WITHOUT CONTRAST  TECHNIQUE: Contiguous axial images were obtained from the base of the skull through the vertex without intravenous contrast.  COMPARISON:  None.  FINDINGS: Ventricles, cisterns  and other CSF spaces are within normal. There is no mass, mass effect, shift of midline structures or acute hemorrhage. No evidence of acute infarction. Remaining bones and soft tissues are within normal.  IMPRESSION: No acute intracranial findings.   Electronically Signed   By: Marin Olp M.D.   On: 10/27/2014 21:15    Assessment: 81 y.o. female presenting with visual changes.  Head CT independently reviewed and shows no acute changes.  Unable to rule a small acute infarct with this study but patient unable to have a MRI of the brain due to her pacemaker.  Patient also with headache.  Temporal arteritis remains in the differential as well.  Patient currently stable.  Further work up indicated.    Stroke Risk Factors - hyperlipidemia and hypertension  Plan: 1. HgbA1c, fasting lipid panel, ESR, CRP 2. CTA  of the head and neck 3. Echocardiogram 4. Carotid dopplers 5. Prophylactic therapy-Continue ASA 6. NPO until RN stroke swallow screen 7. Telemetry monitoring 8. Frequent neuro checks 9. Ophthalmology consult  Case discussed with Dr. Deloris Ping, MD [facility] [phone] 10/27/2014, 11:44 PM

## 2014-10-27 NOTE — ED Provider Notes (Signed)
CSN: [id number]     Arrival date & time 10/27/14  1617 History   First MD Initiated Contact with Patient 10/27/14 1840     Chief Complaint  Patient presents with  . Headache     (Consider location/radiation/quality/duration/timing/severity/associated sxs/prior Treatment) HPI  81 y.o. female who reports that on 4 days ago she was walking the dog in [place] and got overheated. When she got home she became dizzy and she saw bright colors in her left eye similar to a  kaleidoscope. This lasted for a few minutes then resolved but since that time the patient feels as if her vision from the left eye is worse. She has also noticed that she is unable to wink her right eye independently from the left but she can open and close both eyes.Marland Kitchen symptoms are continous, initially improved now stable. She has also developed a left occipital headache which she describes as a pressure sensatio in her head. . The patient did not seek medical attention in [place] but on returning to [place] came directly to the hospital. no hx of similar symptoms. There are no other associated systemic symptoms, there are no other alleviating or modifying factors.   Past Medical History  Diagnosis Date  . ALLERGIC RHINITIS   . Asthma   . Cough   . Acute myocardial infarction, unspecified site, episode of care unspecified   . Dysfunction of eustachian tube   . Acquired absence of breast and nipple   . Esophageal reflux   . Coronary atherosclerosis   . PVC's (premature ventricular contractions)   . Hypertension   . Hyperlipidemia   . Breast cancer     left mastectomy  . Syncope 11/17/2013  . Complication of anesthesia   . PONV (postoperative nausea and vomiting)   . Anginal pain   . Heart murmur   . Peripheral vascular disease   . Shortness of breath   . Arthritis    Past Surgical History  Procedure Laterality Date  . Abdominal hysterectomy  1967  . Dilation and curettage of uterus  1962-1968    x4  . Gallbladder  surgery  1985  . Placement of breast implants  1993    [facility]  . Myomectomy  1968  . Transthoracic echocardiogram  2014    EF 08-14%, grade 1 diastolic dysfunction; mildly thickened MV leaflets, trivial regurg   . Nm myocar perf wall motion  2012    bruce myoview -no inducible ischemia, EF 71%, low risk scan  . Cardiac catheterization  03/01/1986    normal coronaries (Dr. Domenic Moras)  . Cardiac catheterization  10/25/2002    normal L main; LAD w/40% narrowing in prox 3rd and 60-70% narrowing beyond 1st diagonal, LAD was tortuous; dominant RCA with 30-40% segmental narrowing and 20-30% narrowing at junction of prox 3rd (Dr. Marella Chimes)  . Cardiac catheterization  04/11/2006    trivial luminal irregularities in coronaries and mid LAD 50% (Dr. Domenic Moras)  . Carotid doppler  2005    normal study (ordered for swelling & pain, left neck clavicle to ear)  . Cardiopulmonary met test  05/05/2012    excellent effort w/RER 1.06, peak VO2>100%, peak HR 81%, good functional capacity  . Mastectomy Left 1981  . Permanent pacemaker insertion N/A 11/18/2013    Procedure: PERMANENT PACEMAKER INSERTION;  Surgeon: Evans Lance, MD;  Location: [facility] CATH LAB;  Service: Cardiovascular;  Laterality: N/A;   Family History  Problem Relation Age of Onset  . Heart attack Brother   .  Stroke Brother   . CAD Brother     + stents  . CAD Brother     + stents  . Stroke Brother   . Lymphoma Sister   . Colon cancer Sister   . Cervical cancer Sister   . Liver cancer Brother   . Heart disease Mother   . Stroke Mother   . Colon cancer Mother 70  . Heart disease Maternal Grandmother   . Stroke Maternal Grandmother   . Heart disease Maternal Grandfather   . Cancer Maternal Grandfather   . Heart disease Paternal Grandmother   . Stroke Paternal Grandfather   . Cancer Mother   . Cancer Sister   . Cancer Brother    History  Substance Use Topics  . Smoking status: Never Smoker   . Smokeless tobacco: Never Used  .  Alcohol Use: Yes     Comment: RARE   OB History    No data available     Review of Systems  ROS reviewed and all otherwise negative except for mentioned in HPI    Allergies  Prednisone; Tape; Codeine; Penicillins; Percodan; and Pravastatin  Home Medications   Prior to Admission medications   Medication Sig Start Date End Date Taking? Authorizing Provider  Alum Hydroxide-Mag Carbonate (GAVISCON PO) Take 1 tablet by mouth daily as needed (heartburn).    Yes Historical Provider, MD  aspirin 81 MG tablet Take 81 mg by mouth daily. Pt states she takes about 3-4 tablets a week with a meal. (02/23/14)   Yes Historical Provider, MD  atenolol (TENORMIN) 25 MG tablet Take 25 mg by mouth daily.    Yes Historical Provider, MD  Calcium Carbonate-Vit D-Min (CALTRATE PLUS PO) Take 1 tablet by mouth daily.    Yes Historical Provider, MD  cholecalciferol (VITAMIN D) 1000 UNITS tablet Take 1,000 Units by mouth daily.   Yes Historical Provider, MD  fish oil-omega-3 fatty acids 1000 MG capsule Take 2 g by mouth every other day.    Yes Historical Provider, MD  Fluticasone Furoate-Vilanterol (BREO ELLIPTA) 100-25 MCG/INH AEPB Inhale 1 puff into the lungs daily. Patient taking differently: Inhale 1 puff into the lungs daily. Inhale 1 puff 07/09/14  Yes Deneise Lever, MD  levalbuterol (XOPENEX) 0.63 MG/3ML nebulizer solution Take 3 mLs (0.63 mg total) by nebulization 3 (three) times daily as needed for wheezing or shortness of breath. 07/09/14  Yes Deneise Lever, MD  losartan (COZAAR) 25 MG tablet Take 25 mg by mouth daily.    Yes Historical Provider, MD  Multiple Vitamin (MULTIVITAMIN) tablet Take 1 tablet by mouth daily.   Yes Historical Provider, MD  nitroGLYCERIN (NITROSTAT) 0.4 MG SL tablet Place 1 tablet (0.4 mg total) under the tongue every 5 (five) minutes as needed for chest pain. Patient taking differently: Place 0.4 mg under the tongue every 5 (five) minutes as needed for chest pain (MAX 3  TABLETS).  11/12/13  Yes Pixie Casino, MD  Pitavastatin Calcium (LIVALO) 4 MG TABS Take by mouth. Take half tablet three days per week   Yes Historical Provider, MD  Probiotic Product (PROBIOTIC PO) Take 1 tablet by mouth daily.   Yes Historical Provider, MD  psyllium (REGULOID) 0.52 G capsule Take 0.52 g by mouth daily.   Yes Historical Provider, MD  vitamin B-12 (CYANOCOBALAMIN) 1000 MCG tablet Take 2,000 mcg by mouth daily.   Yes Historical Provider, MD  vitamin E 400 UNIT capsule Take 400 Units by mouth daily.   Yes  Historical Provider, MD   BP 138/64 mmHg  Pulse 61  Temp(Src) 97.8 F (36.6 C) (Oral)  Resp 16  Ht 5' 4"  (1.626 m)  Wt 181 lb 6.4 oz (82.283 kg)  BMI 31.12 kg/m2  SpO2 97%  Vitals reviewed Physical Exam  Physical Examination: General appearance - alert, well appearing, and in no distress Mental status - alert, oriented to person, place, and time Eyes - pupils equal and reactive, extraocular eye movements intact Mouth - mucous membranes moist, pharynx normal without lesions Chest - clear to auscultation, no wheezes, rales or rhonchi, symmetric air entry Heart - normal rate, regular rhythm, normal S1, S2, no murmurs, rubs, clicks or gallops Abdomen - soft, nontender, nondistended, no masses or organomegaly Neurological - alert, oriented x 3, cranial nerves 2-12 tested and intact, no visual field defects, strength 5/5 in extremities x 4, sensation intact Extremities - peripheral pulses normal, no pedal edema, no clubbing or cyanosis Skin - normal coloration and turgor, no rashes Psych- normal mood and affect  ED Course  Procedures (including critical care time) Labs Review Labs Reviewed  APTT - Abnormal; Notable for the following:    aPTT 23 (*)    All other components within normal limits  COMPREHENSIVE METABOLIC PANEL - Abnormal; Notable for the following:    Creatinine, Ser 1.09 (*)    Total Protein 6.4 (*)    GFR calc non Af Amer 47 (*)    GFR calc Af Amer  54 (*)    All other components within normal limits  HEMOGLOBIN A1C - Abnormal; Notable for the following:    Hgb A1c MFr Bld 5.8 (*)    All other components within normal limits  LIPID PANEL - Abnormal; Notable for the following:    LDL Cholesterol 109 (*)    All other components within normal limits  CBG MONITORING, ED - Abnormal; Notable for the following:    Glucose-Capillary 104 (*)    All other components within normal limits  I-STAT CHEM 8, ED - Abnormal; Notable for the following:    BUN 21 (*)    All other components within normal limits  PROTIME-INR  CBC  DIFFERENTIAL  URINALYSIS, ROUTINE W REFLEX MICROSCOPIC (NOT AT [facility])  SEDIMENTATION RATE  C-REACTIVE PROTEIN  I-STAT TROPOININ, ED    Imaging Review No results found.   EKG Interpretation   Date/Time:  Wednesday October 27 2014 16:53:25 EDT Ventricular Rate:  63 PR Interval:  212 QRS Duration: 82 QT Interval:  392 QTC Calculation: 401 R Axis:   -15 Text Interpretation:  Sinus rhythm with 1st degree A-V block Low voltage  QRS Septal infarct , age undetermined Abnormal ECG No significant change  since last tracing Confirmed by [facility]  MD, Shai Mckenzie [phone]) on 10/27/2014  5:54:12 PM      MDM   Final diagnoses:  Subjective visual disturbance, left eye  Visual disturbance    Pt presenting with sensation of changes in vision of left eye, feels that she is unable to wink her right eye(when was able to do this prior), but is able to strongly close both eyelids together.  No changes in speech, or diplopia.  Initial workup negative including labs and CT head. Unable to have MRI due to pacer.  Neurology consulted.    11:44 PM Dr. Doy Mince, neurology has seen patient.  She is not able to have MRI, so neuro recommends admission to medical service, nonemergent ct angio head, blood work for temporal arteritis, nonemergent optho eval.  12:20 AM d/w [facility], Dr. [place] Pink for admission to obs med/surg bed.   Alfonzo Beers,  MD 10/30/14 [phone]

## 2014-10-27 NOTE — ED Notes (Signed)
The pt feels like her lt eye is more droopy than usual.. Rt eye feels like it has a  Film over it

## 2014-10-27 NOTE — ED Notes (Signed)
No facial droop equal grips no arm or leg drift.  Unsteady gait intermittently

## 2014-10-28 ENCOUNTER — Ambulatory Visit (HOSPITAL_COMMUNITY): Payer: Commercial Managed Care - HMO

## 2014-10-28 ENCOUNTER — Ambulatory Visit (HOSPITAL_BASED_OUTPATIENT_CLINIC_OR_DEPARTMENT_OTHER): Payer: Commercial Managed Care - HMO

## 2014-10-28 ENCOUNTER — Encounter (HOSPITAL_COMMUNITY): Payer: Self-pay

## 2014-10-28 ENCOUNTER — Observation Stay (HOSPITAL_COMMUNITY): Payer: Commercial Managed Care - HMO

## 2014-10-28 DIAGNOSIS — H409 Unspecified glaucoma: Secondary | ICD-10-CM | POA: Diagnosis not present

## 2014-10-28 DIAGNOSIS — E663 Overweight: Secondary | ICD-10-CM | POA: Diagnosis not present

## 2014-10-28 DIAGNOSIS — R51 Headache: Secondary | ICD-10-CM

## 2014-10-28 DIAGNOSIS — H5310 Unspecified subjective visual disturbances: Secondary | ICD-10-CM | POA: Diagnosis not present

## 2014-10-28 DIAGNOSIS — G459 Transient cerebral ischemic attack, unspecified: Secondary | ICD-10-CM | POA: Diagnosis not present

## 2014-10-28 DIAGNOSIS — I10 Essential (primary) hypertension: Secondary | ICD-10-CM

## 2014-10-28 DIAGNOSIS — R519 Headache, unspecified: Secondary | ICD-10-CM | POA: Diagnosis present

## 2014-10-28 LAB — LIPID PANEL
CHOL/HDL RATIO: 4.2 ratio
CHOLESTEROL: 176 mg/dL (ref 0–200)
HDL: 42 mg/dL (ref >40)
LDL Cholesterol: 109 mg/dL — ABNORMAL HIGH (ref 0–99)
TRIGLYCERIDES: 126 mg/dL (ref <150)
VLDL: 25 mg/dL (ref 0–40)

## 2014-10-28 LAB — C-REACTIVE PROTEIN

## 2014-10-28 LAB — SEDIMENTATION RATE: Sed Rate: 3 mm/hr (ref 0–22)

## 2014-10-28 MED ORDER — FLUTICASONE FUROATE-VILANTEROL 100-25 MCG/INH IN AEPB: 1.0000 | INHALATION_SPRAY | Freq: Every day | RESPIRATORY_TRACT | Status: DC

## 2014-10-28 MED ORDER — ENOXAPARIN SODIUM 40 MG/0.4ML ~~LOC~~ SOLN
40.0000 mg | SUBCUTANEOUS | Status: DC
Start: 2014-10-28 — End: 2014-10-28
  Administered 2014-10-28: 40 mg via SUBCUTANEOUS
  Filled 2014-10-28: qty 0.4

## 2014-10-28 MED ORDER — VITAMIN B-12 1000 MCG PO TABS
2000.0000 ug | ORAL_TABLET | Freq: Every day | ORAL | Status: DC
  Administered 2014-10-28: 2000 ug via ORAL
  Filled 2014-10-28: qty 2

## 2014-10-28 MED ORDER — STROKE: EARLY STAGES OF RECOVERY BOOK
Freq: Once | Status: AC
  Administered 2014-10-28: 1

## 2014-10-28 MED ORDER — NITROGLYCERIN 0.4 MG SL SUBL: 0.4000 mg | SUBLINGUAL_TABLET | SUBLINGUAL | Status: DC | PRN

## 2014-10-28 MED ORDER — IOHEXOL 350 MG/ML SOLN
50.0000 mL | Freq: Once | INTRAVENOUS | Status: AC | PRN
  Administered 2014-10-28: 50 mL via INTRAVENOUS

## 2014-10-28 MED ORDER — ACETAMINOPHEN 650 MG RE SUPP: 650.0000 mg | RECTAL | Status: DC | PRN

## 2014-10-28 MED ORDER — LOSARTAN POTASSIUM 25 MG PO TABS
25.0000 mg | ORAL_TABLET | Freq: Every day | ORAL | Status: DC
  Administered 2014-10-28: 25 mg via ORAL
  Filled 2014-10-28 (×3): qty 1

## 2014-10-28 MED ORDER — ASPIRIN 81 MG PO CHEW
324.0000 mg | CHEWABLE_TABLET | Freq: Once | ORAL | Status: AC
  Administered 2014-10-28: 324 mg via ORAL
  Filled 2014-10-28: qty 4

## 2014-10-28 MED ORDER — SENNOSIDES-DOCUSATE SODIUM 8.6-50 MG PO TABS: 1.0000 | ORAL_TABLET | Freq: Every evening | ORAL | Status: DC | PRN

## 2014-10-28 MED ORDER — ASPIRIN 81 MG PO CHEW
81.0000 mg | CHEWABLE_TABLET | Freq: Every day | ORAL | Status: DC
  Administered 2014-10-28: 81 mg via ORAL
  Filled 2014-10-28: qty 1

## 2014-10-28 MED ORDER — ACETAMINOPHEN 325 MG PO TABS: 650.0000 mg | ORAL_TABLET | ORAL | Status: DC | PRN

## 2014-10-28 MED ORDER — ATENOLOL 25 MG PO TABS
25.0000 mg | ORAL_TABLET | Freq: Every day | ORAL | Status: DC
  Administered 2014-10-28: 25 mg via ORAL
  Filled 2014-10-28: qty 1

## 2014-10-28 NOTE — Progress Notes (Signed)
PHARMACIST - PHYSICIAN COMMUNICATION     FYI, pt reports her Breo Ellipta inhaler was only for an isolated allergic event that happened during the spring and that she no longer uses it.  Breo had been ordered to continue as inpatient, but med has been discontinued 2/2 discussion w/ pt.  Wynona Neat, PharmD, BCPS 10/28/2014 3:51 AM

## 2014-10-28 NOTE — H&P (Addendum)
History and Physical  Madison [name] DPO:[id number] DOB: 1933/08/28 DOA: 10/27/2014  Referring physician: Alfonzo Beers, ER physician PCP: Jerlyn Ly, MD   Chief Complaint: Headache and vision problems of left eye  HPI: Madison [name] is a 81 y.o. female  With past medical history of CAD, breast cancer and stable glaucoma who was in her usual state of health when this past Saturday, 7/30, patient started experiencing visual changes from her left eye described as a kaleidoscope of colors. She had no eye pain. Her symptoms persisted mother following day, she complained of a left occipital headache. Patient was in [place] on vacation and because she was not comfortable with the facilities there, waited to seek medical attention only after she got home. By Monday 8/1, her left eye vision has gone from a kaleidoscope of colors to mostly a dark blur with impaired vision After she arrived back to [place], she contacted her ophthalmologist and an appointment was made for the patient on Tuesday 8/9. Patient also spoke with her PCP advised patient to come to the emergency room right away for further evaluation. The emergency room, patient had lab work done which was unremarkable. A CT scan of the head was unrevealing. Neurology was consulted who felt that this could potentially be a ischemic stroke, especially given persistent headache in the left occipital area. Because MRI not able to be done because of pacemaker, a CT angiogram was recommended by neurology as well as discussing this case with ophthalmology nonurgently. Hospitalists were called for further evaluation and admission.   Review of Systems:  Patient seen in the emergency room. Pt complains of continued left impairment of vision described as a Bigler as well as left occipital headache.  Pt denies any dysphagia, chest pain, palpitations, shortness of breath, wheeze, cough, abdominal pain, hematuria, dysuria, constipation, diarrhea, focal  extremity numbness weakness or pain.  Review of systems are otherwise negative  Past Medical History  Diagnosis Date  . ALLERGIC RHINITIS   . Asthma   . Cough   . Acute myocardial infarction, unspecified site, episode of care unspecified   . Dysfunction of eustachian tube   . Acquired absence of breast and nipple   . Esophageal reflux   . Coronary atherosclerosis   . PVC's (premature ventricular contractions)   . Hypertension   . Hyperlipidemia   . Breast cancer     left mastectomy  . Syncope 11/17/2013  . Complication of anesthesia   . PONV (postoperative nausea and vomiting)   . Anginal pain   . Heart murmur   . Peripheral vascular disease   . Shortness of breath   . Arthritis    Past Surgical History  Procedure Laterality Date  . Abdominal hysterectomy  1967  . Dilation and curettage of uterus  1962-1968    x4  . Gallbladder surgery  1985  . Placement of breast implants  1993    [facility]  . Myomectomy  1968  . Transthoracic echocardiogram  2014    EF 43-15%, grade 1 diastolic dysfunction; mildly thickened MV leaflets, trivial regurg   . Nm myocar perf wall motion  2012    bruce myoview -no inducible ischemia, EF 71%, low risk scan  . Cardiac catheterization  03/01/1986    normal coronaries (Dr. Domenic Moras)  . Cardiac catheterization  10/25/2002    normal L main; LAD w/40% narrowing in prox 3rd and 60-70% narrowing beyond 1st diagonal, LAD was tortuous; dominant RCA with 30-40% segmental narrowing and 20-30%  narrowing at junction of prox 3rd (Dr. Marella Chimes)  . Cardiac catheterization  04/11/2006    trivial luminal irregularities in coronaries and mid LAD 50% (Dr. Domenic Moras)  . Carotid doppler  2005    normal study (ordered for swelling & pain, left neck clavicle to ear)  . Cardiopulmonary met test  05/05/2012    excellent effort w/RER 1.06, peak VO2>100%, peak HR 81%, good functional capacity  . Mastectomy Left 1981  . Permanent pacemaker insertion N/A 11/18/2013     Procedure: PERMANENT PACEMAKER INSERTION;  Surgeon: Evans Lance, MD;  Location: [facility] C[facility] LAB;  Service: Cardiovascular;  Laterality: N/A;   Social History:  reports that she has never smoked. She has never used smokeless tobacco. She reports that she drinks alcohol. She reports that she does not use illicit drugs. Patient lives at home with her husband & is able to participate in activities of daily living with out use of a cane or walker, although she is limited by right hip pain which has been ongoing for some time and she plans to have surgery at some future date  Allergies  Allergen Reactions  . Prednisone Other (See Comments)    Keeps patient awake; feels like her throat is cramping up.  . Tape     PAPER TAPE -Raw, itching, bleeding skin  . Codeine Nausea And Vomiting  . Penicillins Other (See Comments)    Heart palpitations  . Percodan [Oxycodone-Aspirin] Nausea And Vomiting  . Pravastatin Other (See Comments)    Statins cause cramps    Family History  Problem Relation Age of Onset  . Heart attack Brother   . Stroke Brother   . CAD Brother     + stents  . CAD Brother     + stents  . Stroke Brother   . Lymphoma Sister   . Colon cancer Sister   . Cervical cancer Sister   . Liver cancer Brother   . Heart disease Mother   . Stroke Mother   . Colon cancer Mother 60  . Heart disease Maternal Grandmother   . Stroke Maternal Grandmother   . Heart disease Maternal Grandfather   . Cancer Maternal Grandfather   . Heart disease Paternal Grandmother   . Stroke Paternal Grandfather   . Cancer Mother   . Cancer Sister   . Cancer Brother       Prior to Admission medications   Medication Sig Start Date End Date Taking? Authorizing Provider  Alum Hydroxide-Mag Carbonate (GAVISCON PO) Take 1 tablet by mouth daily as needed (heartburn).    Yes Historical Provider, MD  aspirin 81 MG tablet Take 81 mg by mouth daily. Pt states she takes about 3-4 tablets a week with a meal.  (02/23/14)   Yes Historical Provider, MD  atenolol (TENORMIN) 25 MG tablet Take 25 mg by mouth daily.    Yes Historical Provider, MD  Calcium Carbonate-Vit D-Min (CALTRATE PLUS PO) Take 1 tablet by mouth daily.    Yes Historical Provider, MD  cholecalciferol (VITAMIN D) 1000 UNITS tablet Take 1,000 Units by mouth daily.   Yes Historical Provider, MD  fish oil-omega-3 fatty acids 1000 MG capsule Take 2 g by mouth every other day.    Yes Historical Provider, MD  Fluticasone Furoate-Vilanterol (BREO ELLIPTA) 100-25 MCG/INH AEPB Inhale 1 puff into the lungs daily. Patient taking differently: Inhale 1 puff into the lungs daily. Inhale 1 puff 07/09/14  Yes Deneise Lever, MD  levalbuterol Penne Lash) 0.63 MG/3ML  nebulizer solution Take 3 mLs (0.63 mg total) by nebulization 3 (three) times daily as needed for wheezing or shortness of breath. 07/09/14  Yes Deneise Lever, MD  losartan (COZAAR) 25 MG tablet Take 25 mg by mouth daily.    Yes Historical Provider, MD  Multiple Vitamin (MULTIVITAMIN) tablet Take 1 tablet by mouth daily.   Yes Historical Provider, MD  nitroGLYCERIN (NITROSTAT) 0.4 MG SL tablet Place 1 tablet (0.4 mg total) under the tongue every 5 (five) minutes as needed for chest pain. Patient taking differently: Place 0.4 mg under the tongue every 5 (five) minutes as needed for chest pain (MAX 3 TABLETS).  11/12/13  Yes Pixie Casino, MD  Pitavastatin Calcium (LIVALO) 4 MG TABS Take by mouth. Take half tablet three days per week   Yes Historical Provider, MD  Probiotic Product (PROBIOTIC PO) Take 1 tablet by mouth daily.   Yes Historical Provider, MD  psyllium (REGULOID) 0.52 G capsule Take 0.52 g by mouth daily.   Yes Historical Provider, MD  vitamin B-12 (CYANOCOBALAMIN) 1000 MCG tablet Take 2,000 mcg by mouth daily.   Yes Historical Provider, MD  vitamin E 400 UNIT capsule Take 400 Units by mouth daily.   Yes Historical Provider, MD    Physical Exam: BP 134/74 mmHg  Pulse 77  Temp(Src)  97.7 F (36.5 C) (Oral)  Resp 13  Ht 5' 4"  (1.626 m)  Wt 78.019 kg (172 lb)  BMI 29.51 kg/m2  SpO2 94%  General:  Alert and oriented 3, no acute distress Eyes: Sclera nonicteric, extraocular movements are intact. Patient with lack of peripheral vision on the left side even at 45. Dilated visual exam not done ENT: Normocephalic, atraumatic, mucous members are moist.  Left scalp, for head and occipital area nontender Neck: No carotid bruits Cardiovascular: Regular rate and rhythm, S1-S2 Respiratory: Clear to auscultation bilaterally Abdomen: Soft, obese, nontender, positive bowel sounds Skin: No skin breaks, tears or lesions Musculoskeletal: No clubbing or cyanosis, trace edema Psychiatric: Patient appropriate, no evidence of psychoses Neurologic: No focal deficits, cranial nerves II through XII are intact, no dysmetria, normal finger to nose, musculoskeletal strength appears symmetric           Labs on Admission:  Basic Metabolic Panel:  Recent Labs Lab 10/27/14 1700 10/27/14 1721  NA 141 141  K 4.7 4.4  CL 107 103  CO2 29  --   GLUCOSE 94 95  BUN 16 21*  CREATININE 1.09* 1.00  CALCIUM 9.6  --    Liver Function Tests:  Recent Labs Lab 10/27/14 1700  AST 33  ALT 35  ALKPHOS 70  BILITOT 0.7  PROT 6.4*  ALBUMIN 4.1   No results for input(s): LIPASE, AMYLASE in the last 168 hours. No results for input(s): AMMONIA in the last 168 hours. CBC:  Recent Labs Lab 10/27/14 1700 10/27/14 1721  WBC 6.2  --   NEUTROABS 3.4  --   HGB 13.6 14.3  HCT 40.2 42.0  MCV 88.0  --   PLT 206  --    Cardiac Enzymes: No results for input(s): CKTOTAL, CKMB, CKMBINDEX, TROPONINI in the last 168 hours.  BNP (last 3 results) No results for input(s): BNP in the last 8760 hours.  ProBNP (last 3 results) No results for input(s): PROBNP in the last 8760 hours.  CBG:  Recent Labs Lab 10/27/14 1710  GLUCAP 104*    Radiological Exams on Admission: Ct Head Wo  Contrast  10/27/2014   CLINICAL DATA:  Dizziness with posterior headache 4 days. Difficulty with vision from left eye.  EXAM: CT HEAD WITHOUT CONTRAST  TECHNIQUE: Contiguous axial images were obtained from the base of the skull through the vertex without intravenous contrast.  COMPARISON:  None.  FINDINGS: Ventricles, cisterns and other CSF spaces are within normal. There is no mass, mass effect, shift of midline structures or acute hemorrhage. No evidence of acute infarction. Remaining bones and soft tissues are within normal.  IMPRESSION: No acute intracranial findings.   Electronically Signed   By: Marin Olp M.D.   On: 10/27/2014 21:15    EKG: Independently reviewed. Sinus rhythm with low voltage and first-degree AV block, unchanged from previous  Assessment/Plan Present on Admission:  . left occipital Headache with subjective visual disturbance of the left eye: Unclear etiology. Lack of actual eye pain and location of headache may be concerning for possible ischemic event rather than intraorbital pathology. CT negative. Unable to check MRI. We'll check CT angiogram and undergo stroke workup. Will also discuss with ophthalmology in the morning.  ESR ordered and is pending to rule out temporal arteritis  . HTN (hypertension): Continue patient's antihypertensives  . Cardiac pacemaker in situ- MDT 11/18/13: Secondary to bradycardia  . CAD in native artery: Stable  . Overweight (BMI 25.0-29.9): Patient meets criteria with BMI greater than 25   . Glaucoma: Stable, not on drops at this time  Consultants: Neurology Will discuss with ophthalmology [facility] ophthalmology) in the morning  Code Status: Full code as confirmed by patient  Family Communication: Husband at the bedside   Disposition Plan: Depending on outcome of stroke workup, potential discharge as early as tomorrow evening  Time spent: 67 minutes  [facility] Pager [phone]

## 2014-10-28 NOTE — Progress Notes (Signed)
Pt transferred to unit from ED via NT x 1. Pt alert and oriented upon arrival. No complaints of pain or discomfort. No signs or symptoms of acute distress. Pt connected to telemetry and central monitoring notified. Pt oriented to unit, as well as unit procedures. Pt now resting in bed at lowest position, bed alarm on, call light in reach. Pt husband is at bedside. Will continue to monitor. Fortino Sic, RN, BSN 10/28/2014 4:00 AM

## 2014-10-28 NOTE — Progress Notes (Signed)
*  PRELIMINARY RESULTS* Echocardiogram 2D Echocardiogram has been performed.  Madison [name] 10/28/2014, 12:28 PM

## 2014-10-28 NOTE — Progress Notes (Signed)
VASCULAR LAB PRELIMINARY  PRELIMINARY  PRELIMINARY  PRELIMINARY  Carotid duplex completed.    Preliminary report:  Bilateral - No evidence of ICA stenosis. Vertebral artery flow is antegrade. No change since study of 08/24/2013 at [facility].  Madison [name], RVS 10/28/2014, 5:22 PM

## 2014-10-28 NOTE — Progress Notes (Signed)
Patient admitted after midnight. Chart reviewed. Patient examined. Still with decreased visual acuity of the left eye. Colors look different. CRP and ESR very low. Discussed with Dr. Prudencio Burly, ophthalmologist on call for Dr. Ellie Lunch. He reports it sounds like aura, but is concerned about decreased visual acuity on the left. He recommends evaluation in the office on the day of discharge for dilated eye exam, but no need for urgent inpatient evaluation at this time. Patient will have to call the office and present to the office after discharge and he will work her in.  Discharge after stroke workup complete if results okay and otherwise stable.  Doree Barthel, MD [facility]

## 2014-10-28 NOTE — Progress Notes (Signed)
Subjective: Continues to have visual changes in the left eye only.  States she sees purple color when loking at white objects with her left eye only.  No lack of vision. No diplopia.  No HA.   Objective: Current vital signs: BP 121/64 mmHg  Pulse 62  Temp(Src) 98.4 F (36.9 C) (Oral)  Resp 16  Ht 5' 4"  (1.626 m)  Wt 82.283 kg (181 lb 6.4 oz)  BMI 31.12 kg/m2  SpO2 98% Vital signs in last 24 hours: Temp:  [97.7 F (36.5 C)-98.4 F (36.9 C)] 98.4 F (36.9 C) (08/04 0500) Pulse Rate:  [59-77] 62 (08/04 0500) Resp:  [11-23] 16 (08/04 0500) BP: (102-163)/(64-87) 121/64 mmHg (08/04 0500) SpO2:  [93 %-99 %] 98 % (08/04 0500) Weight:  [78.019 kg (172 lb)-82.283 kg (181 lb 6.4 oz)] 82.283 kg (181 lb 6.4 oz) (08/04 0300)  Intake/Output from previous day:   Intake/Output this shift: Total I/O In: 240 [P.O.:240] Out: -  Nutritional status: Diet regular Room service appropriate?: Yes; Fluid consistency:: Thin  Neurologic Exam:  Mental Status: Alert, oriented, thought content appropriate.  Speech fluent without evidence of aphasia.  Able to follow 3 step commands without difficulty. Cranial Nerves: II: Visual fields grossly normal, pupils equal, round, reactive to light and accommodation.Visual acuity 20/400 in the left eye and 20/200 in the right eye. Pupils equal, round, reactive to light and accommodation  III,IV, VI: ptosis not present, extra-ocular motions intact bilaterally V,VII: smile symmetric, facial light touch sensation normal bilaterally VIII: hearing normal bilaterally IX,X: uvula rises symmetrically XI: bilateral shoulder shrug XII: midline tongue extension without atrophy or fasciculations  Motor: Right : Upper extremity   5/5    Left:     Upper extremity   5/5  Lower extremity   5/5     Lower extremity   5/5 Tone and bulk:normal tone throughout; no atrophy noted Sensory: Pinprick and light touch intact throughout, bilaterally Deep Tendon Reflexes:  Right: Upper  Extremity   Left: Upper extremity   biceps (C-5 to C-6) 2/4   biceps (C-5 to C-6) 2/4 tricep (C7) 2/4    triceps (C7) 2/4 Brachioradialis (C6) 2/4  Brachioradialis (C6) 2/4  Lower Extremity Lower Extremity  quadriceps (L-2 to L-4) 2/4   quadriceps (L-2 to L-4) 2/4 Achilles (S1) 2/4   Achilles (S1) 2/4  Plantars: Right: downgoing   Left: downgoing Cerebellar: normal finger-to-nose,  normal heel-to-shin test    Lab Results: Basic Metabolic Panel:  Recent Labs Lab 10/27/14 1700 10/27/14 1721  NA 141 141  K 4.7 4.4  CL 107 103  CO2 29  --   GLUCOSE 94 95  BUN 16 21*  CREATININE 1.09* 1.00  CALCIUM 9.6  --     Liver Function Tests:  Recent Labs Lab 10/27/14 1700  AST 33  ALT 35  ALKPHOS 70  BILITOT 0.7  PROT 6.4*  ALBUMIN 4.1   No results for input(s): LIPASE, AMYLASE in the last 168 hours. No results for input(s): AMMONIA in the last 168 hours.  CBC:  Recent Labs Lab 10/27/14 1700 10/27/14 1721  WBC 6.2  --   NEUTROABS 3.4  --   HGB 13.6 14.3  HCT 40.2 42.0  MCV 88.0  --   PLT 206  --     Cardiac Enzymes: No results for input(s): CKTOTAL, CKMB, CKMBINDEX, TROPONINI in the last 168 hours.  Lipid Panel:  Recent Labs Lab 10/28/14 0440  CHOL 176  TRIG 126  HDL 42  CHOLHDL  4.2  VLDL 25  LDLCALC 109*    CBG:  Recent Labs Lab 10/27/14 1710  GLUCAP 104*    Microbiology: No results found for this or any previous visit.  Coagulation Studies:  Recent Labs  10/27/14 1700  LABPROT 13.0  INR 0.96    Imaging: Ct Angio Head W/cm &/or Wo Cm  10/28/2014   CLINICAL DATA:  Subjective visual disturbance LEFT eye since trip to [place] 4 days ago. Assess for stroke. Multiple recent falls. History of breast cancer, hypertension.  EXAM: CT ANGIOGRAPHY HEAD  TECHNIQUE: Multidetector CT imaging of the head was performed using the standard protocol during bolus administration of intravenous contrast. Multiplanar CT image reconstructions and MIPs  were obtained to evaluate the vascular anatomy.  CONTRAST:  35m OMNIPAQUE IOHEXOL 350 MG/ML SOLN  COMPARISON:  CT head October 27, 2014  FINDINGS: CT HEAD  No abnormal intracranial enhancement.  CTA HEAD  Anterior circulation: Normal appearance of the cervical internal carotid arteries, petrous, cavernous and supra clinoid internal carotid arteries. Patent ophthalmic arteries. Widely patent anterior communicating artery. Normal appearance of the anterior and middle cerebral arteries.  Posterior circulation: LEFT vertebral artery is dominant with normal appearance of the vertebral arteries, vertebrobasilar junction and basilar artery, as well as main branch vessels. RIGHT vertebral artery predominantly terminates in the posterior inferior cerebellar artery. Robust RIGHT posterior communicating artery present. Normal appearance of the posterior cerebral arteries.  No large vessel occlusion, hemodynamically significant stenosis, dissection, luminal irregularity, contrast extravasation or aneurysm within the anterior nor posterior circulation.  IMPRESSION: No acute large vessel occlusion or high-grade stenosis. No acute vascular process.   Electronically Signed   By: CElon AlasM.D.   On: 10/28/2014 03:13   Ct Head Wo Contrast  10/27/2014   CLINICAL DATA:  Dizziness with posterior headache 4 days. Difficulty with vision from left eye.  EXAM: CT HEAD WITHOUT CONTRAST  TECHNIQUE: Contiguous axial images were obtained from the base of the skull through the vertex without intravenous contrast.  COMPARISON:  None.  FINDINGS: Ventricles, cisterns and other CSF spaces are within normal. There is no mass, mass effect, shift of midline structures or acute hemorrhage. No evidence of acute infarction. Remaining bones and soft tissues are within normal.  IMPRESSION: No acute intracranial findings.   Electronically Signed   By: DMarin OlpM.D.   On: 10/27/2014 21:15    Medications:  Scheduled: . aspirin  81 mg Oral  Daily  . atenolol  25 mg Oral Daily  . enoxaparin (LOVENOX) injection  40 mg Subcutaneous Q24H  . losartan  25 mg Oral Daily  . vitamin B-12  2,000 mcg Oral Daily    Assessment/Plan:  81YO female with visual changes in left eye only. ESR 3 and CRP 0.5. CTA head was unremarkable. Awaiting ECHO and Carotid doppler. If these are negative would have patient follow up with Opthalmology.     DEtta QuillPA-C [facility] [phone] 10/28/2014, 9:20 AM  Patient seen and evaluated. Agree with above assessment and plan. Workup unremarkable so far, unable to get MRI brain. Would continue with daily ASA over concern of possible ischemic event. If echo and doppler negative would benefit from outpatient follow up with opthalmology.    PJim Like DO [facility] [phone] If 7pm- 7am, please page neurology on call as listed in [place]

## 2014-10-28 NOTE — Discharge Summary (Signed)
Physician Discharge Summary  Madison [name] [name] DOB: 1933/08/25 DOA: 10/27/2014  PCP: Jerlyn Ly, MD  Admit date: 10/27/2014 Discharge date: 10/28/2014  Recommendations for Outpatient Follow-up:  1. Follow up with ophthalmology  Discharge Diagnoses:  Principal Problem:   Subjective visual disturbance, left eye Active Problems:   HTN (hypertension)   Cardiac pacemaker in situ- MDT 11/18/13   CAD in native artery   Headache   Overweight (BMI 25.0-29.9)   Glaucoma   Discharge Condition: stable  Diet recommendation: heart healthy  Filed Weights   10/27/14 1634 10/28/14 0300  Weight: 78.019 kg (172 lb) 82.283 kg (181 lb 6.4 oz)    History of present illness:  81 y.o. female  With past medical history of CAD, breast cancer and stable glaucoma who was in her usual state of health when this past Saturday, 7/30, patient started experiencing visual changes from her left eye described as a kaleidoscope of colors. She had no eye pain. Her symptoms persisted mother following day, she complained of a left occipital headache. Patient was in [place] on vacation and because she was not comfortable with the facilities there, waited to seek medical attention only after she got home. By Monday 8/1, her left eye vision has gone from a kaleidoscope of colors to mostly a dark blur with impaired vision After she arrived back to [place], she contacted her ophthalmologist and an appointment was made for the patient on Tuesday 8/9. Patient also spoke with her PCP advised patient to come to the emergency room right away for further evaluation. The emergency room, patient had lab work done which was unremarkable. A CT scan of the head was unrevealing. Neurology was consulted who felt that this could potentially be a ischemic stroke, especially given persistent headache in the left occipital area. Because MRI not able to be done because of pacemaker, a CT angiogram was recommended by neurology as  well as discussing this case with ophthalmology nonurgently. Hospitalists were called for further evaluation and admission.  Hospital Course:  Admitted to telemetry.  Neurology consulted.  Stroke workup unremarkable.  Echo without source of embolus. Carotid dopplers without significant stenosis.  LDL 109. hgb a1c 5.8. On livalo. Could not get MRI due to pacemaker.  Cannot rule out small stroke not seen on CT scan. Continue aspirin.  Discussed with Dr. Prudencio Burly, ophthalmology. He will follow up in the office after discharge  Procedures:  none  Consultations:  Neurology  Phone consult: ophthalmology  Discharge Exam: Filed Vitals:   10/28/14 1034  BP: 125/70  Pulse: 64  Temp: 97.9 F (36.6 C)  Resp: 16    General: a and o Cardiovascular: RRR Respiratory: CTA Neuro: decreased acuity left eye  Discharge Instructions   Discharge Instructions    Activity as tolerated - No restrictions    Complete by:  As directed      Diet - low sodium heart healthy    Complete by:  As directed           Current Discharge Medication List    CONTINUE these medications which have NOT CHANGED   Details  Alum Hydroxide-Mag Carbonate (GAVISCON PO) Take 1 tablet by mouth daily as needed (heartburn).     aspirin 81 MG tablet Take 81 mg by mouth daily. Pt states she takes about 3-4 tablets a week with a meal. (02/23/14)    atenolol (TENORMIN) 25 MG tablet Take 25 mg by mouth daily.     Calcium Carbonate-Vit D-Min (CALTRATE PLUS PO)  Take 1 tablet by mouth daily.     cholecalciferol (VITAMIN D) 1000 UNITS tablet Take 1,000 Units by mouth daily.    fish oil-omega-3 fatty acids 1000 MG capsule Take 2 g by mouth every other day.     Fluticasone Furoate-Vilanterol (BREO ELLIPTA) 100-25 MCG/INH AEPB Inhale 1 puff into the lungs daily. Qty: 1 each, Refills: 6    levalbuterol (XOPENEX) 0.63 MG/3ML nebulizer solution Take 3 mLs (0.63 mg total) by nebulization 3 (three) times daily as needed for  wheezing or shortness of breath. Qty: 75 mL, Refills: 11    losartan (COZAAR) 25 MG tablet Take 25 mg by mouth daily.     Multiple Vitamin (MULTIVITAMIN) tablet Take 1 tablet by mouth daily.    nitroGLYCERIN (NITROSTAT) 0.4 MG SL tablet Place 1 tablet (0.4 mg total) under the tongue every 5 (five) minutes as needed for chest pain. Qty: 25 tablet, Refills: 3    Pitavastatin Calcium (LIVALO) 4 MG TABS Take by mouth. Take half tablet three days per week    Probiotic Product (PROBIOTIC PO) Take 1 tablet by mouth daily.    psyllium (REGULOID) 0.52 G capsule Take 0.52 g by mouth daily.    vitamin B-12 (CYANOCOBALAMIN) 1000 MCG tablet Take 2,000 mcg by mouth daily.    vitamin E 400 UNIT capsule Take 400 Units by mouth daily.       Allergies  Allergen Reactions  . Prednisone Other (See Comments)    Keeps patient awake; feels like her throat is cramping up.  . Tape     PAPER TAPE -Raw, itching, bleeding skin  . Codeine Nausea And Vomiting  . Penicillins Other (See Comments)    Heart palpitations  . Percodan [Oxycodone-Aspirin] Nausea And Vomiting  . Pravastatin Other (See Comments)    Statins cause cramps   Follow-up Information    Follow up with Katy Apo, MD.   Specialty:  Ophthalmology   Why:  tomorrow. call office and tell them Dr. Conley Canal spoke to Dr. Prudencio Burly who will fit you in   Contact information:   [place] [address] [phone]        The results of significant diagnostics from this hospitalization (including imaging, microbiology, ancillary and laboratory) are listed below for reference.    Significant Diagnostic Studies: Ct Angio Head W/cm &/or Wo Cm  10/28/2014   CLINICAL DATA:  Subjective visual disturbance LEFT eye since trip to [place] 4 days ago. Assess for stroke. Multiple recent falls. History of breast cancer, hypertension.  EXAM: CT ANGIOGRAPHY HEAD  TECHNIQUE: Multidetector CT imaging of the head was performed using the standard  protocol during bolus administration of intravenous contrast. Multiplanar CT image reconstructions and MIPs were obtained to evaluate the vascular anatomy.  CONTRAST:  3mL OMNIPAQUE IOHEXOL 350 MG/ML SOLN  COMPARISON:  CT head October 27, 2014  FINDINGS: CT HEAD  No abnormal intracranial enhancement.  CTA HEAD  Anterior circulation: Normal appearance of the cervical internal carotid arteries, petrous, cavernous and supra clinoid internal carotid arteries. Patent ophthalmic arteries. Widely patent anterior communicating artery. Normal appearance of the anterior and middle cerebral arteries.  Posterior circulation: LEFT vertebral artery is dominant with normal appearance of the vertebral arteries, vertebrobasilar junction and basilar artery, as well as main branch vessels. RIGHT vertebral artery predominantly terminates in the posterior inferior cerebellar artery. Robust RIGHT posterior communicating artery present. Normal appearance of the posterior cerebral arteries.  No large vessel occlusion, hemodynamically significant stenosis, dissection, luminal irregularity, contrast extravasation  or aneurysm within the anterior nor posterior circulation.  IMPRESSION: No acute large vessel occlusion or high-grade stenosis. No acute vascular process.   Electronically Signed   By: Elon Alas M.D.   On: 10/28/2014 03:13   Ct Head Wo Contrast  10/27/2014   CLINICAL DATA:  Dizziness with posterior headache 4 days. Difficulty with vision from left eye.  EXAM: CT HEAD WITHOUT CONTRAST  TECHNIQUE: Contiguous axial images were obtained from the base of the skull through the vertex without intravenous contrast.  COMPARISON:  None.  FINDINGS: Ventricles, cisterns and other CSF spaces are within normal. There is no mass, mass effect, shift of midline structures or acute hemorrhage. No evidence of acute infarction. Remaining bones and soft tissues are within normal.  IMPRESSION: No acute intracranial findings.   Electronically  Signed   By: Marin Olp M.D.   On: 10/27/2014 21:15   Echo Left ventricle: The cavity size was normal. There was mild concentric hypertrophy. Systolic function was normal. The estimated ejection fraction was in the range of 55% to 60%. Wall motion was normal; there were no regional wall motion abnormalities. Doppler parameters are consistent with abnormal left ventricular relaxation (grade 1 diastolic dysfunction).  Carotid doppler Bilateral - No evidence of ICA stenosis. Vertebral artery flow is antegrade. No change since study of 2015.  Microbiology: No results found for this or any previous visit (from the past 240 hour(s)).   Labs: Basic Metabolic Panel:  Recent Labs Lab 10/27/14 1700 10/27/14 1721  NA 141 141  K 4.7 4.4  CL 107 103  CO2 29  --   GLUCOSE 94 95  BUN 16 21*  CREATININE 1.09* 1.00  CALCIUM 9.6  --    Liver Function Tests:  Recent Labs Lab 10/27/14 1700  AST 33  ALT 35  ALKPHOS 70  BILITOT 0.7  PROT 6.4*  ALBUMIN 4.1   No results for input(s): LIPASE, AMYLASE in the last 168 hours. No results for input(s): AMMONIA in the last 168 hours. CBC:  Recent Labs Lab 10/27/14 1700 10/27/14 1721  WBC 6.2  --   NEUTROABS 3.4  --   HGB 13.6 14.3  HCT 40.2 42.0  MCV 88.0  --   PLT 206  --    Cardiac Enzymes: No results for input(s): CKTOTAL, CKMB, CKMBINDEX, TROPONINI in the last 168 hours. BNP: BNP (last 3 results) No results for input(s): BNP in the last 8760 hours.  ProBNP (last 3 results) No results for input(s): PROBNP in the last 8760 hours.  CBG:  Recent Labs Lab 10/27/14 1710  GLUCAP 104*       Signed:  Benbrook L  [facility] 10/28/2014, 5:39 PM

## 2014-10-28 NOTE — Progress Notes (Signed)
D/C orders received, pt for D/C home today.  IV and telemetry D/C.  Rx and D/C instructions given with verbalized understanding.  Family at bedside to assist with D/C.  Staff brought pt downstairs via wheelchair.  

## 2014-10-29 LAB — HEMOGLOBIN A1C
Hgb A1c MFr Bld: 5.8 % — ABNORMAL HIGH (ref 4.8–5.6)
Mean Plasma Glucose: 120 mg/dL

## 2014-12-17 ENCOUNTER — Ambulatory Visit (INDEPENDENT_AMBULATORY_CARE_PROVIDER_SITE_OTHER): Payer: Commercial Managed Care - HMO | Admitting: Internal Medicine

## 2014-12-17 ENCOUNTER — Ambulatory Visit (INDEPENDENT_AMBULATORY_CARE_PROVIDER_SITE_OTHER): Payer: Commercial Managed Care - HMO | Admitting: *Deleted

## 2014-12-17 ENCOUNTER — Encounter: Payer: Self-pay | Admitting: Internal Medicine

## 2014-12-17 VITALS — BP 120/84 | HR 51 | Ht 64.0 in | Wt 179.8 lb

## 2014-12-17 DIAGNOSIS — I442 Atrioventricular block, complete: Secondary | ICD-10-CM | POA: Diagnosis not present

## 2014-12-17 DIAGNOSIS — I10 Essential (primary) hypertension: Secondary | ICD-10-CM | POA: Diagnosis not present

## 2014-12-17 DIAGNOSIS — E785 Hyperlipidemia, unspecified: Secondary | ICD-10-CM

## 2014-12-17 LAB — CUP PACEART INCLINIC DEVICE CHECK
Battery Remaining Longevity: 156 mo
Battery Voltage: 2.79 V
Brady Statistic AP VP Percent: 0 %
Brady Statistic AS VP Percent: 0 %
Brady Statistic AS VS Percent: 15 %
Date Time Interrogation Session: 20160923125516
Lead Channel Pacing Threshold Amplitude: 0.5 V
Lead Channel Pacing Threshold Amplitude: 0.75 V
Lead Channel Pacing Threshold Pulse Width: 0.4 ms
Lead Channel Sensing Intrinsic Amplitude: 11.2 mV
Lead Channel Setting Pacing Amplitude: 2 V
Lead Channel Setting Pacing Amplitude: 2.5 V
Lead Channel Setting Pacing Pulse Width: 0.4 ms
Lead Channel Setting Sensing Sensitivity: 5.6 mV
MDC IDC MSMT BATTERY IMPEDANCE: 112 Ohm
MDC IDC MSMT LEADCHNL RA IMPEDANCE VALUE: 439 Ohm
MDC IDC MSMT LEADCHNL RA PACING THRESHOLD PULSEWIDTH: 0.4 ms
MDC IDC MSMT LEADCHNL RA SENSING INTR AMPL: 2 mV
MDC IDC MSMT LEADCHNL RV IMPEDANCE VALUE: 661 Ohm
MDC IDC STAT BRADY AP VS PERCENT: 85 %

## 2014-12-17 NOTE — Progress Notes (Signed)
Cardiology Office Note   Date:  12/17/2014   ID:  Tineka, Uriegas 17-Feb-1934, MRN [id number]  PCP:  Jerlyn Ly, MD  Cardiologist:   Dorris Carnes, MD   No chief complaint on file.     History of Present Illness: Madison [name] is a 81 y.o. female with a history of  CAD (see below) She was previously followed by [facility] After he retired whe was seen by [facility].  Echo showed LVEF was Normal with mild diastolic dysfunction. Cardiopulm stress testing showed an ischemic response but good exercise capacity. ? Small vessel dz or deconditioning. Had SOB in AUg 2015 Went to ER bradycardic Medicines werer hed but she then went on to have PPM placed She continued to complain of some dyspnea after this She went on to have lexiscan myoview done This was normal No ischemia or scar  Seen in ER and admitted in Aug  Dizzy  Then couldn't see. Neuro saw  Echo, CT negative  Carotid dopplers negative  MRI could not be done due to PPM She has been seen by Dr Georjean Mode since     L eye vision still there but down    R eye vision is OK  Admits to some mild wheezing  Has asthma No CP Can feel sparking in chest   Started in August  Having spells frequently since august   Also says that since event she has not felt normal on L side of head    Current Outpatient Prescriptions  Medication Sig Dispense Refill  . Alum Hydroxide-Mag Carbonate (GAVISCON PO) Take 1 tablet by mouth daily as needed (heartburn).     Marland Kitchen aspirin 81 MG tablet Take 81 mg by mouth daily. Pt states she takes about 3-4 tablets a week with a meal. (02/23/14)    . atenolol (TENORMIN) 25 MG tablet Take 25 mg by mouth daily.     . Calcium Carbonate-Vit D-Min (CALTRATE PLUS PO) Take 1 tablet by mouth daily.     . cholecalciferol (VITAMIN D) 1000 UNITS tablet Take 1,000 Units by mouth daily.    . fish oil-omega-3 fatty acids 1000 MG capsule Take 2 g by mouth every other day.     . Fluticasone Furoate-Vilanterol (BREO ELLIPTA)  100-25 MCG/INH AEPB Inhale 1 puff into the lungs daily as needed (SHORTNESS OF BREATH).    Marland Kitchen levalbuterol (XOPENEX) 0.63 MG/3ML nebulizer solution Take 3 mLs (0.63 mg total) by nebulization 3 (three) times daily as needed for wheezing or shortness of breath. 75 mL 11  . losartan (COZAAR) 25 MG tablet Take 25 mg by mouth daily.     . Multiple Vitamin (MULTIVITAMIN) tablet Take 1 tablet by mouth daily.    . nitroGLYCERIN (NITROSTAT) 0.4 MG SL tablet Place 1 tablet (0.4 mg total) under the tongue every 5 (five) minutes as needed for chest pain. (Patient taking differently: Place 0.4 mg under the tongue every 5 (five) minutes as needed for chest pain (MAX 3 TABLETS). ) 25 tablet 3  . Pitavastatin Calcium (LIVALO) 4 MG TABS Take 2 mg by mouth every other day.    . Probiotic Product (PROBIOTIC PO) Take 1 tablet by mouth daily.    . Psyllium (METAMUCIL PO) Take one (1) tablespoon by mouth daily.    . vitamin B-12 (CYANOCOBALAMIN) 1000 MCG tablet Take 2,000 mcg by mouth daily.    . vitamin E 400 UNIT capsule Take 400 Units by mouth daily.     No current facility-administered  medications for this visit.    Allergies:   Prednisone; Tape; Codeine; Penicillins; Percodan; and Pravastatin   Past Medical History  Diagnosis Date  . ALLERGIC RHINITIS   . Asthma   . Cough   . Acute myocardial infarction, unspecified site, episode of care unspecified   . Dysfunction of eustachian tube   . Acquired absence of breast and nipple   . Esophageal reflux   . Coronary atherosclerosis   . PVC's (premature ventricular contractions)   . Hypertension   . Hyperlipidemia   . Breast cancer     left mastectomy  . Syncope 11/17/2013  . Complication of anesthesia   . PONV (postoperative nausea and vomiting)   . Anginal pain   . Heart murmur   . Peripheral vascular disease   . Shortness of breath   . Arthritis     Past Surgical History  Procedure Laterality Date  . Abdominal hysterectomy  1967  . Dilation and  curettage of uterus  1962-1968    x4  . Gallbladder surgery  1985  . Placement of breast implants  1993    [facility]  . Myomectomy  1968  . Transthoracic echocardiogram  2014    EF 95-09%, grade 1 diastolic dysfunction; mildly thickened MV leaflets, trivial regurg   . Nm myocar perf wall motion  2012    bruce myoview -no inducible ischemia, EF 71%, low risk scan  . Cardiac catheterization  03/01/1986    normal coronaries (Dr. Domenic Moras)  . Cardiac catheterization  10/25/2002    normal L main; LAD w/40% narrowing in prox 3rd and 60-70% narrowing beyond 1st diagonal, LAD was tortuous; dominant RCA with 30-40% segmental narrowing and 20-30% narrowing at junction of prox 3rd (Dr. Marella Chimes)  . Cardiac catheterization  04/11/2006    trivial luminal irregularities in coronaries and mid LAD 50% (Dr. Domenic Moras)  . Carotid doppler  2005    normal study (ordered for swelling & pain, left neck clavicle to ear)  . Cardiopulmonary met test  05/05/2012    excellent effort w/RER 1.06, peak VO2>100%, peak HR 81%, good functional capacity  . Mastectomy Left 1981  . Permanent pacemaker insertion N/A 11/18/2013    Procedure: PERMANENT PACEMAKER INSERTION;  Surgeon: Evans Lance, MD;  [facility];  Service: Cardiovascular;  Laterality: N/A;     Social History:  The patient  reports that she has never smoked. She has never used smokeless tobacco. She reports that she drinks alcohol. She reports that she does not use illicit drugs.   Family History:  The patient's family history includes CAD in her brother and brother; Cancer in her brother, maternal grandfather, mother, and sister; Cervical cancer in her sister; Colon cancer in her sister; Colon cancer (age of onset: 105) in her mother; Heart attack in her brother; Heart disease in her maternal grandfather, maternal grandmother, mother, and paternal grandmother; Liver cancer in her brother; Lymphoma in her sister; Stroke in her brother, brother, maternal  grandmother, mother, and paternal grandfather.    ROS:  Please see the history of present illness. All other systems are reviewed and  Negative to the above problem except as noted.    PHYSICAL EXAM: VS:  BP 120/84 mmHg  Pulse 51  Ht _0  (1.626 m)  Wt 179 lb 12.8 oz (81.557 kg)  BMI 30.85 kg/m2  SpO2 94%  GEN: Well nourished, well developed, in no acute distress HEENT: normal Neck: no JVD, carotid bruits, or masses Cardiac:  RRR; no murmurs, rubs, or gallops,no edema  Respiratory:  clear to auscultation bilaterally, normal work of breathing GI: soft, nontender, nondistended, + BS  No hepatomegaly  MS: no deformity Moving all extremities   Skin: warm and dry, no rash Neuro:  Strength and sensation are intact Psych: euthymic mood, full affect   EKG:  EKG is not ordered today.   Lipid Panel    Component Value Date/Time   CHOL 176 10/28/2014 0440   TRIG 126 10/28/2014 0440   HDL 42 10/28/2014 0440   CHOLHDL 4.2 10/28/2014 0440   VLDL 25 10/28/2014 0440   LDLCALC 109* 10/28/2014 0440      Wt Readings from Last 3 Encounters:  12/17/14 179 lb 12.8 oz (81.557 kg)  10/28/14 181 lb 6.4 oz (82.283 kg)  06/23/14 176 lb 3.2 oz (79.924 kg)      ASSESSMENT AND PLAN:   1  Neuro  Concerning   Need to get records from Dr Ellie Lunch  Pt says she was told retinal artery was occluded  Concerning for embolic   Interrogations of pacer today shows 40 sec of atrial flutter in April but nothing since.  I will review with EP and ophthy.  2.  CAD  Asymtpomatic.  Keep on medical Rx  3.  HTN  BP is good her  Need to get records from ophthy   ? If high there   Pt teaful  Upset about current situation and very tough year with deaths in family  Wants to feel better.    Signed, Dorris Carnes, MD  12/17/2014 8:27 AM    [address], [place], [place]  [address] Phone: [phone]; Fax: [phone]

## 2014-12-17 NOTE — Patient Instructions (Signed)
Medication Instructions:   Your physician recommends that you continue on your current medications as directed. Please refer to the Current Medication list given to you today.    Labwork:  NONE ORDER TODAY    Testing/Procedures:NONE ORDER TODAY    Follow-Up:  Your physician wants you to follow-up in:  IN  9  MONTHS WITH DR Harrington Challenger  You will receive a reminder letter in the mail two months in advance. If you don't receive a letter, please call our office to schedule the follow-up appointment.     Any Other Special Instructions Will Be Listed Below (If Applicable).

## 2014-12-18 NOTE — Progress Notes (Signed)
Pacemaker check in clinic w/Dr.Ross. Normal device function. Thresholds, sensing, impedances consistent with previous measurements. Device programmed to maximize longevity. (7) mode switches (<0.1%)---1 AHR episode on 06/28/14 x 46 sec @ 225/126bpm---AFL + ASA. (2) high ventricular rates noted--max dur. 6 sec, Max Avg V 150---1:1 AT. Device programmed at appropriate safety margins. Histogram distribution appropriate for patient activity level. Device programmed to optimize intrinsic conduction. Estimated longevity 13 years. Patient will follow up with [facility] as scheduled.

## 2014-12-21 ENCOUNTER — Telehealth: Payer: Self-pay | Admitting: *Deleted

## 2014-12-21 NOTE — Telephone Encounter (Signed)
Follow up   ° ° °Returning call back to nurse from today   °

## 2014-12-21 NOTE — Telephone Encounter (Signed)
Spoke with pt and she states that she called Dr. Benna Dunks office this morning and requested that they send over the OV and the nurse she spoke with said that she would take care of it.   Pt states that coming to either office is not convenient since she is not able to drive at this time. Pt states that she normally never has to fill out a form to get them to send notes. Pt states that she will call there office again now and have them fax over Ov.  Provided fax number to pt. Pt also states that Dr. Ellie Lunch referred her to another opthamalogist with [facility] by the name of Dr. Cordelia Pen and she will be seeing him on Thursday. Pt states that she plans to request Dr. Cinda Quest office fax over their note as well. Will route to [place] to make her aware.

## 2014-12-21 NOTE — Telephone Encounter (Signed)
Received message from [place] that Dr. Harrington Challenger has requested OV notes from [facility].  Called them, [phone]. Patient needs to sign a release for Dr. Harrington Challenger to receive the records.  Called the patient to what is most convenient for her to come here and we can request or go to [facility] and sign there. Left message to call back.

## 2014-12-23 DIAGNOSIS — H40003 Preglaucoma, unspecified, bilateral: Secondary | ICD-10-CM | POA: Insufficient documentation

## 2014-12-23 DIAGNOSIS — H34812 Central retinal vein occlusion, left eye, with macular edema: Secondary | ICD-10-CM | POA: Insufficient documentation

## 2014-12-23 NOTE — Telephone Encounter (Signed)
Received OV notes from Dr. Benna Dunks office.  Placed in Dr. Alan Ripper folder in her mailbox.

## 2015-01-05 ENCOUNTER — Encounter: Payer: Self-pay | Admitting: Internal Medicine

## 2015-01-05 ENCOUNTER — Ambulatory Visit (INDEPENDENT_AMBULATORY_CARE_PROVIDER_SITE_OTHER): Payer: Commercial Managed Care - HMO | Admitting: Internal Medicine

## 2015-01-05 VITALS — BP 130/86 | HR 84 | Ht 64.0 in | Wt 183.0 lb

## 2015-01-05 DIAGNOSIS — Z950 Presence of cardiac pacemaker: Secondary | ICD-10-CM | POA: Diagnosis not present

## 2015-01-05 DIAGNOSIS — H5310 Unspecified subjective visual disturbances: Secondary | ICD-10-CM | POA: Diagnosis not present

## 2015-01-05 DIAGNOSIS — I442 Atrioventricular block, complete: Secondary | ICD-10-CM

## 2015-01-05 DIAGNOSIS — I495 Sick sinus syndrome: Secondary | ICD-10-CM

## 2015-01-05 DIAGNOSIS — I10 Essential (primary) hypertension: Secondary | ICD-10-CM | POA: Diagnosis not present

## 2015-01-05 LAB — CUP PACEART INCLINIC DEVICE CHECK
Battery Impedance: 100 Ohm
Battery Voltage: 2.79 V
Brady Statistic AP VP Percent: 0 %
Brady Statistic AP VS Percent: 92 %
Brady Statistic AS VP Percent: 0 %
Brady Statistic AS VS Percent: 8 %
Date Time Interrogation Session: 20161012121303
Implantable Lead Implant Date: 20150826
Implantable Lead Implant Date: 20150826
Implantable Lead Location: 753859
Implantable Lead Model: 5076
Implantable Lead Model: 5076
Lead Channel Impedance Value: 422 Ohm
Lead Channel Pacing Threshold Amplitude: 0.5 V
Lead Channel Pacing Threshold Amplitude: 0.75 V
Lead Channel Pacing Threshold Pulse Width: 0.4 ms
Lead Channel Pacing Threshold Pulse Width: 0.4 ms
Lead Channel Sensing Intrinsic Amplitude: 1.4 mV
Lead Channel Setting Pacing Amplitude: 2.5 V
MDC IDC LEAD LOCATION: 753860
MDC IDC MSMT BATTERY REMAINING LONGEVITY: 142 mo
MDC IDC MSMT LEADCHNL RV IMPEDANCE VALUE: 667 Ohm
MDC IDC MSMT LEADCHNL RV SENSING INTR AMPL: 11.2 mV
MDC IDC SET LEADCHNL RA PACING AMPLITUDE: 2 V
MDC IDC SET LEADCHNL RV PACING PULSEWIDTH: 0.4 ms
MDC IDC SET LEADCHNL RV SENSING SENSITIVITY: 5.6 mV

## 2015-01-05 NOTE — Patient Instructions (Signed)
Medication Instructions:  Your physician recommends that you continue on your current medications as directed. Please refer to the Current Medication list given to you today.   Labwork: None ordered   Testing/Procedures: None ordered   Follow-Up: Your physician wants you to follow-up in: 12 months with Dr Taylor You will receive a reminder letter in the mail two months in advance. If you don't receive a letter, please call our office to schedule the follow-up appointment.   Remote monitoring is used to monitor your Pacemaker from home. This monitoring reduces the number of office visits required to check your device to one time per year. It allows us to keep an eye on the functioning of your device to ensure it is working properly. You are scheduled for a device check from home on 04/06/15. You may send your transmission at any time that day. If you have a wireless device, the transmission will be sent automatically. After your physician reviews your transmission, you will receive a postcard with your next transmission date.    Any Other Special Instructions Will Be Listed Below (If Applicable).   

## 2015-01-05 NOTE — Assessment & Plan Note (Signed)
Unclear whether this was embolic. She has not had atrial fib.

## 2015-01-05 NOTE — Assessment & Plan Note (Signed)
Her blood pressure is well controlled. Will follow.  

## 2015-01-05 NOTE — Assessment & Plan Note (Signed)
She is s/p PPM insertion. Asymptomatic.

## 2015-01-05 NOTE — Assessment & Plan Note (Signed)
Her medtronic DDD PM is working normally. Will recheck in several months. 

## 2015-01-05 NOTE — Progress Notes (Signed)
HPI Madison [name] returns today for followup. She is a pleasant 81 yo woman with symptomatic sinus node dysfunction and heart block who underwent PPM insertion approx. 12 months ago. In the interim, she has done well with no chest pain or sob. She has not had any palpitations or syncope. She had a heat a stroke while down in [place].  Allergies  Allergen Reactions  . Prednisone Other (See Comments)    Keeps patient awake; feels like her throat is cramping up.  . Tape     PAPER TAPE -Raw, itching, bleeding skin  . Codeine Nausea And Vomiting  . Penicillins Other (See Comments)    Heart palpitations  . Percodan [Oxycodone-Aspirin] Nausea And Vomiting  . Pravastatin Other (See Comments)    Statins cause cramps     Current Outpatient Prescriptions  Medication Sig Dispense Refill  . Alum Hydroxide-Mag Carbonate (GAVISCON PO) Take 1 tablet by mouth daily as needed (heartburn).     Marland Kitchen aspirin 81 MG tablet Take 81 mg by mouth daily. Pt states she takes about 3-4 tablets a week with a meal. (02/23/14)    . atenolol (TENORMIN) 25 MG tablet Take 25 mg by mouth daily.     . Calcium Carbonate-Vit D-Min (CALTRATE PLUS PO) Take 1 tablet by mouth daily.     . cholecalciferol (VITAMIN D) 1000 UNITS tablet Take 1,000 Units by mouth daily.    . fish oil-omega-3 fatty acids 1000 MG capsule Take 2 g by mouth every other day.     . Fluticasone Furoate-Vilanterol (BREO ELLIPTA) 100-25 MCG/INH AEPB Inhale 1 puff into the lungs daily as needed (SHORTNESS OF BREATH).    Marland Kitchen levalbuterol (XOPENEX) 0.63 MG/3ML nebulizer solution Take 3 mLs (0.63 mg total) by nebulization 3 (three) times daily as needed for wheezing or shortness of breath. 75 mL 11  . losartan (COZAAR) 25 MG tablet Take 25 mg by mouth daily.     . Multiple Vitamin (MULTIVITAMIN) tablet Take 1 tablet by mouth daily.    . nitroGLYCERIN (NITROSTAT) 0.4 MG SL tablet Place 1 tablet (0.4 mg total) under the tongue every 5 (five) minutes as needed for  chest pain. (Patient taking differently: Place 0.4 mg under the tongue every 5 (five) minutes as needed for chest pain (MAX 3 TABLETS). ) 25 tablet 3  . Pitavastatin Calcium (LIVALO) 4 MG TABS Take 2 mg by mouth every other day.    . Probiotic Product (PROBIOTIC PO) Take 1 tablet by mouth daily.    . Psyllium (METAMUCIL PO) Take one (1) tablespoon by mouth daily.    . vitamin B-12 (CYANOCOBALAMIN) 1000 MCG tablet Take 2,000 mcg by mouth daily.    . vitamin E 400 UNIT capsule Take 400 Units by mouth daily.     No current facility-administered medications for this visit.     Past Medical History  Diagnosis Date  . ALLERGIC RHINITIS   . Asthma   . Cough   . Acute myocardial infarction, unspecified site, episode of care unspecified   . Dysfunction of eustachian tube   . Acquired absence of breast and nipple   . Esophageal reflux   . Coronary atherosclerosis   . PVC's (premature ventricular contractions)   . Hypertension   . Hyperlipidemia   . Breast cancer ([place])     left mastectomy  . Syncope 11/17/2013  . Complication of anesthesia   . PONV (postoperative nausea and vomiting)   . Anginal pain ([place])   .  Heart murmur   . Peripheral vascular disease ([place])   . Shortness of breath   . Arthritis     ROS:   All systems reviewed and negative except as noted in the HPI.   Past Surgical History  Procedure Laterality Date  . Abdominal hysterectomy  1967  . Dilation and curettage of uterus  1962-1968    x4  . Gallbladder surgery  1985  . Placement of breast implants  1993    [facility]  . Myomectomy  1968  . Transthoracic echocardiogram  2014    EF 28-41%, grade 1 diastolic dysfunction; mildly thickened MV leaflets, trivial regurg   . Nm myocar perf wall motion  2012    bruce myoview -no inducible ischemia, EF 71%, low risk scan  . Cardiac catheterization  03/01/1986    normal coronaries (Dr. Domenic Moras)  . Cardiac catheterization  10/25/2002    normal L main; LAD w/40% narrowing in  prox 3rd and 60-70% narrowing beyond 1st diagonal, LAD was tortuous; dominant RCA with 30-40% segmental narrowing and 20-30% narrowing at junction of prox 3rd (Dr. Marella Chimes)  . Cardiac catheterization  04/11/2006    trivial luminal irregularities in coronaries and mid LAD 50% (Dr. Domenic Moras)  . Carotid doppler  2005    normal study (ordered for swelling & pain, left neck clavicle to ear)  . Cardiopulmonary met test  05/05/2012    excellent effort w/RER 1.06, peak VO2>100%, peak HR 81%, good functional capacity  . Mastectomy Left 1981  . Permanent pacemaker insertion N/A 11/18/2013    Procedure: PERMANENT PACEMAKER INSERTION;  Surgeon: Evans Lance, MD;  Location: [facility] CATH LAB;  Service: Cardiovascular;  Laterality: N/A;     Family History  Problem Relation Age of Onset  . Heart attack Brother   . Stroke Brother   . CAD Brother     + stents  . CAD Brother     + stents  . Stroke Brother   . Lymphoma Sister   . Colon cancer Sister   . Cervical cancer Sister   . Liver cancer Brother   . Heart disease Mother   . Stroke Mother   . Colon cancer Mother 47  . Heart disease Maternal Grandmother   . Stroke Maternal Grandmother   . Heart disease Maternal Grandfather   . Cancer Maternal Grandfather   . Heart disease Paternal Grandmother   . Stroke Paternal Grandfather   . Cancer Mother   . Cancer Sister   . Cancer Brother      Social History   Social History  . Marital Status: Married    Spouse Name: N/A  . Number of Children: 1  . Years of Education: N/A   Occupational History  . RETIRED-swim coach and physical educator    Social History Main Topics  . Smoking status: Never Smoker   . Smokeless tobacco: Never Used  . Alcohol Use: Yes     Comment: RARE  . Drug Use: No  . Sexual Activity: Not on file   Other Topics Concern  . Not on file   Social History Narrative     BP 130/86 mmHg  Pulse 84  Ht 5' 4"  (1.626 m)  Wt 183 lb (83.008 kg)  BMI 31.40  kg/m2  Physical Exam:  Well appearing 82 yo woman, NAD HEENT: Unremarkable Neck:  7 cm JVD, no thyromegally Lymphatics:  No adenopathy Back:  No CVA tenderness Lungs:  Clear with no wheezes, well healed right sided  PM incision. HEART:  Regular rate rhythm, no murmurs, no rubs, no clicks Abd:  soft, positive bowel sounds, no organomegally, no rebound, no guarding Ext:  2 plus pulses, no edema, no cyanosis, no clubbing Skin:  No rashes no nodules Neuro:  CN II through XII intact, motor grossly intact   DEVICE  Normal device function.  See PaceArt for details.   Assess/Plan:

## 2015-01-11 ENCOUNTER — Encounter: Payer: Self-pay | Admitting: Internal Medicine

## 2015-01-26 ENCOUNTER — Telehealth: Payer: Self-pay | Admitting: *Deleted

## 2015-01-26 NOTE — Telephone Encounter (Signed)
Received cardiac clearance form from [facility]. Placed in folder for Dr. Harrington Challenger to review/sign.

## 2015-01-31 ENCOUNTER — Encounter: Payer: Self-pay | Admitting: Internal Medicine

## 2015-02-01 ENCOUNTER — Ambulatory Visit
Admission: RE | Admit: 2015-02-01 | Discharge: 2015-02-01 | Disposition: A | Discharge status: Home / Self Care | Payer: Commercial Managed Care - HMO | Source: Ambulatory Visit | Attending: Internal Medicine | Admitting: Internal Medicine

## 2015-02-01 ENCOUNTER — Other Ambulatory Visit: Payer: Self-pay | Admitting: Internal Medicine

## 2015-02-01 DIAGNOSIS — M542 Cervicalgia: Secondary | ICD-10-CM

## 2015-02-23 ENCOUNTER — Ambulatory Visit (INDEPENDENT_AMBULATORY_CARE_PROVIDER_SITE_OTHER): Payer: Commercial Managed Care - HMO | Admitting: Internal Medicine

## 2015-02-23 ENCOUNTER — Encounter: Payer: Self-pay | Admitting: Internal Medicine

## 2015-02-23 VITALS — BP 98/62 | HR 69 | Ht 64.0 in | Wt 178.0 lb

## 2015-02-23 DIAGNOSIS — J4521 Mild intermittent asthma with (acute) exacerbation: Secondary | ICD-10-CM

## 2015-02-23 MED ORDER — LEVALBUTEROL HCL 0.63 MG/3ML IN NEBU
0.6300 mg | INHALATION_SOLUTION | Freq: Once | RESPIRATORY_TRACT | Status: AC
  Administered 2015-02-23: 0.63 mg via RESPIRATORY_TRACT

## 2015-02-23 MED ORDER — METHYLPREDNISOLONE ACETATE 80 MG/ML IJ SUSP
80.0000 mg | Freq: Once | INTRAMUSCULAR | Status: AC
  Administered 2015-02-23: 80 mg via INTRAMUSCULAR

## 2015-02-23 MED ORDER — FLUTICASONE FUROATE-VILANTEROL 100-25 MCG/INH IN AEPB: INHALATION_SPRAY | RESPIRATORY_TRACT | Status: DC

## 2015-02-23 NOTE — Assessment & Plan Note (Signed)
Main acute issue is a viral URI. She does have a Z-Pak holding-we discussed indications. Plan-nebulizer treatments Xopenex, Depo-Medrol, fluids and comfort measures

## 2015-02-23 NOTE — Assessment & Plan Note (Signed)
Now with pacemaker. Managed by cardiology. She does not report any acute concerns at this visit.

## 2015-02-23 NOTE — Progress Notes (Signed)
06/06/11- 49 yoF followed for allergic asthma, allergic rhinitis complicated by GERD, CAD/MI, hx L mastectomy LOV- 06/17/08 Z pak and Mucinex helped her through a bronchitis in mid-February. Since then mild residual congestion is slowly clearing and she denies need for further intervention. Daily saline nasal spray, humidifier in bedroom. Regularly at gym.  Discussed antihistamines vs decongestants and use as needed during Spring pollen.  06/23/14- 63 yoF followed for allergic asthma, allergic rhinitis complicated by GERD, CAD/MI/ pacemaker, hx L mastectomy Former patient-last seen 05-2011 for asthma.  She returns now concerned about increased dyspnea on exertion with steps and stairs, especially in the 10 days or so since pollen has been especially heavy. Mentions occasional shortness of breath and cough relieved by cough syrup and made worse by strong odors, pollen etc. Some wheezing. We sent prednisone and a Z-Pak last week which helped. CXR 11/19/13 IMPRESSION: 1. Interim placement of cardiac pacer. Lead tips in right atrium and right ventricle. No cardiomegaly. 2. No acute pulmonary disease. 3. Left mastectomy and axillary dissection. 4. Cholecystectomy . Electronically Signed  By: Marcello Moores Register  On: 11/19/2013 07:55  02/23/2015-81 year old female never smoker followed for allergic asthma, allergic rhinitis,, located by GERD, CAD/MI/pacemaker, history left mastectomy FOLLOWS FOR: Pt states that she triggered her asthma x 3 weeks ago. Pt c/o increased deep cough, congestion, sinus drainage with white thick mucus, wheezing and SOB. Pt completed course of Prednisone 5mg , Mucinex and Asmanex. Denies fever. Has Zpak on hand. Recent exacerbation includes onset with sore throat and nasal congestion suggesting viral upper respiratory infection but she had also been working under her house in her crawl space. Thick white mucus without fever or chills. Chest tightness and wheeze noted. This is the  first significant exacerbations since last OV here.  ROS-see HPI Constitutional:   No-   weight loss, night sweats, fevers, chills, fatigue, lassitude. HEENT:   No-  headaches, difficulty swallowing, tooth/dental problems, sore throat,       No-  sneezing, itching, ear ache+ left,  +nasal congestion, post nasal drip,  CV:  No-   chest pain, orthopnea, PND, swelling in lower extremities, anasarca, dizziness, palpitations Resp: +shortness of breath with exertion or at rest.              + productive cough,  + non-productive cough,  No- coughing up of blood.              No-   change in color of mucus.  No- wheezing.   Skin: No-   rash or lesions. GI:  +heartburn, indigestion, abdominal pain, nausea, vomiting, GU: No-   dysuria,  MS:  No-   joint pain or swelling.  No- decreased range of motion.  No- back pain. Neuro-     nothing unusual Psych:  No- change in mood or affect. No depression or anxiety.  No memory loss.  OBJ- Physical Exam General- Alert, Oriented, Affect-appropriate, Distress- none acute Skin- rash-none, lesions- none, excoriation- none Lymphadenopathy- none Head- atraumatic            Eyes- Gross vision intact, PERRLA, conjunctivae and secretions clear            Ears- Hearing, canals-normal            Nose- Clear, no-Septal dev, mucus, polyps, erosion, perforation             Throat- Mallampati II , mucosa clear , drainage- none, tonsils- atrophic Neck- flexible , trachea midline, no stridor , thyroid nl, carotid  no bruit Chest - symmetrical excursion , unlabored           Heart/CV- RRR , no murmur , no gallop  , no rub, nl s1 s2                           - JVD- none , edema- none, stasis changes- none, varices- none           Lung- clear to P&A, wheeze- none, cough+ light , dullness-none, rub- none           Chest wall-  Abd Br/ Gen/ Rectal- Not done, not indicated Extrem- cyanosis- none, clubbing, none, atrophy- none, strength- nl Neuro- tremor

## 2015-02-23 NOTE — Patient Instructions (Signed)
Neb xop 0.63  Depo 59  Ok to hold the Zpak in case needed  Sample Breo Ellipta 100      Inhale 1 puff, then rinse mouth, once daily. Use this instead of Asmanex  Ok to continue gargles, Vick's and other symptom remedies as you find helpful  Ok to use Delsym otc cough med if needed

## 2015-03-03 ENCOUNTER — Encounter: Payer: Self-pay | Admitting: Internal Medicine

## 2015-04-06 ENCOUNTER — Ambulatory Visit (INDEPENDENT_AMBULATORY_CARE_PROVIDER_SITE_OTHER): Payer: Commercial Managed Care - HMO | Admitting: *Deleted

## 2015-04-06 ENCOUNTER — Telehealth: Payer: Self-pay | Admitting: Cardiology

## 2015-04-06 DIAGNOSIS — I495 Sick sinus syndrome: Secondary | ICD-10-CM

## 2015-04-06 NOTE — Telephone Encounter (Signed)
LMOVM reminding pt to send remote transmission.   

## 2015-04-07 NOTE — Progress Notes (Signed)
Remote pacemaker transmission.   

## 2015-04-17 LAB — CUP PACEART REMOTE DEVICE CHECK
Battery Impedance: 112 Ohm
Battery Voltage: 2.79 V
Brady Statistic AP VP Percent: 0 %
Brady Statistic AP VS Percent: 82 %
Brady Statistic AS VP Percent: 0 %
Brady Statistic AS VS Percent: 18 %
Date Time Interrogation Session: 20170111191316
Implantable Lead Implant Date: 20150826
Implantable Lead Location: 753859
Implantable Lead Model: 5076
Implantable Lead Model: 5076
Lead Channel Impedance Value: 428 Ohm
Lead Channel Impedance Value: 657 Ohm
Lead Channel Pacing Threshold Pulse Width: 0.4 ms
Lead Channel Pacing Threshold Pulse Width: 0.4 ms
Lead Channel Sensing Intrinsic Amplitude: 11.2 mV
Lead Channel Setting Sensing Sensitivity: 5.6 mV
MDC IDC LEAD IMPLANT DT: 20150826
MDC IDC LEAD LOCATION: 753860
MDC IDC MSMT BATTERY REMAINING LONGEVITY: 139 mo
MDC IDC MSMT LEADCHNL RA PACING THRESHOLD AMPLITUDE: 0.375 V
MDC IDC MSMT LEADCHNL RV PACING THRESHOLD AMPLITUDE: 0.625 V
MDC IDC SET LEADCHNL RA PACING AMPLITUDE: 2 V
MDC IDC SET LEADCHNL RV PACING AMPLITUDE: 2.5 V
MDC IDC SET LEADCHNL RV PACING PULSEWIDTH: 0.4 ms

## 2015-04-22 ENCOUNTER — Encounter: Payer: Self-pay | Admitting: Cardiology

## 2015-07-06 ENCOUNTER — Telehealth: Payer: Self-pay | Admitting: Cardiology

## 2015-07-06 ENCOUNTER — Ambulatory Visit (INDEPENDENT_AMBULATORY_CARE_PROVIDER_SITE_OTHER): Payer: Commercial Managed Care - HMO | Admitting: *Deleted

## 2015-07-06 DIAGNOSIS — Z950 Presence of cardiac pacemaker: Secondary | ICD-10-CM | POA: Diagnosis not present

## 2015-07-06 DIAGNOSIS — I495 Sick sinus syndrome: Secondary | ICD-10-CM

## 2015-07-06 NOTE — Telephone Encounter (Signed)
Spoke with pt and reminded pt of remote transmission that is due today. Pt verbalized understanding.   

## 2015-07-06 NOTE — Progress Notes (Signed)
Remote pacemaker transmission.   

## 2015-07-11 ENCOUNTER — Telehealth: Payer: Self-pay

## 2015-07-11 NOTE — Telephone Encounter (Signed)
Cardiac clearance placed in MR nurse fax box to be faxed to [facility] attn: Orson Slick

## 2015-07-14 ENCOUNTER — Encounter: Payer: Self-pay | Admitting: Internal Medicine

## 2015-07-14 ENCOUNTER — Ambulatory Visit (INDEPENDENT_AMBULATORY_CARE_PROVIDER_SITE_OTHER): Payer: Commercial Managed Care - HMO | Admitting: Internal Medicine

## 2015-07-14 VITALS — BP 124/86 | HR 66 | Ht 64.0 in | Wt 177.0 lb

## 2015-07-14 DIAGNOSIS — E785 Hyperlipidemia, unspecified: Secondary | ICD-10-CM | POA: Diagnosis not present

## 2015-07-14 DIAGNOSIS — I2510 Atherosclerotic heart disease of native coronary artery without angina pectoris: Secondary | ICD-10-CM | POA: Diagnosis not present

## 2015-07-14 DIAGNOSIS — I10 Essential (primary) hypertension: Secondary | ICD-10-CM | POA: Diagnosis not present

## 2015-07-14 NOTE — Patient Instructions (Addendum)
Your physician recommends that you continue on your current medications as directed. Please refer to the Current Medication list given to you today. Your physician wants you to follow-up in: 9 MONTHS WITH DR ROSS.  You will receive a reminder letter in the mail two months in advance. If you don't receive a letter, please call our office to schedule the follow-up appointment.  

## 2015-07-14 NOTE — Progress Notes (Signed)
Cardiology Office Note   Date:  07/14/2015   ID:  Madison [name], Madison [name] [birth year], MRN [id number]  PCP:  Jerlyn Ly, MD  Cardiologist:   Dorris Carnes, MD   No chief complaint on file.  F/U of CAD     History of Present Illness: Madison [name] is a 82 y.o. female with a history of mild to mod CAD  Was followed by [facility] in past  LVEF normal with mild diastolic dysfunction  Cardiopulm stress testing with ? Small vessel dz vs deconditioning Myovue in 2015 normal   Pt also s/p PPM  \ Last year seen in ER for dizziness Echo neg  CT neg  Dopplers nge    Since seen she has done better  No Dizzines Breathing is OK  ON CP   Being eval for surgery.  Sparking in chest gone   PPM interrogated  Some adjustments made Breathig is OK  Some SOB if up and down hill at [place]  Has allergies Up and down stairs No issues     Outpatient Prescriptions Prior to Visit  Medication Sig Dispense Refill  . Alum Hydroxide-Mag Carbonate (GAVISCON PO) Take 1 tablet by mouth daily as needed (heartburn).     Marland Kitchen aspirin 81 MG tablet Take 81 mg by mouth daily. Pt states she takes about 3-4 tablets a week with a meal. (02/23/14)    . atenolol (TENORMIN) 25 MG tablet Take 25 mg by mouth daily.     . Calcium Carbonate-Vit D-Min (CALTRATE PLUS PO) Take 1 tablet by mouth daily.     . cholecalciferol (VITAMIN D) 1000 UNITS tablet Take 1,000 Units by mouth daily.    . fish oil-omega-3 fatty acids 1000 MG capsule Take 2 g by mouth every other day.     . Fluticasone Furoate-Vilanterol (BREO ELLIPTA) 100-25 MCG/INH AEPB Inhale 1 puff then rinse mouth once daily 1 each 0  . levalbuterol (XOPENEX) 0.63 MG/3ML nebulizer solution Take 3 mLs (0.63 mg total) by nebulization 3 (three) times daily as needed for wheezing or shortness of breath. 75 mL 11  . losartan (COZAAR) 25 MG tablet Take 25 mg by mouth daily.     . Multiple Vitamin (MULTIVITAMIN) tablet Take 1 tablet by mouth daily.    . nitroGLYCERIN (NITROSTAT) 0.4 MG SL tablet  Place 1 tablet (0.4 mg total) under the tongue every 5 (five) minutes as needed for chest pain. (Patient taking differently: Place 0.4 mg under the tongue every 5 (five) minutes as needed for chest pain (MAX 3 TABLETS). ) 25 tablet 3  . Probiotic Product (PROBIOTIC PO) Take 1 tablet by mouth daily.    . Psyllium (METAMUCIL PO) Take one (1) tablespoon by mouth daily.    . vitamin B-12 (CYANOCOBALAMIN) 1000 MCG tablet Take 2,000 mcg by mouth daily.    . vitamin E 400 UNIT capsule Take 400 Units by mouth daily.    . Pitavastatin Calcium (LIVALO) 4 MG TABS Take 2 mg by mouth every other day.     No facility-administered medications prior to visit.     Allergies:   Prednisone; Tape; Codeine; Epinephrine; Penicillins; Percodan; and Pravastatin   Past Medical History  Diagnosis Date  . ALLERGIC RHINITIS   . Asthma   . Cough   . Acute myocardial infarction, unspecified site, episode of care unspecified   . Dysfunction of eustachian tube   . Acquired absence of breast and nipple   . Esophageal reflux   . Coronary atherosclerosis   .  PVC's (premature ventricular contractions)   . Hypertension   . Hyperlipidemia   . Breast cancer ([place])     left mastectomy  . Syncope 11/17/2013  . Complication of anesthesia   . PONV (postoperative nausea and vomiting)   . Anginal pain ([place])   . Heart murmur   . Peripheral vascular disease ([place])   . Shortness of breath   . Arthritis     Past Surgical History  Procedure Laterality Date  . Abdominal hysterectomy  1967  . Dilation and curettage of uterus  1962-1968    x4  . Gallbladder surgery  1985  . Placement of breast implants  1993    [facility]  . Myomectomy  1968  . Transthoracic echocardiogram  2014    EF 88-91%, grade 1 diastolic dysfunction; mildly thickened MV leaflets, trivial regurg   . Nm myocar perf wall motion  2012    bruce myoview -no inducible ischemia, EF 71%, low risk scan  . Cardiac catheterization  03/01/1986    normal coronaries  (Dr. Domenic Moras)  . Cardiac catheterization  10/25/2002    normal L main; LAD w/40% narrowing in prox 3rd and 60-70% narrowing beyond 1st diagonal, LAD was tortuous; dominant RCA with 30-40% segmental narrowing and 20-30% narrowing at junction of prox 3rd (Dr. Marella Chimes)  . Cardiac catheterization  04/11/2006    trivial luminal irregularities in coronaries and mid LAD 50% (Dr. Domenic Moras)  . Carotid doppler  2005    normal study (ordered for swelling & pain, left neck clavicle to ear)  . Cardiopulmonary met test  05/05/2012    excellent effort w/RER 1.06, peak VO2>100%, peak HR 81%, good functional capacity  . Mastectomy Left 1981  . Permanent pacemaker insertion N/A 11/18/2013    Procedure: PERMANENT PACEMAKER INSERTION;  Surgeon: Evans Lance, MD;  Location: [facility] CATH LAB;  Service: Cardiovascular;  Laterality: N/A;     Social History:  The patient  reports that she has never smoked. She has never used smokeless tobacco. She reports that she drinks alcohol. She reports that she does not use illicit drugs.   Family History:  The patient's family history includes CAD in her brother and brother; Cancer in her brother, maternal grandfather, mother, and sister; Cervical cancer in her sister; Colon cancer in her sister; Colon cancer (age of onset: 40) in her mother; Heart attack in her brother; Heart disease in her maternal grandfather, maternal grandmother, mother, and paternal grandmother; Liver cancer in her brother; Lymphoma in her sister; Stroke in her brother, brother, maternal grandmother, mother, and paternal grandfather.    ROS:  Please see the history of present illness. All other systems are reviewed and  Negative to the above problem except as noted.    PHYSICAL EXAM: VS:  BP 124/86 mmHg  Pulse 66  Ht _0  (1.626 m)  Wt 177 lb (80.287 kg)  BMI 30.37 kg/m2  GEN: Well nourished, well developed, in no acute distress HEENT: normal Neck: no JVD, carotid bruits, or masses Cardiac: RRR;  no murmurs, rubs, or gallops,no edema  Respiratory:  clear to auscultation bilaterally, normal work of breathing GI: soft, nontender, nondistended, + BS  No hepatomegaly  MS: no deformity Moving all extremities   Skin: warm and dry, no rash Neuro:  Strength and sensation are intact Psych: euthymic mood, full affect   EKG:  EKG is ordered today.  Atrial paced  66 bpm  PR 260 msec    Lipid Panel  Component Value Date/Time   CHOL 176 10/28/2014 0440   TRIG 126 10/28/2014 0440   HDL 42 10/28/2014 0440   CHOLHDL 4.2 10/28/2014 0440   VLDL 25 10/28/2014 0440   LDLCALC 109* 10/28/2014 0440      Wt Readings from Last 3 Encounters:  07/14/15 177 lb (80.287 kg)  02/23/15 178 lb (80.74 kg)  01/05/15 183 lb (83.008 kg)      ASSESSMENT AND PLAN:  1  CAd  No symptoms to suggest active angina  I would keep on same meds  Overall I think pt is at low risk for a major cardiac event and OK to proceed with surgery  2  HTN  Adequate control  3  HL  Will get lipids from primary MD  4.  PPPM  Followed in clinic    I will set fu in Winter      Signed, Dorris Carnes, MD  07/14/2015 12:21 PM    [address], [place], [place]  [address] Phone: [phone]; Fax: [phone]

## 2015-07-18 NOTE — Patient Instructions (Signed)
Madison [name]  07/18/2015   Your procedure is scheduled on: 08/02/2015    Report to [facility] Main  Entrance take [facility]  elevators to 3rd floor to  [place] at  1120 AM.  Call this number if you have problems the morning of surgery [phone]   Remember: ONLY 1 PERSON MAY GO WITH YOU TO SHORT STAY TO GET  READY MORNING OF [place].  Do not eat food after midnite.  May have clear liquids from 21midnite until 0730am morning of surgery then nothing by mouth.       Take these medicines the morning of surgery with A SIP OF WATER: Atenolol ( tenormin), Breo Ellipta if needed and bring, systane eye drops if needed                                You may not have any metal on your body including hair pins and              piercings  Do not wear jewelry, make-up, lotions, powders or perfumes, deodorant             Do not wear nail polish.  Do not shave  48 hours prior to surgery.            Do not bring valuables to the hospital. [place].  Contacts, dentures or bridgework may not be worn into surgery.  Leave suitcase in the car. After surgery it may be brought to your room.         Special Instructions: coughing and deep breathing exercises, leg exercises               Please read over the following fact sheets you were given: _____________________________________________________________________                CLEAR LIQUID DIET   Foods Allowed                                                                     Foods Excluded  Coffee and tea, regular and decaf                             liquids that you cannot  Plain Jell-O in any flavor                                             see through such as: Fruit ices (not with fruit pulp)                                     milk, soups, orange juice  Iced Popsicles  All solid food Carbonated beverages, regular  and diet                                    Cranberry, grape and apple juices Sports drinks like Gatorade Lightly seasoned clear broth or consume(fat free) Sugar, honey syrup  Sample Menu Breakfast                                Lunch                                     Supper Cranberry juice                    Beef broth                            Chicken broth Jell-O                                     Grape juice                           Apple juice Coffee or tea                        Jell-O                                      Popsicle                                                Coffee or tea                        Coffee or tea  _____________________________________________________________________  [facility] - Preparing for Surgery Before surgery, you can play an important role.  Because skin is not sterile, your skin needs to be as free of germs as possible.  You can reduce the number of germs on your skin by washing with CHG (chlorahexidine gluconate) soap before surgery.  CHG is an antiseptic cleaner which kills germs and bonds with the skin to continue killing germs even after washing. Please DO NOT use if you have an allergy to CHG or antibacterial soaps.  If your skin becomes reddened/irritated stop using the CHG and inform your nurse when you arrive at Short Stay. Do not shave (including legs and underarms) for at least 48 hours prior to the first CHG shower.  You may shave your face/neck. Please follow these instructions carefully:  1.  Shower with CHG Soap the night before surgery and the  morning of Surgery.  2.  If you choose to wash your hair, wash your hair first as usual with your  normal  shampoo.  3.  After you shampoo, rinse your hair and body thoroughly to remove the  shampoo.  4.  Use CHG as you would any other liquid soap.  You can apply chg directly  to the skin and wash                       Gently with a scrungie or clean  washcloth.  5.  Apply the CHG Soap to your body ONLY FROM THE NECK DOWN.   Do not use on face/ open                           Wound or open sores. Avoid contact with eyes, ears mouth and genitals (private parts).                       Wash face,  Genitals (private parts) with your normal soap.             6.  Wash thoroughly, paying special attention to the area where your surgery  will be performed.  7.  Thoroughly rinse your body with warm water from the neck down.  8.  DO NOT shower/wash with your normal soap after using and rinsing off  the CHG Soap.                9.  Pat yourself dry with a clean towel.            10.  Wear clean pajamas.            11.  Place clean sheets on your bed the night of your first shower and do not  sleep with pets. Day of Surgery : Do not apply any lotions/deodorants the morning of surgery.  Please wear clean clothes to the hospital/surgery center.  FAILURE TO FOLLOW THESE INSTRUCTIONS MAY RESULT IN THE CANCELLATION OF YOUR SURGERY PATIENT SIGNATURE_________________________________  NURSE SIGNATURE__________________________________  ________________________________________________________________________  WHAT IS A BLOOD TRANSFUSION? Blood Transfusion Information  A transfusion is the replacement of blood or some of its parts. Blood is made up of multiple cells which provide different functions.  Red blood cells carry oxygen and are used for blood loss replacement.  White blood cells fight against infection.  Platelets control bleeding.  Plasma helps clot blood.  Other blood products are available for specialized needs, such as hemophilia or other clotting disorders. BEFORE THE TRANSFUSION  Who gives blood for transfusions?   Healthy volunteers who are fully evaluated to make sure their blood is safe. This is blood bank blood. Transfusion therapy is the safest it has ever been in the practice of medicine. Before blood is taken from a donor, a  complete history is taken to make sure that person has no history of diseases nor engages in risky social behavior (examples are intravenous drug use or sexual activity with multiple partners). The donor's travel history is screened to minimize risk of transmitting infections, such as malaria. The donated blood is tested for signs of infectious diseases, such as HIV and hepatitis. The blood is then tested to be sure it is compatible with you in order to minimize the chance of a transfusion reaction. If you or a relative donates blood, this is often done in anticipation of surgery and is not appropriate for emergency situations. It takes many days to process the donated blood. RISKS AND COMPLICATIONS Although transfusion therapy is very safe and saves many lives, the main dangers of transfusion include:  1. Getting an infectious disease. 2. Developing a transfusion reaction.  This is an allergic reaction to something in the blood you were given. Every precaution is taken to prevent this. The decision to have a blood transfusion has been considered carefully by your caregiver before blood is given. Blood is not given unless the benefits outweigh the risks. AFTER THE TRANSFUSION  Right after receiving a blood transfusion, you will usually feel much better and more energetic. This is especially true if your red blood cells have gotten low (anemic). The transfusion raises the level of the red blood cells which carry oxygen, and this usually causes an energy increase.  The nurse administering the transfusion will monitor you carefully for complications. HOME CARE INSTRUCTIONS  No special instructions are needed after a transfusion. You may find your energy is better. Speak with your caregiver about any limitations on activity for underlying diseases you may have. SEEK MEDICAL CARE IF:   Your condition is not improving after your transfusion.  You develop redness or irritation at the intravenous (IV)  site. SEEK IMMEDIATE MEDICAL CARE IF:  Any of the following symptoms occur over the next 12 hours:  Shaking chills.  You have a temperature by mouth above 102 F (38.9 C), not controlled by medicine.  Chest, back, or muscle pain.  People around you feel you are not acting correctly or are confused.  Shortness of breath or difficulty breathing.  Dizziness and fainting.  You get a rash or develop hives.  You have a decrease in urine output.  Your urine turns a dark color or changes to pink, red, or brown. Any of the following symptoms occur over the next 10 days:  You have a temperature by mouth above 102 F (38.9 C), not controlled by medicine.  Shortness of breath.  Weakness after normal activity.  The white part of the eye turns yellow (jaundice).  You have a decrease in the amount of urine or are urinating less often.  Your urine turns a dark color or changes to pink, red, or brown. Document Released: 03/09/2000 Document Revised: 06/04/2011 Document Reviewed: 10/27/2007 ExitCare Patient Information 2014 [place].  _______________________________________________________________________  Incentive Spirometer  An incentive spirometer is a tool that can help keep your lungs clear and active. This tool measures how well you are filling your lungs with each breath. Taking long deep breaths may help reverse or decrease the chance of developing breathing (pulmonary) problems (especially infection) following:  A long period of time when you are unable to move or be active. BEFORE THE PROCEDURE   If the spirometer includes an indicator to show your best effort, your nurse or respiratory therapist will set it to a desired goal.  If possible, sit up straight or lean slightly forward. Try not to slouch.  Hold the incentive spirometer in an upright position. INSTRUCTIONS FOR USE  3. Sit on the edge of your bed if possible, or sit up as far as you can in bed or on a  chair. 4. Hold the incentive spirometer in an upright position. 5. Breathe out normally. 6. Place the mouthpiece in your mouth and seal your lips tightly around it. 7. Breathe in slowly and as deeply as possible, raising the piston or the ball toward the top of the column. 8. Hold your breath for 3-5 seconds or for as long as possible. Allow the piston or ball to fall to the bottom of the column. 9. Remove the mouthpiece from your mouth and breathe out normally. 10. Rest for a few seconds and repeat Steps  1 through 7 at least 10 times every 1-2 hours when you are awake. Take your time and take a few normal breaths between deep breaths. 11. The spirometer may include an indicator to show your best effort. Use the indicator as a goal to work toward during each repetition. 12. After each set of 10 deep breaths, practice coughing to be sure your lungs are clear. If you have an incision (the cut made at the time of surgery), support your incision when coughing by placing a pillow or rolled up towels firmly against it. Once you are able to get out of bed, walk around indoors and cough well. You may stop using the incentive spirometer when instructed by your caregiver.  RISKS AND COMPLICATIONS  Take your time so you do not get dizzy or light-headed.  If you are in pain, you may need to take or ask for pain medication before doing incentive spirometry. It is harder to take a deep breath if you are having pain. AFTER USE  Rest and breathe slowly and easily.  It can be helpful to keep track of a log of your progress. Your caregiver can provide you with a simple table to help with this. If you are using the spirometer at home, follow these instructions: [place] IF:   You are having difficultly using the spirometer.  You have trouble using the spirometer as often as instructed.  Your pain medication is not giving enough relief while using the spirometer.  You develop fever of 100.5 F  (38.1 C) or higher. SEEK IMMEDIATE MEDICAL CARE IF:   You cough up bloody sputum that had not been present before.  You develop fever of 102 F (38.9 C) or greater.  You develop worsening pain at or near the incision site. MAKE SURE YOU:   Understand these instructions.  Will watch your condition.  Will get help right away if you are not doing well or get worse. Document Released: 07/23/2006 Document Revised: 06/04/2011 Document Reviewed: 09/23/2006 [facility] Patient Information 2014 [place], [place].   ________________________________________________________________________

## 2015-07-20 ENCOUNTER — Encounter (HOSPITAL_COMMUNITY): Payer: Self-pay

## 2015-07-20 ENCOUNTER — Encounter (HOSPITAL_COMMUNITY)
Admission: RE | Admit: 2015-07-20 | Discharge: 2015-07-20 | Disposition: A | Discharge status: Home / Self Care | Payer: Commercial Managed Care - HMO | Source: Ambulatory Visit | Attending: Orthopedic Surgery | Admitting: Orthopedic Surgery

## 2015-07-20 ENCOUNTER — Ambulatory Visit (HOSPITAL_COMMUNITY)
Admission: RE | Admit: 2015-07-20 | Discharge: 2015-07-20 | Disposition: A | Discharge status: Home / Self Care | Payer: Commercial Managed Care - HMO | Source: Ambulatory Visit | Attending: Anesthesiology | Admitting: Anesthesiology

## 2015-07-20 DIAGNOSIS — Z9012 Acquired absence of left breast and nipple: Secondary | ICD-10-CM | POA: Insufficient documentation

## 2015-07-20 DIAGNOSIS — Z01818 Encounter for other preprocedural examination: Secondary | ICD-10-CM

## 2015-07-20 DIAGNOSIS — Z9049 Acquired absence of other specified parts of digestive tract: Secondary | ICD-10-CM | POA: Diagnosis not present

## 2015-07-20 HISTORY — DX: Blindness, one eye, unspecified eye: H54.40

## 2015-07-20 HISTORY — DX: Presence of cardiac pacemaker: Z95.0

## 2015-07-20 HISTORY — DX: Pneumonia, unspecified organism: J18.9

## 2015-07-20 HISTORY — DX: Unspecified hemorrhoids: K64.9

## 2015-07-20 HISTORY — DX: Stress incontinence (female) (male): N39.3

## 2015-07-20 LAB — BASIC METABOLIC PANEL
ANION GAP: 6 (ref 5–15)
BUN: 19 mg/dL (ref 6–20)
CHLORIDE: 106 mmol/L (ref 101–111)
CO2: 27 mmol/L (ref 22–32)
Calcium: 9.4 mg/dL (ref 8.9–10.3)
Creatinine, Ser: 1.15 mg/dL — ABNORMAL HIGH (ref 0.44–1.00)
GFR, EST AFRICAN AMERICAN: 51 mL/min — AB (ref >60)
GFR, EST NON AFRICAN AMERICAN: 44 mL/min — AB (ref >60)
Glucose, Bld: 85 mg/dL (ref 65–99)
POTASSIUM: 4.8 mmol/L (ref 3.5–5.1)
SODIUM: 139 mmol/L (ref 135–145)

## 2015-07-20 LAB — CBC
HEMATOCRIT: 38.9 % (ref 36.0–46.0)
HEMOGLOBIN: 13.1 g/dL (ref 12.0–15.0)
MCH: 28.5 pg (ref 26.0–34.0)
MCHC: 33.7 g/dL (ref 30.0–36.0)
MCV: 84.6 fL (ref 78.0–100.0)
Platelets: 197 10*3/uL (ref 150–400)
RBC: 4.6 MIL/uL (ref 3.87–5.11)
RDW: 12.2 % (ref 11.5–15.5)
WBC: 5.3 10*3/uL (ref 4.0–10.5)

## 2015-07-20 LAB — SURGICAL PCR SCREEN
MRSA, PCR: NEGATIVE
Staphylococcus aureus: NEGATIVE

## 2015-07-20 NOTE — Progress Notes (Signed)
EKG-07/14/15-EPIC  Last Device check- 04/06/15- EPIC  LOV- DR Lovena Le- 01/10/15- EPIC  12/18/13- Stress Test - EPIC  02/23/15- LOv- Pulmonary- EPIC  LOV- 07/14/15- Cardiology- EPIC  Clearance- Dr Joylene Draft- 07/18/15- chart  Clearance- Dr Sarina Ill- on chart

## 2015-07-20 NOTE — Progress Notes (Signed)
BMP and CXR results from 07/20/15 faxed via EPIC to Dr Alvan Dame.

## 2015-07-24 NOTE — H&P (Signed)
TOTAL HIP ADMISSION H&P  Patient is admitted for right total hip arthroplasty, anterior approach.  Subjective:  Chief Complaint: Right hip primary OA / pain  HPI: Madison [name], 82 y.o. female, has a history of pain and functional disability in the right hip(s) due to arthritis and patient has failed non-surgical conservative treatments for greater than 12 weeks to include NSAID's and/or analgesics, use of assistive devices and activity modification.  Onset of symptoms was gradual starting >10 years ago with gradually worsening course since that time.The patient noted no past surgery on the right hip(s).  Patient currently rates pain in the right hip at 7 out of 10 with activity. Patient has night pain, worsening of pain with activity and weight bearing, trendelenberg gait, pain that interfers with activities of daily living and pain with passive range of motion. Patient has evidence of periarticular osteophytes and joint space narrowing by imaging studies. This condition presents safety issues increasing the risk of falls.  There is no current active infection.   Risks, benefits and expectations were discussed with the patient.  Risks including but not limited to the risk of anesthesia, blood clots, nerve damage, blood vessel damage, failure of the prosthesis, infection and up to and including death.  Patient understand the risks, benefits and expectations and wishes to proceed with surgery.   PCP: Jerlyn Ly, MD  D/C Plans:      Home  [name]-op Meds:       No Rx given   Tranexamic Acid:      To be given - IV   Decadron:      Is to be given  FYI:     ASA  Tramadol and APAP    Patient Active Problem List   Diagnosis Date Noted  . Sinus node dysfunction ([place]) 01/05/2015  . Headache 10/28/2014  . Overweight (BMI 25.0-29.9) 10/28/2014  . Subjective visual disturbance, left eye 10/28/2014  . Glaucoma 10/28/2014  . Earache on left 06/23/2014  . CAD in native artery 12/23/2013  .  Pacemaker 12/23/2013  . CHB with near syncope 12/10/2013  . Cardiac pacemaker in situ- MDT 11/18/13 12/10/2013  . Near syncope 11/17/2013  . Sinus pause 11/17/2013  . Old MI (myocardial infarction) 11/17/2013  . HTN (hypertension) 11/13/2013  . Dyslipidemia 11/13/2013  . PVC's (premature ventricular contractions) 11/13/2013  . Acute myocardial infarction ([place]) 05/20/2008  . EUSTACHIAN TUBE DYSFUNCTION 01/11/2007  . CORONARY ARTERY DISEASE 01/11/2007  . ALLERGIC RHINITIS 01/11/2007  . Asthmatic bronchitis with exacerbation 01/11/2007  . GERD 01/11/2007  . MASTECTOMY, LEFT, HX OF 01/11/2007   Past Medical History  Diagnosis Date  . ALLERGIC RHINITIS   . Asthma   . Cough   . Dysfunction of eustachian tube   . Acquired absence of breast and nipple   . Esophageal reflux   . Coronary atherosclerosis   . PVC's (premature ventricular contractions)   . Hypertension   . Hyperlipidemia   . Breast cancer ([place])     left mastectomy  . Syncope 11/17/2013  . Complication of anesthesia   . PONV (postoperative nausea and vomiting)   . Anginal pain ([place])   . Heart murmur   . Peripheral vascular disease ([place])   . Shortness of breath   . Arthritis   . Presence of permanent cardiac pacemaker   . Acute myocardial infarction, unspecified site, episode of care unspecified     1965 and 2015   . Pneumonia     hx of walking pneumonia x  2   . Stress incontinence   . Hemorrhoids   . Blindness of left eye     decreased vision in left eye related to ocular occlusion     Past Surgical History  Procedure Laterality Date  . Abdominal hysterectomy  1967  . Dilation and curettage of uterus  1962-1968    x4  . Gallbladder surgery  1985  . Placement of breast implants  1993    [facility]  . Myomectomy  1968  . Transthoracic echocardiogram  2014    EF 85-88%, grade 1 diastolic dysfunction; mildly thickened MV leaflets, trivial regurg   . Nm myocar perf wall motion  2012    bruce myoview -no inducible  ischemia, EF 71%, low risk scan  . Cardiac catheterization  03/01/1986    normal coronaries (Dr. Domenic Moras)  . Cardiac catheterization  10/25/2002    normal L main; LAD w/40% narrowing in prox 3rd and 60-70% narrowing beyond 1st diagonal, LAD was tortuous; dominant RCA with 30-40% segmental narrowing and 20-30% narrowing at junction of prox 3rd (Dr. Marella Chimes)  . Cardiac catheterization  04/11/2006    trivial luminal irregularities in coronaries and mid LAD 50% (Dr. Domenic Moras)  . Carotid doppler  2005    normal study (ordered for swelling & pain, left neck clavicle to ear)  . Cardiopulmonary met test  05/05/2012    excellent effort w/RER 1.06, peak VO2>100%, peak HR 81%, good functional capacity  . Mastectomy Left 1981  . Permanent pacemaker insertion N/A 11/18/2013    Procedure: PERMANENT PACEMAKER INSERTION;  Surgeon: Evans Lance, MD;  Location: [facility] CATH LAB;  Service: Cardiovascular;  Laterality: N/A;  . Removal of bilateral tissue expanders with placement of bilateral breast implants      No prescriptions prior to admission   Allergies  Allergen Reactions  . Tape Other (See Comments)    PAPER TAPE -Raw, itching, bleeding skin  . Codeine Nausea And Vomiting and Other (See Comments)  . Epinephrine Other (See Comments)    Enhances longevity of Novocaine - severe heart palpatations   . Other Nausea And Vomiting    Madison [name]  . Penicillins Other (See Comments)    Heart palpitations Has patient had a PCN reaction causing immediate rash, facial/tongue/throat swelling, SOB or lightheadedness with hypotension: no Has patient had a PCN reaction causing severe rash involving mucus membranes or skin necrosis: no Has patient had a PCN reaction that required hospitalization yes - caused her to get a heart catheterization Has patient had a PCN reaction occurring within the last 10 years: about 22 years ago If all of the above answers are "NO", then may proceed with C  . Percodan [Oxycodone-Aspirin]  Nausea And Vomiting  . Pravastatin Other (See Comments)    Statins cause cramps    Social History  Substance Use Topics  . Smoking status: Never Smoker   . Smokeless tobacco: Never Used  . Alcohol Use: Yes     Comment: 2 glasses of wine with dinner occasional     Family History  Problem Relation Age of Onset  . Heart attack Brother   . Stroke Brother   . CAD Brother     + stents  . CAD Brother     + stents  . Stroke Brother   . Lymphoma Sister   . Colon cancer Sister   . Cervical cancer Sister   . Liver cancer Brother   . Heart disease Mother   . Stroke Mother   .  Colon cancer Mother 1  . Heart disease Maternal Grandmother   . Stroke Maternal Grandmother   . Heart disease Maternal Grandfather   . Cancer Maternal Grandfather   . Heart disease Paternal Grandmother   . Stroke Paternal Grandfather   . Cancer Mother   . Cancer Sister   . Cancer Brother      Review of Systems  Constitutional: Negative.   Eyes: Negative.   Respiratory: Positive for shortness of breath (with exertion).   Cardiovascular: Negative.   Gastrointestinal: Positive for heartburn.  Genitourinary: Positive for frequency.  Musculoskeletal: Positive for joint pain.  Skin: Negative.   Neurological: Positive for headaches.  Endo/Heme/Allergies: Positive for environmental allergies.  Psychiatric/Behavioral: Negative.     Objective:  Physical Exam  Constitutional: She is oriented to person, place, and time. She appears well-developed.  HENT:  Head: Normocephalic.  Eyes: Pupils are equal, round, and reactive to light.  Neck: Neck supple. No JVD present. No tracheal deviation present. No thyromegaly present.  Cardiovascular: Normal rate, regular rhythm, normal heart sounds and intact distal pulses.   Respiratory: Effort normal and breath sounds normal. No stridor. No respiratory distress. She has no wheezes.  GI: Soft. There is no tenderness. There is no guarding.  Musculoskeletal:       Right  hip: She exhibits decreased range of motion, decreased strength, tenderness and bony tenderness. She exhibits no swelling, no deformity and no laceration.  Lymphadenopathy:    She has no cervical adenopathy.  Neurological: She is alert and oriented to person, place, and time.  Skin: Skin is warm and dry.  Psychiatric: She has a normal mood and affect.     Labs:  Estimated body mass index is 30.54 kg/(m^2) as calculated from the following:   Height as of 02/23/15: 5' 4"  (1.626 m).   Weight as of 02/23/15: 80.74 kg (178 lb).   Imaging Review Plain radiographs demonstrate severe degenerative joint disease of the right hip(s). The bone quality appears to be good for age and reported activity level.  Assessment/Plan:  End stage arthritis, right hip(s)  The patient history, physical examination, clinical judgement of the provider and imaging studies are consistent with end stage degenerative joint disease of the bilaterally hip(s) and total hip arthroplasty is deemed medically necessary. The treatment options including medical management, injection therapy, arthroscopy and arthroplasty were discussed at length. The risks and benefits of total hip arthroplasty were presented and reviewed. The risks due to aseptic loosening, infection, stiffness, dislocation/subluxation,  thromboembolic complications and other imponderables were discussed.  The patient acknowledged the explanation, agreed to proceed with the plan and consent was signed. Patient is being admitted for inpatient treatment for surgery, pain control, PT, OT, prophylactic antibiotics, VTE prophylaxis, progressive ambulation and ADL's and discharge planning.The patient is planning to be discharged home with home health services.    [place] Pugh Ileigh Mettler   PA-C  07/24/2015, 9:59 PM

## 2015-08-02 ENCOUNTER — Inpatient Hospital Stay (HOSPITAL_COMMUNITY): Payer: Commercial Managed Care - HMO | Admitting: Certified Registered Nurse Anesthetist

## 2015-08-02 ENCOUNTER — Encounter (HOSPITAL_COMMUNITY)
Admission: RE | Disposition: A | Discharge status: Home Health | Payer: Self-pay | Source: Ambulatory Visit | Attending: Orthopedic Surgery

## 2015-08-02 ENCOUNTER — Inpatient Hospital Stay (HOSPITAL_COMMUNITY): Payer: Commercial Managed Care - HMO

## 2015-08-02 ENCOUNTER — Inpatient Hospital Stay (HOSPITAL_COMMUNITY)
Admission: RE | Admit: 2015-08-02 | Discharge: 2015-08-04 | DRG: 470 | Disposition: A | Discharge status: Home Health | Payer: Commercial Managed Care - HMO | Source: Ambulatory Visit | Attending: Orthopedic Surgery | Admitting: Orthopedic Surgery

## 2015-08-02 ENCOUNTER — Encounter (HOSPITAL_COMMUNITY): Payer: Self-pay | Admitting: *Deleted

## 2015-08-02 DIAGNOSIS — I10 Essential (primary) hypertension: Secondary | ICD-10-CM | POA: Diagnosis present

## 2015-08-02 DIAGNOSIS — M1611 Unilateral primary osteoarthritis, right hip: Secondary | ICD-10-CM | POA: Diagnosis present

## 2015-08-02 DIAGNOSIS — Z8249 Family history of ischemic heart disease and other diseases of the circulatory system: Secondary | ICD-10-CM | POA: Diagnosis not present

## 2015-08-02 DIAGNOSIS — Z800 Family history of malignant neoplasm of digestive organs: Secondary | ICD-10-CM

## 2015-08-02 DIAGNOSIS — H409 Unspecified glaucoma: Secondary | ICD-10-CM | POA: Diagnosis present

## 2015-08-02 DIAGNOSIS — I252 Old myocardial infarction: Secondary | ICD-10-CM | POA: Diagnosis not present

## 2015-08-02 DIAGNOSIS — M25551 Pain in right hip: Secondary | ICD-10-CM | POA: Diagnosis present

## 2015-08-02 DIAGNOSIS — E663 Overweight: Secondary | ICD-10-CM | POA: Diagnosis present

## 2015-08-02 DIAGNOSIS — E785 Hyperlipidemia, unspecified: Secondary | ICD-10-CM | POA: Diagnosis present

## 2015-08-02 DIAGNOSIS — Z853 Personal history of malignant neoplasm of breast: Secondary | ICD-10-CM

## 2015-08-02 DIAGNOSIS — I2510 Atherosclerotic heart disease of native coronary artery without angina pectoris: Secondary | ICD-10-CM | POA: Diagnosis present

## 2015-08-02 DIAGNOSIS — K219 Gastro-esophageal reflux disease without esophagitis: Secondary | ICD-10-CM | POA: Diagnosis present

## 2015-08-02 DIAGNOSIS — Z6829 Body mass index (BMI) 29.0-29.9, adult: Secondary | ICD-10-CM

## 2015-08-02 DIAGNOSIS — I739 Peripheral vascular disease, unspecified: Secondary | ICD-10-CM | POA: Diagnosis present

## 2015-08-02 DIAGNOSIS — Z950 Presence of cardiac pacemaker: Secondary | ICD-10-CM

## 2015-08-02 DIAGNOSIS — Z880 Allergy status to penicillin: Secondary | ICD-10-CM | POA: Diagnosis not present

## 2015-08-02 DIAGNOSIS — H5442 Blindness, left eye, normal vision right eye: Secondary | ICD-10-CM | POA: Diagnosis present

## 2015-08-02 DIAGNOSIS — Z823 Family history of stroke: Secondary | ICD-10-CM

## 2015-08-02 DIAGNOSIS — Z9882 Breast implant status: Secondary | ICD-10-CM

## 2015-08-02 DIAGNOSIS — Z9010 Acquired absence of unspecified breast and nipple: Secondary | ICD-10-CM | POA: Diagnosis not present

## 2015-08-02 DIAGNOSIS — Z96649 Presence of unspecified artificial hip joint: Secondary | ICD-10-CM

## 2015-08-02 DIAGNOSIS — Z90710 Acquired absence of both cervix and uterus: Secondary | ICD-10-CM | POA: Diagnosis not present

## 2015-08-02 HISTORY — PX: TOTAL HIP ARTHROPLASTY: SHX124

## 2015-08-02 LAB — ABO/RH: ABO/RH(D): B NEG

## 2015-08-02 LAB — TYPE AND SCREEN
ABO/RH(D): B NEG
ANTIBODY SCREEN: NEGATIVE

## 2015-08-02 SURGERY — ARTHROPLASTY, HIP, TOTAL, ANTERIOR APPROACH
Anesthesia: Spinal | Site: Hip | Laterality: Right

## 2015-08-02 MED ORDER — SODIUM CHLORIDE 0.9 % IR SOLN
Status: DC | PRN
  Administered 2015-08-02: 1000 mL

## 2015-08-02 MED ORDER — CHLORHEXIDINE GLUCONATE 4 % EX LIQD: 60.0000 mL | Freq: Once | CUTANEOUS | Status: DC

## 2015-08-02 MED ORDER — SODIUM CHLORIDE 0.9 % IV SOLN
INTRAVENOUS | Status: DC
  Administered 2015-08-02: 18:00:00 via INTRAVENOUS

## 2015-08-02 MED ORDER — PROPOFOL 10 MG/ML IV BOLUS
INTRAVENOUS | Status: AC
  Filled 2015-08-02: qty 20

## 2015-08-02 MED ORDER — HYDROMORPHONE HCL 1 MG/ML IJ SOLN
0.5000 mg | INTRAMUSCULAR | Status: DC | PRN
  Administered 2015-08-02: 0.5 mg via INTRAVENOUS
  Filled 2015-08-02: qty 1

## 2015-08-02 MED ORDER — PROPOFOL 10 MG/ML IV BOLUS
INTRAVENOUS | Status: DC | PRN
  Administered 2015-08-02: 20 mg via INTRAVENOUS
  Administered 2015-08-02: 30 mg via INTRAVENOUS

## 2015-08-02 MED ORDER — DIPHENHYDRAMINE HCL 50 MG/ML IJ SOLN
INTRAMUSCULAR | Status: AC
  Filled 2015-08-02: qty 1

## 2015-08-02 MED ORDER — NITROGLYCERIN 0.4 MG SL SUBL: 0.4000 mg | SUBLINGUAL_TABLET | SUBLINGUAL | Status: DC | PRN

## 2015-08-02 MED ORDER — ONDANSETRON HCL 4 MG PO TABS
4.0000 mg | ORAL_TABLET | Freq: Four times a day (QID) | ORAL | Status: DC | PRN
  Administered 2015-08-03: 4 mg via ORAL
  Filled 2015-08-02: qty 1

## 2015-08-02 MED ORDER — METOCLOPRAMIDE HCL 5 MG PO TABS: 5.0000 mg | ORAL_TABLET | Freq: Three times a day (TID) | ORAL | Status: DC | PRN

## 2015-08-02 MED ORDER — STERILE WATER FOR IRRIGATION IR SOLN
Status: DC | PRN
  Administered 2015-08-02: 2000 mL

## 2015-08-02 MED ORDER — DEXAMETHASONE SODIUM PHOSPHATE 10 MG/ML IJ SOLN
INTRAMUSCULAR | Status: DC | PRN
  Administered 2015-08-02: 10 mg via INTRAVENOUS

## 2015-08-02 MED ORDER — CELECOXIB 200 MG PO CAPS
200.0000 mg | ORAL_CAPSULE | Freq: Two times a day (BID) | ORAL | Status: DC
  Filled 2015-08-02 (×2): qty 1

## 2015-08-02 MED ORDER — HYDROMORPHONE HCL 1 MG/ML IJ SOLN
0.5000 mg | INTRAMUSCULAR | Status: DC | PRN
  Administered 2015-08-02 (×2): 0.5 mg via INTRAVENOUS

## 2015-08-02 MED ORDER — ATENOLOL 25 MG PO TABS
25.0000 mg | ORAL_TABLET | Freq: Every day | ORAL | Status: DC
  Filled 2015-08-02 (×2): qty 1

## 2015-08-02 MED ORDER — DEXAMETHASONE SODIUM PHOSPHATE 10 MG/ML IJ SOLN
10.0000 mg | Freq: Once | INTRAMUSCULAR | Status: DC
  Filled 2015-08-02: qty 1

## 2015-08-02 MED ORDER — ONDANSETRON HCL 4 MG/2ML IJ SOLN: 4.0000 mg | Freq: Four times a day (QID) | INTRAMUSCULAR | Status: DC | PRN

## 2015-08-02 MED ORDER — LIDOCAINE HCL (CARDIAC) 20 MG/ML IV SOLN
INTRAVENOUS | Status: DC | PRN
  Administered 2015-08-02: 50 mg via INTRAVENOUS

## 2015-08-02 MED ORDER — POLYETHYLENE GLYCOL 3350 17 G PO PACK
17.0000 g | PACK | Freq: Two times a day (BID) | ORAL | Status: DC
  Administered 2015-08-02 – 2015-08-04 (×4): 17 g via ORAL
  Filled 2015-08-02 (×4): qty 1

## 2015-08-02 MED ORDER — TRANEXAMIC ACID 1000 MG/10ML IV SOLN
1000.0000 mg | Freq: Once | INTRAVENOUS | Status: AC
  Administered 2015-08-02: 1000 mg via INTRAVENOUS
  Filled 2015-08-02: qty 10

## 2015-08-02 MED ORDER — ACETAMINOPHEN 325 MG PO TABS: 650.0000 mg | ORAL_TABLET | Freq: Four times a day (QID) | ORAL | Status: DC | PRN

## 2015-08-02 MED ORDER — DIPHENHYDRAMINE HCL 50 MG/ML IJ SOLN
INTRAMUSCULAR | Status: DC | PRN
Start: 2015-08-02 — End: 2015-08-02
  Administered 2015-08-02 (×2): 25 mg via INTRAVENOUS

## 2015-08-02 MED ORDER — PROPOFOL 500 MG/50ML IV EMUL
INTRAVENOUS | Status: DC | PRN
  Administered 2015-08-02: 75 ug/kg/min via INTRAVENOUS

## 2015-08-02 MED ORDER — HYDROMORPHONE HCL 1 MG/ML IJ SOLN
INTRAMUSCULAR | Status: AC
  Filled 2015-08-02: qty 1

## 2015-08-02 MED ORDER — MENTHOL 3 MG MT LOZG: 1.0000 | LOZENGE | OROMUCOSAL | Status: DC | PRN

## 2015-08-02 MED ORDER — PROPOFOL 10 MG/ML IV BOLUS
INTRAVENOUS | Status: AC
  Filled 2015-08-02: qty 40

## 2015-08-02 MED ORDER — VANCOMYCIN HCL IN DEXTROSE 1-5 GM/200ML-% IV SOLN: 1000.0000 mg | Freq: Two times a day (BID) | INTRAVENOUS | Status: DC

## 2015-08-02 MED ORDER — DEXAMETHASONE SODIUM PHOSPHATE 10 MG/ML IJ SOLN: 10.0000 mg | Freq: Once | INTRAMUSCULAR | Status: DC

## 2015-08-02 MED ORDER — METHOCARBAMOL 500 MG PO TABS
500.0000 mg | ORAL_TABLET | Freq: Four times a day (QID) | ORAL | Status: DC | PRN
  Administered 2015-08-03 – 2015-08-04 (×3): 500 mg via ORAL
  Filled 2015-08-02 (×4): qty 1

## 2015-08-02 MED ORDER — ASPIRIN EC 325 MG PO TBEC
325.0000 mg | DELAYED_RELEASE_TABLET | Freq: Two times a day (BID) | ORAL | Status: DC
  Administered 2015-08-03 – 2015-08-04 (×3): 325 mg via ORAL
  Filled 2015-08-02 (×3): qty 1

## 2015-08-02 MED ORDER — ONDANSETRON HCL 4 MG/2ML IJ SOLN
INTRAMUSCULAR | Status: DC | PRN
  Administered 2015-08-02: 4 mg via INTRAVENOUS

## 2015-08-02 MED ORDER — FERROUS SULFATE 325 (65 FE) MG PO TABS
325.0000 mg | ORAL_TABLET | Freq: Three times a day (TID) | ORAL | Status: DC
  Administered 2015-08-03 – 2015-08-04 (×2): 325 mg via ORAL
  Filled 2015-08-02 (×2): qty 1

## 2015-08-02 MED ORDER — METHOCARBAMOL 1000 MG/10ML IJ SOLN
500.0000 mg | Freq: Four times a day (QID) | INTRAVENOUS | Status: DC | PRN
  Administered 2015-08-02 – 2015-08-03 (×3): 500 mg via INTRAVENOUS
  Filled 2015-08-02: qty 5
  Filled 2015-08-02: qty 550
  Filled 2015-08-02 (×4): qty 5

## 2015-08-02 MED ORDER — LOSARTAN POTASSIUM 25 MG PO TABS
25.0000 mg | ORAL_TABLET | Freq: Every day | ORAL | Status: DC
  Filled 2015-08-02: qty 1

## 2015-08-02 MED ORDER — ALUM & MAG HYDROXIDE-SIMETH 200-200-20 MG/5ML PO SUSP: 30.0000 mL | ORAL | Status: DC | PRN

## 2015-08-02 MED ORDER — PHENYLEPHRINE HCL 10 MG/ML IJ SOLN
INTRAMUSCULAR | Status: DC | PRN
  Administered 2015-08-02 (×2): 40 ug via INTRAVENOUS
  Administered 2015-08-02 (×2): 80 ug via INTRAVENOUS

## 2015-08-02 MED ORDER — METOCLOPRAMIDE HCL 5 MG/ML IJ SOLN: 5.0000 mg | Freq: Three times a day (TID) | INTRAMUSCULAR | Status: DC | PRN

## 2015-08-02 MED ORDER — HYDROCODONE-ACETAMINOPHEN 5-325 MG PO TABS
1.0000 | ORAL_TABLET | ORAL | Status: DC | PRN
  Administered 2015-08-02 – 2015-08-03 (×4): 1 via ORAL
  Administered 2015-08-03 – 2015-08-04 (×2): 2 via ORAL
  Filled 2015-08-02: qty 2
  Filled 2015-08-02: qty 1
  Filled 2015-08-02: qty 2
  Filled 2015-08-02: qty 1
  Filled 2015-08-02 (×2): qty 2

## 2015-08-02 MED ORDER — BISACODYL 10 MG RE SUPP: 10.0000 mg | Freq: Every day | RECTAL | Status: DC | PRN

## 2015-08-02 MED ORDER — PHENOL 1.4 % MT LIQD: 1.0000 | OROMUCOSAL | Status: DC | PRN

## 2015-08-02 MED ORDER — DOCUSATE SODIUM 100 MG PO CAPS
100.0000 mg | ORAL_CAPSULE | Freq: Two times a day (BID) | ORAL | Status: DC
  Administered 2015-08-02 – 2015-08-04 (×4): 100 mg via ORAL
  Filled 2015-08-02 (×4): qty 1

## 2015-08-02 MED ORDER — ONDANSETRON HCL 4 MG/2ML IJ SOLN: 4.0000 mg | Freq: Once | INTRAMUSCULAR | Status: DC | PRN

## 2015-08-02 MED ORDER — DIPHENHYDRAMINE HCL 25 MG PO CAPS: 25.0000 mg | ORAL_CAPSULE | Freq: Four times a day (QID) | ORAL | Status: DC | PRN

## 2015-08-02 MED ORDER — TRAMADOL HCL 50 MG PO TABS
50.0000 mg | ORAL_TABLET | Freq: Four times a day (QID) | ORAL | Status: DC
Start: 2015-08-02 — End: 2015-08-04
  Administered 2015-08-02 – 2015-08-04 (×5): 100 mg via ORAL
  Filled 2015-08-02 (×5): qty 2

## 2015-08-02 MED ORDER — VANCOMYCIN HCL IN DEXTROSE 1-5 GM/200ML-% IV SOLN
1000.0000 mg | INTRAVENOUS | Status: AC
  Administered 2015-08-02: 1000 mg via INTRAVENOUS
  Filled 2015-08-02: qty 200

## 2015-08-02 MED ORDER — FLUTICASONE FUROATE-VILANTEROL 100-25 MCG/INH IN AEPB: 1.0000 | INHALATION_SPRAY | Freq: Every day | RESPIRATORY_TRACT | Status: DC | PRN

## 2015-08-02 MED ORDER — LACTATED RINGERS IV SOLN
INTRAVENOUS | Status: DC
  Administered 2015-08-02 (×2): via INTRAVENOUS

## 2015-08-02 MED ORDER — ACETAMINOPHEN 650 MG RE SUPP: 650.0000 mg | Freq: Four times a day (QID) | RECTAL | Status: DC | PRN

## 2015-08-02 MED ORDER — LOSARTAN POTASSIUM 25 MG PO TABS: 12.5000 mg | ORAL_TABLET | Freq: Every day | ORAL | Status: DC

## 2015-08-02 MED ORDER — CLINDAMYCIN PHOSPHATE 600 MG/50ML IV SOLN
600.0000 mg | Freq: Four times a day (QID) | INTRAVENOUS | Status: AC
  Administered 2015-08-02 – 2015-08-03 (×2): 600 mg via INTRAVENOUS
  Filled 2015-08-02 (×2): qty 50

## 2015-08-02 MED ORDER — MAGNESIUM CITRATE PO SOLN: 1.0000 | Freq: Once | ORAL | Status: DC | PRN

## 2015-08-02 SURGICAL SUPPLY — 31 items
BAG SPEC THK2 15X12 ZIP CLS (MISCELLANEOUS) ×1
BAG ZIPLOCK 12X15 (MISCELLANEOUS) ×1 IMPLANT
CAPT HIP TOTAL 2 ×1 IMPLANT
CLOTH BEACON ORANGE TIMEOUT ST (SAFETY) ×2 IMPLANT
COVER PERINEAL POST (MISCELLANEOUS) ×2 IMPLANT
DRAPE STERI IOBAN 125X83 (DRAPES) ×2 IMPLANT
DRAPE U-SHAPE 47X51 STRL (DRAPES) ×4 IMPLANT
DRESSING AQUACEL AG SP 3.5X10 (GAUZE/BANDAGES/DRESSINGS) ×1 IMPLANT
DRSG AQUACEL AG SP 3.5X10 (GAUZE/BANDAGES/DRESSINGS) ×2
DURAPREP 26ML APPLICATOR (WOUND CARE) ×2 IMPLANT
ELECT REM PT RETURN 9FT ADLT (ELECTROSURGICAL) ×2
ELECTRODE REM PT RTRN 9FT ADLT (ELECTROSURGICAL) ×1 IMPLANT
GLOVE BIOGEL M STRL SZ7.5 (GLOVE) ×2 IMPLANT
GLOVE BIOGEL PI IND STRL 7.5 (GLOVE) ×6 IMPLANT
GLOVE BIOGEL PI INDICATOR 7.5 (GLOVE) ×6
GLOVE ORTHO TXT STRL SZ7.5 (GLOVE) ×3 IMPLANT
GLOVE SURG SS PI 7.5 STRL IVOR (GLOVE) ×4 IMPLANT
GOWN STRL REUS W/ TWL LRG LVL3 (GOWN DISPOSABLE) IMPLANT
GOWN STRL REUS W/TWL LRG LVL3 (GOWN DISPOSABLE) ×4 IMPLANT
GOWN STRL REUS W/TWL XL LVL3 (GOWN DISPOSABLE) ×2 IMPLANT
HOLDER FOLEY CATH W/STRAP (MISCELLANEOUS) ×2 IMPLANT
LIQUID BAND (GAUZE/BANDAGES/DRESSINGS) ×2 IMPLANT
PACK ANTERIOR HIP CUSTOM (KITS) ×2 IMPLANT
SAW OSC TIP CART 19.5X105X1.3 (SAW) ×2 IMPLANT
SUT MNCRL AB 4-0 PS2 18 (SUTURE) ×2 IMPLANT
SUT VIC AB 1 CT1 36 (SUTURE) ×6 IMPLANT
SUT VIC AB 2-0 CT1 27 (SUTURE) ×4
SUT VIC AB 2-0 CT1 TAPERPNT 27 (SUTURE) ×2 IMPLANT
SUT VLOC 180 0 24IN GS25 (SUTURE) ×2 IMPLANT
TRAY FOLEY W/METER SILVER 14FR (SET/KITS/TRAYS/PACK) ×1 IMPLANT
YANKAUER SUCT BULB TIP 10FT TU (MISCELLANEOUS) ×2 IMPLANT

## 2015-08-02 NOTE — Progress Notes (Signed)
Utilization review completed.  

## 2015-08-02 NOTE — Interval H&P Note (Signed)
History and Physical Interval Note:  08/02/2015 12:22 PM  Madison [name]  has presented today for surgery, with the diagnosis of RIGHT HIP OA  The various methods of treatment have been discussed with the patient and family. After consideration of risks, benefits and other options for treatment, the patient has consented to  Procedure(s): RIGHT TOTAL HIP ARTHROPLASTY ANTERIOR APPROACH (Right) as a surgical intervention .  The patient's history has been reviewed, patient examined, no change in status, stable for surgery.  I have reviewed the patient's chart and labs.  Questions were answered to the patient's satisfaction.     Mauri Pole

## 2015-08-02 NOTE — Anesthesia Postprocedure Evaluation (Signed)
Anesthesia Post Note  Patient: Madison [name]  Procedure(s) Performed: Procedure(s) (LRB): RIGHT TOTAL HIP ARTHROPLASTY ANTERIOR APPROACH (Right)  Patient location during evaluation: PACU Anesthesia Type: Spinal Level of consciousness: awake, awake and alert, oriented and patient cooperative Pain management: pain level controlled Vital Signs Assessment: post-procedure vital signs reviewed and stable Respiratory status: spontaneous breathing and respiratory function stable Cardiovascular status: stable Anesthetic complications: no    Last Vitals:  Filed Vitals:   08/02/15 1545 08/02/15 1600  BP: 116/85 125/73  Pulse: 60 60  Temp:    Resp: 11 10    Last Pain:  Filed Vitals:   08/02/15 1611  PainSc: 4                  Lacey Dotson EDWARD

## 2015-08-02 NOTE — Transfer of Care (Signed)
Immediate Anesthesia Transfer of Care Note  Patient: Madison [name]  Procedure(s) Performed: Procedure(s): RIGHT TOTAL HIP ARTHROPLASTY ANTERIOR APPROACH (Right)  Patient Location: PACU  Anesthesia Type:Spinal  Level of Consciousness: sedated  Airway & Oxygen Therapy: Patient Spontanous Breathing and Patient connected to face mask oxygen  Post-op Assessment: Report given to RN and Post -op Vital signs reviewed and stable  Post vital signs: Reviewed and stable  Last Vitals:  Filed Vitals:   08/02/15 1114  BP: 140/88  Pulse: 65  Temp: 36.6 C  Resp: 16    Last Pain: There were no vitals filed for this visit.    Patients Stated Pain Goal: 4 ([id number])  Complications: No apparent anesthesia complications

## 2015-08-02 NOTE — Anesthesia Preprocedure Evaluation (Signed)
Anesthesia Evaluation  Patient identified by MRN, date of birth, ID band  Airway Mallampati: I  TM Distance: >3 FB Neck ROM: Full    Dental   Pulmonary shortness of breath, asthma ,    Pulmonary exam normal        Cardiovascular hypertension, + angina + CAD, + Past MI and + Peripheral Vascular Disease  Normal cardiovascular exam+ dysrhythmias + pacemaker      Neuro/Psych  Headaches,    GI/Hepatic GERD  ,  Endo/Other    Renal/GU      Musculoskeletal  (+) Arthritis ,   Abdominal   Peds  Hematology   Anesthesia Other Findings   Reproductive/Obstetrics                             Anesthesia Physical Anesthesia Plan  ASA: III  Anesthesia Plan: Spinal   Post-op Pain Management:    Induction: Intravenous  Airway Management Planned: Mask  Additional Equipment:   Intra-op Plan:   Post-operative Plan:   Informed Consent: I have reviewed the patients History and Physical, chart, labs and discussed the procedure including the risks, benefits and alternatives for the proposed anesthesia with the patient or authorized representative who has indicated his/her understanding and acceptance.     Plan Discussed with: CRNA, Anesthesiologist and Surgeon  Anesthesia Plan Comments:         Anesthesia Quick Evaluation

## 2015-08-02 NOTE — Discharge Instructions (Signed)

## 2015-08-02 NOTE — Anesthesia Procedure Notes (Signed)
Spinal Patient location during procedure: OR Start time: 08/02/2015 1:42 PM End time: 08/02/2015 1:45 PM Staffing Anesthesiologist: Kate Sable Performed by: anesthesiologist  Preanesthetic Checklist Completed: patient identified, site marked, surgical consent, pre-op evaluation, timeout performed, IV checked, risks and benefits discussed and monitors and equipment checked Spinal Block Patient position: right lateral decubitus Prep: Betadine Patient monitoring: heart rate, cardiac monitor, continuous pulse ox and blood pressure Approach: midline Location: L3-4 Injection technique: single-shot Needle Needle type: Sprotte  Needle gauge: 22 G Needle length: 9 cm Needle insertion depth: 5 cm Additional Notes Pt accepts procedure w/ risks. 22ga clear CSF free flow w/o blood. 12mg  Marcaine w/ epi. W/o difficulty. GES

## 2015-08-02 NOTE — Op Note (Signed)
NAME:  Madison [name] NO.: [id number]      MEDICAL RECORD NO.: [id number]      FACILITY:  [facility]      PHYSICIAN:  Paralee Cancel D  DATE OF BIRTH:  Dec 27, 1933     DATE OF PROCEDURE:  08/02/2015                                 OPERATIVE REPORT         PREOPERATIVE DIAGNOSIS: Right  hip osteoarthritis.      POSTOPERATIVE DIAGNOSIS:  Right hip osteoarthritis.      PROCEDURE:  Right total hip replacement through an anterior approach   utilizing DePuy THR system, component size 49mm pinnacle cup, a size 36+4 neutral   Altrex liner, a size 6 Hi Tri Lock stem with a 36+1.5 delta ceramic   ball.      SURGEON:  Pietro Cassis. Alvan Dame, M.D.      ASSISTANT:  Nehemiah Massed, PA-C     ANESTHESIA:  Spinal.      SPECIMENS:  None.      COMPLICATIONS:  None.      BLOOD LOSS:  300 cc     DRAINS:  None.      INDICATION OF THE PROCEDURE:  Madison BARTOLOMEI is a 82 y.o. female who had   presented to office for evaluation of right hip pain.  Radiographs revealed   progressive degenerative changes with bone-on-bone   articulation to the  hip joint.  The patient had painful limited range of   motion significantly affecting their overall quality of life.  The patient was failing to    respond to conservative measures, and at this point was ready   to proceed with more definitive measures.  The patient has noted progressive   degenerative changes in his hip, progressive problems and dysfunction   with regarding the hip prior to surgery.  Consent was obtained for   benefit of pain relief.  Specific risk of infection, DVT, component   failure, dislocation, need for revision surgery, as well discussion of   the anterior versus posterior approach were reviewed.  Consent was   obtained for benefit of anterior pain relief through an anterior   approach.      PROCEDURE IN DETAIL:  The patient was brought to operative theater.   Once adequate anesthesia, preoperative  antibiotics, 1gm of Vancomycin, 1 gm of Tranexamic Acid, and 10 mg of Decadron administered.   The patient was positioned supine on the OSI Hanna table.  Once adequate   padding of boney process was carried out, we had predraped out the hip, and  used fluoroscopy to confirm orientation of the pelvis and position.      The right hip was then prepped and draped from proximal iliac crest to   mid thigh with shower curtain technique.      Time-out was performed identifying the patient, planned procedure, and   extremity.     An incision was then made 2 cm distal and lateral to the   anterior superior iliac spine extending over the orientation of the   tensor fascia lata muscle and sharp dissection was carried down to the   fascia of the muscle and protractor placed in the soft tissues.      The fascia was  then incised.  The muscle belly was identified and swept   laterally and retractor placed along the superior neck.  Following   cauterization of the circumflex vessels and removing some pericapsular   fat, a second cobra retractor was placed on the inferior neck.  A third   retractor was placed on the anterior acetabulum after elevating the   anterior rectus.  A L-capsulotomy was along the line of the   superior neck to the trochanteric fossa, then extended proximally and   distally.  Tag sutures were placed and the retractors were then placed   intracapsular.  We then identified the trochanteric fossa and   orientation of my neck cut, confirmed this radiographically   and then made a neck osteotomy with the femur on traction.  The femoral   head was removed without difficulty or complication.  Traction was let   off and retractors were placed posterior and anterior around the   acetabulum.      The labrum and foveal tissue were debrided.  I began reaming with a 44mm   reamer and reamed up to 23mm reamer with good bony bed preparation and a 46mm   cup was chosen.  The final 43mm  Pinnacle cup was then impacted under fluoroscopy  to confirm the depth of penetration and orientation with respect to   abduction.  A screw was placed followed by the hole eliminator.  The final   36+4 neutral Altrex liner was impacted with good visualized rim fit.  The cup was positioned anatomically within the acetabular portion of the pelvis.      At this point, the femur was rolled at 80 degrees.  Further capsule was   released off the inferior aspect of the femoral neck.  I then   released the superior capsule proximally.  The hook was placed laterally   along the femur and elevated manually and held in position with the bed   hook.  The leg was then extended and adducted with the leg rolled to 100   degrees of external rotation.  Once the proximal femur was fully   exposed, I used a box osteotome to set orientation.  I then began   broaching with the starting chili pepper broach and passed this by hand and then broached up to 6.  With the 6 broach in place I chose a high offset neck and did trial reductions.  The offset was appropriate, leg lengths   appeared to be equal best matched with the +1.5 head ball confirmed radiographically.   Given these findings, I went ahead and dislocated the hip, repositioned all   retractors and positioned the right hip in the extended and abducted position.  The final 6 Hi Tri Lock stem was   chosen and it was impacted down to the level of neck cut.  Based on this   and the trial reduction, a 36+1.5 delta ceramic ball was chosen and   impacted onto a clean and dry trunnion, and the hip was reduced.  The   hip had been irrigated throughout the case again at this point.  I did   reapproximate the superior capsular leaflet to the anterior leaflet   using #1 Vicryl.  The fascia of the   tensor fascia lata muscle was then reapproximated using #1 Vicryl and #0 V-lock sutures.  The   remaining wound was closed with 2-0 Vicryl and running 4-0 Monocryl.   The  hip was cleaned, dried, and dressed  sterilely using Dermabond and   Aquacel dressing.  She was then brought   to recovery room in stable condition tolerating the procedure well.    Nehemiah Massed, PA-C was present for the entirety of the case involved from   preoperative positioning, perioperative retractor management, general   facilitation of the case, as well as primary wound closure as assistant.            Pietro Cassis Alvan Dame, M.D.        08/02/2015 3:04 PM

## 2015-08-03 ENCOUNTER — Encounter (HOSPITAL_COMMUNITY): Payer: Self-pay | Admitting: Orthopedic Surgery

## 2015-08-03 LAB — CBC
HCT: 30.7 % — ABNORMAL LOW (ref 36.0–46.0)
Hemoglobin: 10.6 g/dL — ABNORMAL LOW (ref 12.0–15.0)
MCH: 29.5 pg (ref 26.0–34.0)
MCHC: 34.5 g/dL (ref 30.0–36.0)
MCV: 85.5 fL (ref 78.0–100.0)
PLATELETS: 152 10*3/uL (ref 150–400)
RBC: 3.59 MIL/uL — AB (ref 3.87–5.11)
RDW: 12.3 % (ref 11.5–15.5)
WBC: 7.6 10*3/uL (ref 4.0–10.5)

## 2015-08-03 LAB — BASIC METABOLIC PANEL
Anion gap: 7 (ref 5–15)
BUN: 15 mg/dL (ref 6–20)
CO2: 21 mmol/L — ABNORMAL LOW (ref 22–32)
CREATININE: 0.86 mg/dL (ref 0.44–1.00)
Calcium: 7.9 mg/dL — ABNORMAL LOW (ref 8.9–10.3)
Chloride: 109 mmol/L (ref 101–111)
GFR calc Af Amer: >60 mL/min (ref >60)
GLUCOSE: 183 mg/dL — AB (ref 65–99)
Potassium: 4.1 mmol/L (ref 3.5–5.1)
SODIUM: 137 mmol/L (ref 135–145)

## 2015-08-03 MED ORDER — POLYETHYLENE GLYCOL 3350 17 G PO PACK: 17.0000 g | PACK | Freq: Two times a day (BID) | ORAL | Status: DC

## 2015-08-03 MED ORDER — HYDROCODONE-ACETAMINOPHEN 5-325 MG PO TABS
1.0000 | ORAL_TABLET | ORAL | Status: DC | PRN
Start: 2015-08-03 — End: 2015-08-04

## 2015-08-03 MED ORDER — TIZANIDINE HCL 4 MG PO TABS: 4.0000 mg | ORAL_TABLET | Freq: Four times a day (QID) | ORAL | Status: DC | PRN

## 2015-08-03 MED ORDER — FERROUS SULFATE 325 (65 FE) MG PO TABS: 325.0000 mg | ORAL_TABLET | Freq: Three times a day (TID) | ORAL | Status: DC

## 2015-08-03 MED ORDER — DOCUSATE SODIUM 100 MG PO CAPS: 100.0000 mg | ORAL_CAPSULE | Freq: Two times a day (BID) | ORAL | Status: DC

## 2015-08-03 MED ORDER — ASPIRIN 325 MG PO TBEC: 325.0000 mg | DELAYED_RELEASE_TABLET | Freq: Two times a day (BID) | ORAL | Status: DC

## 2015-08-03 NOTE — Progress Notes (Addendum)
Physical Therapy Treatment Patient Details Name: Madison [name] MRN: [id number] DOB: 17-Jun-1933 Today's Date: 08/03/2015    History of Present Illness R THA direct anterior    PT Comments    Patient progressing very well. Plans DC tomorrow.  Follow Up Recommendations  Home health PT;Supervision/Assistance - 24 hour     Equipment Recommendations  None recommended by PT    Recommendations for Other Services       Precautions / Restrictions      Mobility  Bed Mobility Overal bed mobility: Modified Independent             General bed mobility comments: cues for technique and for  R leg support with L leg  Transfers   Equipment used: Rolling walker (2 wheeled)   Sit to Stand: Supervision         General transfer comment: improved balance. Cues  to not walk away from the RW  Ambulation/Gait Ambulation/Gait assistance: Supervision Ambulation Distance (Feet): 200 Feet   Gait Pattern/deviations: Step-through pattern     General Gait Details: smoothe gait  this visit with step through. cues to not walk away from the RW, to not reach over for things. lost balance x 1 doing this.   Stairs            Wheelchair Mobility    Modified Rankin (Stroke Patients Only)       Balance                                    Cognition Arousal/Alertness: Awake/alert                          Exercises      General Comments        Pertinent Vitals/Pain Pain Score: 2  Pain Location: R hip Pain Intervention(s): Monitored during session;Repositioned    Home Living                      Prior Function            PT Goals (current goals can now be found in the care plan section) Progress towards PT goals: Progressing toward goals    Frequency  7X/week    PT Plan Current plan remains appropriate    Co-evaluation             End of Session Equipment Utilized During Treatment: Gait belt Activity Tolerance:  Patient tolerated treatment well Patient left: in bed;with call bell/phone within reach;with bed alarm set;with nursing/sitter in room     Time: [id number] PT Time Calculation (min) (ACUTE ONLY): 23 min  Charges:  $Gait Training: 23-37 mins                    G Codes:      Claretha Cooper 08/03/2015, 4:43 PM

## 2015-08-03 NOTE — Care Management Note (Signed)
Case Management Note  Patient Details  Name: LAKIETHA WIGFIELD MRN: [id number] Date of Birth: [birth year]  Subjective/Objective:  82 y/o f admitted w/R THA. From home. [facility] rep Tim aware.HHPT @ d/c.                  Action/Plan:d/c home w/HHC   Expected Discharge Date:                  Expected Discharge Plan:  [place]  In-House Referral:     Discharge planning Services  CM Consult  Post Acute Care Choice:    Choice offered to:  Patient  DME Arranged:    DME Agency:     HH Arranged:    HH Agency:  [place]  Status of Service:  In process, will continue to follow  Medicare Important Message Given:    Date Medicare IM Given:    Medicare IM give by:    Date Additional Medicare IM Given:    Additional Medicare Important Message give by:     If discussed at [place] of Stay Meetings, dates discussed:    Additional Comments:  Dessa Phi, RN 08/03/2015, 2:22 PM

## 2015-08-03 NOTE — Progress Notes (Signed)
     Subjective: 1 Day Post-Op Procedure(s) (LRB): RIGHT TOTAL HIP ARTHROPLASTY ANTERIOR APPROACH (Right)   Patient reports pain as mild, pain controlled. No events throughout the night.    Objective:   VITALS:   Filed Vitals:   08/03/15 0221 08/03/15 0614  BP: 99/57 97/62  Pulse: 64 60  Temp: 97.3 F (36.3 C) 97.8 F (36.6 C)  Resp: 15 16    Dorsiflexion/Plantar flexion intact Incision: dressing C/D/I No cellulitis present Compartment soft  LABS  Recent Labs  08/03/15 0409  HGB 10.6*  HCT 30.7*  WBC 7.6  PLT 152     Recent Labs  08/03/15 0409  NA 137  K 4.1  BUN 15  CREATININE 0.86  GLUCOSE 183*     Assessment/Plan: 1 Day Post-Op Procedure(s) (LRB): RIGHT TOTAL HIP ARTHROPLASTY ANTERIOR APPROACH (Right) Foley cath d/c'ed Advance diet Up with therapy D/C IV fluids  Plan on d/c home eventually, when ready  Overweight (BMI 25-29.9)  Estimated body mass index is 29.85 kg/(m^2) as calculated from the following:   Height as of this encounter: 5\' 4"  (1.626 m).   Weight as of this encounter: 78.926 kg (174 lb). Patient also counseled that weight may inhibit the healing process Patient counseled that losing weight will help with future health issues          [place] Pugh. Emrie Gayle   PAC  08/03/2015, 8:46 AM

## 2015-08-03 NOTE — Evaluation (Signed)
Occupational Therapy Evaluation Patient Details Name: Madison [name] MRN: [id number] DOB: 04-25-1933 Today's Date: 08/03/2015    History of Present Illness LTKA   Clinical Impression   Pt is s/p THA resulting in the deficits listed below (see OT Problem List).  Pt will benefit from skilled OT to increase their safety and independence with ADL and functional mobility for ADL to facilitate discharge to venue listed below.        Follow Up Recommendations  Home health OT    Equipment Recommendations  None recommended by OT       Precautions / Restrictions Precautions Precautions: Fall Restrictions Weight Bearing Restrictions: No      Mobility Bed Mobility Overal bed mobility: Needs Assistance Bed Mobility: Sit to Supine       Sit to supine: Mod assist      Transfers Overall transfer level: Needs assistance Equipment used: Rolling walker (2 wheeled) Transfers: Sit to/from [facility] Sit to Stand: Min assist Stand pivot transfers: Min assist                 ADL Overall ADL's : Needs assistance/impaired Eating/Feeding: Set up;Sitting   Grooming: Set up;Sitting   Upper Body Bathing: Set up;Sitting   Lower Body Bathing: Moderate assistance;Sit to/from stand;Cueing for sequencing;Cueing for safety   Upper Body Dressing : Set up;Sitting   Lower Body Dressing: Moderate assistance;Sit to/from stand;Cueing for sequencing;Cueing for safety                 General ADL Comments: pt fatigued and wanted to get back to bed               Pertinent Vitals/Pain Pain Assessment: 0-10 Pain Score: 4  Pain Location: R hip Pain Descriptors / Indicators: Tender;Discomfort;Heaviness Pain Intervention(s): Monitored during session;Limited activity within patient's tolerance;Premedicated before session;Repositioned     Hand Dominance     Extremity/Trunk Assessment Upper Extremity Assessment Upper Extremity Assessment: Overall WFL for tasks  assessed   Lower Extremity Assessment Lower Extremity Assessment: RLE deficits/detail RLE Deficits / Details: able to advance the leg   Cervical / Trunk Assessment Cervical / Trunk Assessment: Normal   Communication Communication Communication: No difficulties   Cognition Arousal/Alertness: Awake/alert Behavior During Therapy: WFL for tasks assessed/performed Overall Cognitive Status: Within Functional Limits for tasks assessed                                Home Living Family/patient expects to be discharged to:: Private residence Living Arrangements: Spouse/significant other Available Help at Discharge: Family Type of Home: House Home Access: Stairs to enter Technical brewer of Steps: 1 Entrance Stairs-Rails: None Home Layout: Multi-level Alternate Level Stairs-Number of Steps: 10 to sun room Alternate Level Stairs-Rails: Right Bathroom Shower/Tub: Tub/shower unit;Walk-in shower         Home Equipment: Gilford Rile - 2 wheels;Bedside commode          Prior Functioning/Environment Level of Independence: Independent             OT Diagnosis: Generalized weakness;Acute pain   OT Problem List: Decreased strength;Decreased activity tolerance   OT Treatment/Interventions: Self-care/ADL training;DME and/or AE instruction;Patient/family education    OT Goals(Current goals can be found in the care plan section) Acute Rehab OT Goals Patient Stated Goal: home tomorrow OT Goal Formulation: With patient Time For Goal Achievement: 08/17/15 Potential to Achieve Goals: Good  OT Frequency: Min 2X/week  End of Session Equipment Utilized During Treatment: Rolling walker;Gait belt Nurse Communication: Mobility status  Activity Tolerance: Patient tolerated treatment well Patient left: in bed;with call bell/phone within reach;with family/visitor present   Time: [id number] OT Time Calculation (min): 13 min Charges:  OT General Charges $OT  Visit: 1 Procedure OT Evaluation $OT Eval Low Complexity: 1 Procedure G-Codes:    Betsy Pries 08-21-2015, 11:58 AM

## 2015-08-03 NOTE — Evaluation (Addendum)
Physical Therapy Evaluation Patient Details Name: Madison [name] MRN: [id number] DOB: 02-02-1934 Today's Date: 08/03/2015   History of Present Illness  R DATHA  Clinical Impression  The patient is mildly dizzy upon sitting and standing. BP 99/48 sitting, after ambulation 108/55. The patient did ambulate without further dizziness. The patient will benefit from PT while in acute care to address the problems listed in the note below to DC to home tomorrow.    Follow Up Recommendations Home health PT;Supervision/Assistance - 24 hour    Equipment Recommendations  None recommended by PT    Recommendations for Other Services       Precautions / Restrictions Precautions Precautions: Fall Restrictions Weight Bearing Restrictions: No      Mobility  Bed Mobility Overal bed mobility: Needs Assistance Bed Mobility: Sit to Supine     Supine to sit: Min assist Sit to supine: Mod assist   General bed mobility comments: cues for technique and for  R leg support  Transfers Overall transfer level: Needs assistance Equipment used: Rolling walker (2 wheeled) Transfers: Sit to/from [facility] Sit to Stand: Min assist Stand pivot transfers: Min assist       General transfer comment: patient was unsteady upon standing, steady assist tp gain balance. Cues for safety and hand and right  leg position  Ambulation/Gait Ambulation/Gait assistance: Min assist Ambulation Distance (Feet): 120 Feet Assistive device: Rolling walker (2 wheeled) Gait Pattern/deviations: Step-to pattern;Decreased stance time - right;Decreased stride length     General Gait Details: multimodal cues for safety, sequence, for position inside the RW. Noted decreased balance with steady assistance for balance  Stairs            Wheelchair Mobility    Modified Rankin (Stroke Patients Only)       Balance Overall balance assessment: Needs assistance Sitting-balance support: Feet  supported;No upper extremity supported Sitting balance-Leahy Scale: Good     Standing balance support: During functional activity;Bilateral upper extremity supported Standing balance-Leahy Scale: Fair                               Pertinent Vitals/Pain Pain Assessment: 0-10 Pain Score: 4  Pain Location: R hip Pain Descriptors / Indicators: Tender;Discomfort;Heaviness Pain Intervention(s): Monitored during session;Limited activity within patient's tolerance;Premedicated before session;Repositioned    Home Living Family/patient expects to be discharged to:: Private residence Living Arrangements: Spouse/significant other Available Help at Discharge: Family Type of Home: House Home Access: Stairs to enter Entrance Stairs-Rails: None Entrance Stairs-Number of Steps: 1 Home Layout: Multi-level Home Equipment: Environmental consultant - 2 wheels;Bedside commode      Prior Function Level of Independence: Independent               Hand Dominance        Extremity/Trunk Assessment   Upper Extremity Assessment: Overall WFL for tasks assessed           Lower Extremity Assessment: RLE deficits/detail RLE Deficits / Details: able to advance the leg    Cervical / Trunk Assessment: Normal  Communication   Communication: No difficulties  Cognition Arousal/Alertness: Awake/alert Behavior During Therapy: WFL for tasks assessed/performed Overall Cognitive Status: Within Functional Limits for tasks assessed                      General Comments      Exercises        Assessment/Plan    PT Assessment Patient needs  continued PT services  PT Diagnosis Difficulty walking   PT Problem List Decreased strength;Decreased activity tolerance;Decreased mobility;Pain  PT Treatment Interventions DME instruction;Gait training;Stair training;Functional mobility training;Therapeutic activities;Therapeutic exercise;Patient/family education   PT Goals (Current goals can be found  in the Care Plan section) Acute Rehab PT Goals Patient Stated Goal: home tomorrow PT Goal Formulation: With patient/family Time For Goal Achievement: 08/05/15 Potential to Achieve Goals: Good    Frequency 7X/week   Barriers to discharge        Co-evaluation               End of Session Equipment Utilized During Treatment: Gait belt Activity Tolerance: Patient tolerated treatment well Patient left: in chair;with call bell/phone within reach;with chair alarm set Nurse Communication: Mobility status         Time: 1015-1100 PT Time Calculation (min) (ACUTE ONLY): 45 min   Charges:   PT Evaluation $PT Eval Low Complexity: 1 Procedure PT Treatments $Gait Training: 8-22 mins $Therapeutic Exercise: 8-22 mins   PT G Codes:        Claretha Cooper 08/03/2015, 12:12 PM Tresa Endo PT [phone]

## 2015-08-04 LAB — CBC
HEMATOCRIT: 28.7 % — AB (ref 36.0–46.0)
Hemoglobin: 9.7 g/dL — ABNORMAL LOW (ref 12.0–15.0)
MCH: 29.2 pg (ref 26.0–34.0)
MCHC: 33.8 g/dL (ref 30.0–36.0)
MCV: 86.4 fL (ref 78.0–100.0)
PLATELETS: 138 10*3/uL — AB (ref 150–400)
RBC: 3.32 MIL/uL — ABNORMAL LOW (ref 3.87–5.11)
RDW: 12.5 % (ref 11.5–15.5)
WBC: 6.7 10*3/uL (ref 4.0–10.5)

## 2015-08-04 LAB — BASIC METABOLIC PANEL
Anion gap: 7 (ref 5–15)
BUN: 16 mg/dL (ref 6–20)
CO2: 27 mmol/L (ref 22–32)
CREATININE: 0.97 mg/dL (ref 0.44–1.00)
Calcium: 8.2 mg/dL — ABNORMAL LOW (ref 8.9–10.3)
Chloride: 107 mmol/L (ref 101–111)
GFR calc Af Amer: >60 mL/min (ref >60)
GFR, EST NON AFRICAN AMERICAN: 54 mL/min — AB (ref >60)
Glucose, Bld: 108 mg/dL — ABNORMAL HIGH (ref 65–99)
Potassium: 4.1 mmol/L (ref 3.5–5.1)
SODIUM: 141 mmol/L (ref 135–145)

## 2015-08-04 MED ORDER — TIZANIDINE HCL 4 MG PO TABS: 4.0000 mg | ORAL_TABLET | Freq: Four times a day (QID) | ORAL | Status: DC | PRN

## 2015-08-04 MED ORDER — HYDROCODONE-ACETAMINOPHEN 5-325 MG PO TABS: 1.0000 | ORAL_TABLET | ORAL | Status: DC | PRN

## 2015-08-04 MED ORDER — TRAMADOL HCL 50 MG PO TABS: 50.0000 mg | ORAL_TABLET | Freq: Four times a day (QID) | ORAL | Status: DC | PRN

## 2015-08-04 MED ORDER — ASPIRIN 325 MG PO TBEC: 325.0000 mg | DELAYED_RELEASE_TABLET | Freq: Two times a day (BID) | ORAL | Status: AC

## 2015-08-04 NOTE — Progress Notes (Signed)
Physical Therapy Treatment Patient Details Name: Madison [name] MRN: [id number] DOB: 20-Jul-1933 Today's Date: 08/04/2015    History of Present Illness R THA    PT Comments    Ready for DC.  Follow Up Recommendations  Home health PT;Supervision/Assistance - 24 hour     Equipment Recommendations       Recommendations for Other Services       Precautions / Restrictions Precautions Precautions: Fall    Mobility  Bed Mobility Overal bed mobility: Modified Independent Bed Mobility: Supine to Sit;Sit to Supine              Transfers   Equipment used: Rolling walker (2 wheeled) Transfers: Sit to/from Stand Sit to Stand: Supervision            Ambulation/Gait Ambulation/Gait assistance: Supervision Ambulation Distance (Feet): 300 Feet Assistive device: Rolling walker (2 wheeled) Gait Pattern/deviations: Step-through pattern     General Gait Details: smoothe gait  this visit with step through. cues to not walk away from the RW,    Stairs Stairs: Yes Stairs assistance: Min guard Stair Management: Step to pattern;Sideways;Forwards;With cane;One rail Right Number of Stairs: 4 General stair comments: practiced with cane and rail and then with R rail and sideways. then ! step up forward.  Wheelchair Mobility    Modified Rankin (Stroke Patients Only)       Balance                                    Cognition                            Exercises Total Joint Exercises Quad Sets: AROM;Right;10 reps Short Arc Quad: AROM;Right;10 reps Heel Slides: AROM;Right;10 reps Hip ABduction/ADduction: AROM;Right;10 reps Long Arc Quad: AROM;Right;10 reps    General Comments        Pertinent Vitals/Pain Pain Score: 2     Home Living                      Prior Function            PT Goals (current goals can now be found in the care plan section) Progress towards PT goals: Progressing toward goals    Frequency        PT Plan Current plan remains appropriate    Co-evaluation             End of Session   Activity Tolerance: Patient tolerated treatment well Patient left: in chair;with call bell/phone within reach     Time: 0903-0940 PT Time Calculation (min) (ACUTE ONLY): 37 min  Charges:  $Gait Training: 8-22 mins $Therapeutic Exercise: 8-22 mins $Self Care/Home Management: 04-Dec-2022                    G Codes:      Claretha Cooper 08/04/2015, 5:27 PM

## 2015-08-09 NOTE — Discharge Summary (Signed)
Physician Discharge Summary  Patient ID: Madison [name] MRN: [id number] DOB/AGE: 1934-01-25 82 y.o.  Admit date: 08/02/2015 Discharge date: 08/04/2015   Procedures:  Procedure(s) (LRB): RIGHT TOTAL HIP ARTHROPLASTY ANTERIOR APPROACH (Right)  Attending Physician:  Dr. Paralee Cancel   Admission Diagnoses:   Right hip primary OA / pain  Discharge Diagnoses:  Principal Problem:   S/P right THA, AA  Past Medical History  Diagnosis Date  . ALLERGIC RHINITIS   . Asthma   . Cough   . Dysfunction of eustachian tube   . Acquired absence of breast and nipple   . Esophageal reflux   . Coronary atherosclerosis   . PVC's (premature ventricular contractions)   . Hypertension   . Hyperlipidemia   . Breast cancer ([place])     left mastectomy  . Syncope 11/17/2013  . Complication of anesthesia   . PONV (postoperative nausea and vomiting)   . Anginal pain ([place])   . Heart murmur   . Peripheral vascular disease ([place])   . Shortness of breath   . Arthritis   . Presence of permanent cardiac pacemaker   . Acute myocardial infarction, unspecified site, episode of care unspecified     1965 and 2015   . Pneumonia     hx of walking pneumonia x 2   . Stress incontinence   . Hemorrhoids   . Blindness of left eye     decreased vision in left eye related to ocular occlusion     HPI:    Madison [name], 82 y.o. female, has a history of pain and functional disability in the right hip(s) due to arthritis and patient has failed non-surgical conservative treatments for greater than 12 weeks to include NSAID's and/or analgesics, use of assistive devices and activity modification. Onset of symptoms was gradual starting >10 years ago with gradually worsening course since that time.The patient noted no past surgery on the right hip(s). Patient currently rates pain in the right hip at 7 out of 10 with activity. Patient has night pain, worsening of pain with activity and weight bearing, trendelenberg gait, pain  that interfers with activities of daily living and pain with passive range of motion. Patient has evidence of periarticular osteophytes and joint space narrowing by imaging studies. This condition presents safety issues increasing the risk of falls. There is no current active infection. Risks, benefits and expectations were discussed with the patient. Risks including but not limited to the risk of anesthesia, blood clots, nerve damage, blood vessel damage, failure of the prosthesis, infection and up to and including death. Patient understand the risks, benefits and expectations and wishes to proceed with surgery.   PCP: Jerlyn Ly, MD   Discharged Condition: good  Hospital Course:  Patient underwent the above stated procedure on 08/02/2015. Patient tolerated the procedure well and brought to the recovery room in good condition and subsequently to the floor.  POD #1 BP: 97/62 ; Pulse: 60 ; Temp: 97.8 F (36.6 C) ; Resp: 16 Patient reports pain as mild, pain controlled. No events throughout the night.  Dorsiflexion/plantar flexion intact, incision: dressing C/D/I, no cellulitis present and compartment soft.   LABS  Basename    HGB     10.6  HCT     30.7   POD #2  BP: 120/66 ; Pulse: 65 ; Temp: 98.2 F (36.8 C) ; Resp: 18 Patient reports pain as mild, pain controlled. No events throughout the night.  Ready to be discharged home. Dorsiflexion/plantar flexion  intact, incision: dressing C/D/I, no cellulitis present and compartment soft.   LABS  Basename    HGB     9.7  HCT     28.7    Discharge Exam: General appearance: alert, cooperative and no distress Extremities: Homans sign is negative, no sign of DVT, no edema, redness or tenderness in the calves or thighs and no ulcers, gangrene or trophic changes  Disposition: Home with follow up in 2 weeks   Follow-up Information    Follow up with Mauri Pole, MD. Schedule an appointment as soon as possible for a visit in 2 weeks.     Specialty:  Orthopedic Surgery   Contact information:   [address] [place] [address] [id number]       Discharge Instructions    Call MD / Call 911    Complete by:  As directed   If you experience chest pain or shortness of breath, CALL 911 and be transported to the hospital emergency room.  If you develope a fever above 101 F, pus (white drainage) or increased drainage or redness at the wound, or calf pain, call your surgeon's office.     Change dressing    Complete by:  As directed   Maintain surgical dressing until follow up in the clinic. If the edges start to pull up, may reinforce with tape. If the dressing is no longer working, may remove and cover with gauze and tape, but must keep the area dry and clean.  Call with any questions or concerns.     Constipation Prevention    Complete by:  As directed   Drink plenty of fluids.  Prune juice may be helpful.  You may use a stool softener, such as Colace (over the counter) 100 mg twice a day.  Use MiraLax (over the counter) for constipation as needed.     Diet - low sodium heart healthy    Complete by:  As directed      Discharge instructions    Complete by:  As directed   Maintain surgical dressing until follow up in the clinic. If the edges start to pull up, may reinforce with tape. If the dressing is no longer working, may remove and cover with gauze and tape, but must keep the area dry and clean.  Follow up in 2 weeks at [facility]. Call with any questions or concerns.     Increase activity slowly as tolerated    Complete by:  As directed   Weight bearing as tolerated with assist device (walker, cane, etc) as directed, use it as long as suggested by your surgeon or therapist, typically at least 4-6 weeks.     TED hose    Complete by:  As directed   Use stockings (TED hose) for 2 weeks on both leg(s).  You may remove them at night for sleeping.             Medication List    STOP  taking these medications        acetaminophen 500 MG tablet  Commonly known as:  TYLENOL     aspirin 81 MG tablet  Replaced by:  aspirin 325 MG EC tablet     levalbuterol 0.63 MG/3ML nebulizer solution  Commonly known as:  XOPENEX      TAKE these medications        aspirin 325 MG EC tablet  Take 1 tablet (325 mg total) by mouth 2 (two) times daily.  atenolol 25 MG tablet  Commonly known as:  TENORMIN  Take 25 mg by mouth daily before breakfast.     calcium carbonate 500 MG chewable tablet  Commonly known as:  TUMS - dosed in mg elemental calcium  Chew 2 tablets by mouth daily as needed for indigestion or heartburn.     diphenhydrAMINE 25 MG tablet  Commonly known as:  BENADRYL  Take 25 mg by mouth at bedtime as needed for allergies.     docusate sodium 100 MG capsule  Commonly known as:  COLACE  Take 1 capsule (100 mg total) by mouth 2 (two) times daily.     ferrous sulfate 325 (65 FE) MG tablet  Take 1 tablet (325 mg total) by mouth 3 (three) times daily after meals.     fish oil-omega-3 fatty acids 1000 MG capsule  Take 1 g by mouth every Monday, Wednesday, and Friday.     fluticasone furoate-vilanterol 100-25 MCG/INH Aepb  Commonly known as:  BREO ELLIPTA  Inhale 1 puff then rinse mouth once daily     HYDROcodone-acetaminophen 5-325 MG tablet  Commonly known as:  NORCO/VICODIN  Take 1-2 tablets by mouth every 4 (four) hours as needed for severe pain.     LIVALO 4 MG Tabs  Generic drug:  Pitavastatin Calcium  Take 2 mg by mouth every Monday, Wednesday, and Friday.     losartan 25 MG tablet  Commonly known as:  COZAAR  Take 12.5-25 mg by mouth 2 (two) times daily. She takes half a tablet in the morning and a whole tablet daily after supper.     multivitamin tablet  Take 1 tablet by mouth daily.     nitroGLYCERIN 0.4 MG SL tablet  Commonly known as:  NITROSTAT  Place 1 tablet (0.4 mg total) under the tongue every 5 (five) minutes as needed for chest  pain.     polyethylene glycol packet  Commonly known as:  MIRALAX / GLYCOLAX  Take 17 g by mouth 2 (two) times daily.     SYSTANE 0.4-0.3 % Soln  Generic drug:  Polyethyl Glycol-Propyl Glycol  Place 1 drop into both eyes daily as needed (For dry eyes.).     tiZANidine 4 MG tablet  Commonly known as:  ZANAFLEX  Take 1 tablet (4 mg total) by mouth every 6 (six) hours as needed for muscle spasms.     traMADol 50 MG tablet  Commonly known as:  ULTRAM  Take 1-2 tablets (50-100 mg total) by mouth every 6 (six) hours as needed.         Signed: [place]. Tanor Glaspy   PA-C  08/09/2015, 1:34 PM

## 2015-08-16 ENCOUNTER — Encounter: Payer: Self-pay | Admitting: Cardiology

## 2015-08-16 LAB — CUP PACEART REMOTE DEVICE CHECK
Battery Impedance: 136 Ohm
Brady Statistic AP VS Percent: 79 %
Brady Statistic AS VS Percent: 21 %
Date Time Interrogation Session: 20170412140007
Implantable Lead Implant Date: 20150826
Implantable Lead Location: 753859
Implantable Lead Location: 753860
Lead Channel Impedance Value: 674 Ohm
Lead Channel Pacing Threshold Pulse Width: 0.4 ms
Lead Channel Sensing Intrinsic Amplitude: 1 mV
Lead Channel Sensing Intrinsic Amplitude: 11.2 mV
Lead Channel Setting Pacing Amplitude: 2 V
Lead Channel Setting Pacing Pulse Width: 0.4 ms
MDC IDC LEAD IMPLANT DT: 20150826
MDC IDC MSMT BATTERY REMAINING LONGEVITY: 134 mo
MDC IDC MSMT BATTERY VOLTAGE: 2.79 V
MDC IDC MSMT LEADCHNL RA IMPEDANCE VALUE: 422 Ohm
MDC IDC MSMT LEADCHNL RA PACING THRESHOLD AMPLITUDE: 0.375 V
MDC IDC MSMT LEADCHNL RA PACING THRESHOLD PULSEWIDTH: 0.4 ms
MDC IDC MSMT LEADCHNL RV PACING THRESHOLD AMPLITUDE: 0.625 V
MDC IDC SET LEADCHNL RV PACING AMPLITUDE: 2.5 V
MDC IDC SET LEADCHNL RV SENSING SENSITIVITY: 5.6 mV
MDC IDC STAT BRADY AP VP PERCENT: 0 %
MDC IDC STAT BRADY AS VP PERCENT: 0 %

## 2015-08-19 ENCOUNTER — Ambulatory Visit: Payer: Commercial Managed Care - HMO | Admitting: Internal Medicine

## 2015-08-25 ENCOUNTER — Ambulatory Visit: Payer: Commercial Managed Care - HMO | Admitting: Internal Medicine

## 2015-10-05 ENCOUNTER — Ambulatory Visit (INDEPENDENT_AMBULATORY_CARE_PROVIDER_SITE_OTHER): Payer: Commercial Managed Care - HMO | Admitting: *Deleted

## 2015-10-05 DIAGNOSIS — I495 Sick sinus syndrome: Secondary | ICD-10-CM | POA: Diagnosis not present

## 2015-10-05 NOTE — Progress Notes (Signed)
Remote pacemaker transmission.   

## 2015-10-07 ENCOUNTER — Encounter: Payer: Self-pay | Admitting: Cardiology

## 2015-10-07 LAB — CUP PACEART REMOTE DEVICE CHECK
Battery Remaining Longevity: 136 mo
Battery Voltage: 2.79 V
Brady Statistic AP VS Percent: 68 %
Brady Statistic AS VP Percent: 0 %
Date Time Interrogation Session: 20170712121745
Implantable Lead Implant Date: 20150826
Implantable Lead Model: 5076
Implantable Lead Model: 5076
Lead Channel Pacing Threshold Amplitude: 0.5 V
Lead Channel Pacing Threshold Amplitude: 0.625 V
Lead Channel Pacing Threshold Pulse Width: 0.4 ms
Lead Channel Sensing Intrinsic Amplitude: 1.4 mV
Lead Channel Setting Pacing Amplitude: 2 V
Lead Channel Setting Pacing Amplitude: 2.5 V
Lead Channel Setting Sensing Sensitivity: 4 mV
MDC IDC LEAD IMPLANT DT: 20150826
MDC IDC LEAD LOCATION: 753859
MDC IDC LEAD LOCATION: 753860
MDC IDC MSMT BATTERY IMPEDANCE: 136 Ohm
MDC IDC MSMT LEADCHNL RA IMPEDANCE VALUE: 406 Ohm
MDC IDC MSMT LEADCHNL RV IMPEDANCE VALUE: 595 Ohm
MDC IDC MSMT LEADCHNL RV PACING THRESHOLD PULSEWIDTH: 0.4 ms
MDC IDC MSMT LEADCHNL RV SENSING INTR AMPL: 11.2 mV
MDC IDC SET LEADCHNL RV PACING PULSEWIDTH: 0.4 ms
MDC IDC STAT BRADY AP VP PERCENT: 0 %
MDC IDC STAT BRADY AS VS PERCENT: 32 %

## 2016-01-04 ENCOUNTER — Encounter: Payer: Self-pay | Admitting: Internal Medicine

## 2016-01-17 ENCOUNTER — Encounter: Payer: Self-pay | Admitting: Internal Medicine

## 2016-01-17 ENCOUNTER — Ambulatory Visit (INDEPENDENT_AMBULATORY_CARE_PROVIDER_SITE_OTHER): Payer: Commercial Managed Care - HMO | Admitting: Internal Medicine

## 2016-01-17 VITALS — BP 120/78 | HR 68 | Ht 64.0 in | Wt 174.8 lb

## 2016-01-17 DIAGNOSIS — Z950 Presence of cardiac pacemaker: Secondary | ICD-10-CM | POA: Diagnosis not present

## 2016-01-17 DIAGNOSIS — I495 Sick sinus syndrome: Secondary | ICD-10-CM | POA: Diagnosis not present

## 2016-01-17 MED ORDER — ATENOLOL 25 MG PO TABS: 25.0000 mg | ORAL_TABLET | Freq: Every day | ORAL | 0 refills | Status: DC

## 2016-01-17 NOTE — Patient Instructions (Addendum)
Medication Instructions:  Your physician recommends that you continue on your current medications as directed. Please refer to the Current Medication list given to you today.   Labwork: None Ordered   Testing/Procedures: None Ordered   Follow-Up: Your physician wants you to follow-up in: 1 year with Dr. Lovena Le. You will receive a reminder letter in the mail two months in advance. If you don't receive a letter, please call our office to schedule the follow-up appointment.  Remote monitoring is used to monitor your Pacemaker from home. This monitoring reduces the number of office visits required to check your device to one time per year. It allows [place] to keep an eye on the functioning of your device to ensure it is working properly. You are scheduled for a device check from home on 04/17/16. You may send your transmission at any time that day. If you have a wireless device, the transmission will be sent automatically. After your physician reviews your transmission, you will receive a postcard with your next transmission date.    Any Other Special Instructions Will Be Listed Below (If Applicable).     If you need a refill on your cardiac medications before your next appointment, please call your pharmacy.

## 2016-01-17 NOTE — Progress Notes (Signed)
HPI [name] returns today for followup. She is a pleasant 82 yo woman with symptomatic sinus node dysfunction and heart block who underwent PPM insertion approx. 2 years ago. In the interim, she has done well with no chest pain or sob. She has not had any palpitations or syncope.  She has been stressed out by her husbands having to have 2 different surgeries on his back and is now in rehab. Allergies  Allergen Reactions  . Tape Other (See Comments)    PAPER TAPE -Raw, itching, bleeding skin  . Codeine Nausea And Vomiting and Other (See Comments)  . Epinephrine Other (See Comments)    Enhances longevity of Novocaine - severe heart palpatations   . Other Nausea And Vomiting    [name]  . Oxycodone-Acetaminophen     Other reaction(s): Dizziness (intolerance)  . Penicillins Other (See Comments)    Heart palpitations Has patient had a PCN reaction causing immediate rash, facial/tongue/throat swelling, SOB or lightheadedness with hypotension: no Has patient had a PCN reaction causing severe rash involving mucus membranes or skin necrosis: no Has patient had a PCN reaction that required hospitalization yes - caused her to get a heart catheterization Has patient had a PCN reaction occurring within the last 10 years: about 22 years ago If all of the above answers are "NO", then may proceed with C  . Percodan [Oxycodone-Aspirin] Nausea And Vomiting  . Pravastatin Other (See Comments)    Statins cause cramps  . Vancomycin Hives     Current Outpatient Prescriptions  Medication Sig Dispense Refill  . Alum Hydroxide-Mag Carbonate (GAVISCON PO) Take 1 tablet by mouth daily as needed (heartburn).    Marland Kitchen atenolol (TENORMIN) 25 MG tablet Take 25 mg by mouth daily before breakfast.     . calcium carbonate (TUMS - DOSED IN MG ELEMENTAL CALCIUM) 500 MG chewable tablet Chew 2 tablets by mouth daily as needed for indigestion or heartburn.    . clonazePAM (KLONOPIN) 0.5 MG tablet Take 1 tablet by  mouth 2 (two) times daily as needed for anxiety.  2  . diphenhydrAMINE (BENADRYL) 25 MG tablet Take 25 mg by mouth at bedtime as needed for allergies.    . ferrous sulfate 325 (65 FE) MG tablet Take 1 tablet (325 mg total) by mouth 3 (three) times daily after meals.  3  . fish oil-omega-3 fatty acids 1000 MG capsule Take 1 g by mouth every Monday, Wednesday, and Friday.     . Fluticasone Furoate-Vilanterol (BREO ELLIPTA) 100-25 MCG/INH AEPB Inhale 1 puff then rinse mouth once daily 1 each 0  . HYDROcodone-acetaminophen (NORCO/VICODIN) 5-325 MG tablet Take 1-2 tablets by mouth every 4 (four) hours as needed for severe pain. 30 tablet 0  . losartan (COZAAR) 25 MG tablet Take 12.5-25 mg by mouth 2 (two) times daily. She takes half a tablet in the morning and a whole tablet daily after supper.    . Multiple Vitamin (MULTIVITAMIN) tablet Take 1 tablet by mouth daily.    . nitroGLYCERIN (NITROSTAT) 0.4 MG SL tablet Place 1 tablet (0.4 mg total) under the tongue every 5 (five) minutes as needed for chest pain. (Patient taking differently: Place 0.4 mg under the tongue every 5 (five) minutes as needed for chest pain (MAX 3 TABLETS). ) 25 tablet 3  . Pitavastatin Calcium (LIVALO) 4 MG TABS Take 2 mg by mouth every Monday, Wednesday, and Friday.    Vladimir Faster Glycol-Propyl Glycol (SYSTANE) 0.4-0.3 % SOLN Place 1  drop into both eyes daily as needed (For dry eyes.).    Marland Kitchen polyethylene glycol (MIRALAX / GLYCOLAX) packet Take 17 g by mouth 2 (two) times daily. 14 each 0  . tiZANidine (ZANAFLEX) 4 MG tablet Take 1 tablet (4 mg total) by mouth every 6 (six) hours as needed for muscle spasms. 40 tablet 0  . traMADol (ULTRAM) 50 MG tablet Take 50-100 mg by mouth every 6 (six) hours as needed (Pain).     No current facility-administered medications for this visit.      Past Medical History:  Diagnosis Date  . Acquired absence of breast and nipple   . Acute myocardial infarction, unspecified site, episode of care  unspecified    1965 and 2015   . ALLERGIC RHINITIS   . Anginal pain ([place])   . Arthritis   . Asthma   . Blindness of left eye    decreased vision in left eye related to ocular occlusion   . Breast cancer ([place])    left mastectomy  . Complication of anesthesia   . Coronary atherosclerosis   . Cough   . Dysfunction of eustachian tube   . Esophageal reflux   . Heart murmur   . Hemorrhoids   . Hyperlipidemia   . Hypertension   . Peripheral vascular disease ([place])   . Pneumonia    hx of walking pneumonia x 2   . PONV (postoperative nausea and vomiting)   . Presence of permanent cardiac pacemaker   . PVC's (premature ventricular contractions)   . Shortness of breath   . Stress incontinence   . Syncope 11/17/2013    ROS:   All systems reviewed and negative except as noted in the HPI.   Past Surgical History:  Procedure Laterality Date  . ABDOMINAL HYSTERECTOMY  1967  . CARDIAC CATHETERIZATION  03/01/1986   normal coronaries (Dr. Domenic Moras)  . CARDIAC CATHETERIZATION  10/25/2002   normal L main; LAD w/40% narrowing in prox 3rd and 60-70% narrowing beyond 1st diagonal, LAD was tortuous; dominant RCA with 30-40% segmental narrowing and 20-30% narrowing at junction of prox 3rd (Dr. Marella Chimes)  . CARDIAC CATHETERIZATION  04/11/2006   trivial luminal irregularities in coronaries and mid LAD 50% (Dr. Domenic Moras)  . CARDIOPULMONARY MET TEST  05/05/2012   excellent effort w/RER 1.06, peak VO2>100%, peak HR 81%, good functional capacity  . CAROTID DOPPLER  2005   normal study (ordered for swelling & pain, left neck clavicle to ear)  . DILATION AND CURETTAGE OF UTERUS  1962-1968   x4  . GALLBLADDER SURGERY  1985  . MASTECTOMY Left 1981  . MYOMECTOMY  1968  . NM MYOCAR PERF WALL MOTION  2012   bruce myoview -no inducible ischemia, EF 71%, low risk scan  . PERMANENT PACEMAKER INSERTION N/A 11/18/2013   Procedure: PERMANENT PACEMAKER INSERTION;  Surgeon: Evans Lance, MD;  Location: [facility]  CATH LAB;  Service: Cardiovascular;  Laterality: N/A;  . PLACEMENT OF BREAST IMPLANTS  1993   [facility]  . REMOVAL OF BILATERAL TISSUE EXPANDERS WITH PLACEMENT OF BILATERAL BREAST IMPLANTS    . TOTAL HIP ARTHROPLASTY Right 08/02/2015   Procedure: RIGHT TOTAL HIP ARTHROPLASTY ANTERIOR APPROACH;  Surgeon: Paralee Cancel, MD;  Location: [facility] ORS;  Service: Orthopedics;  Laterality: Right;  . TRANSTHORACIC ECHOCARDIOGRAM  2014   EF 96-78%, grade 1 diastolic dysfunction; mildly thickened MV leaflets, trivial regurg      Family History  Problem Relation Age of Onset  . Heart  attack Brother   . Stroke Brother   . Lymphoma Sister   . Colon cancer Sister   . Heart disease Mother   . Stroke Mother   . Colon cancer Mother 60  . Cancer Mother   . CAD Brother     + stents  . CAD Brother     + stents  . Stroke Brother   . Cervical cancer Sister   . Liver cancer Brother   . Heart disease Maternal Grandmother   . Stroke Maternal Grandmother   . Heart disease Maternal Grandfather   . Cancer Maternal Grandfather   . Heart disease Paternal Grandmother   . Stroke Paternal Grandfather   . Cancer Sister   . Cancer Brother      Social History   Social History  . Marital status: Married    Spouse name: N/A  . Number of children: 1  . Years of education: N/A   Occupational History  . RETIRED-swim coach and physical educator    Social History Main Topics  . Smoking status: Never Smoker  . Smokeless tobacco: Never Used  . Alcohol use Yes     Comment: 2 glasses of wine with dinner occasional   . Drug use: No  . Sexual activity: Not on file   Other Topics Concern  . Not on file   Social History Narrative  . No narrative on file     BP 120/78   Pulse 68   Ht 5' 4"  (1.626 m)   Wt 174 lb 12.8 oz (79.3 kg)   BMI 30.00 kg/m   Physical Exam:  Well appearing 82 yo woman, NAD HEENT: Unremarkable Neck:  7 cm JVD, no thyromegally Lymphatics:  No adenopathy Back:  No CVA  tenderness Lungs:  Clear with no wheezes, well healed right sided PM incision. HEART:  Regular rate rhythm, no murmurs, no rubs, no clicks Abd:  soft, positive bowel sounds, no organomegally, no rebound, no guarding Ext:  2 plus pulses, no edema, no cyanosis, no clubbing Skin:  No rashes no nodules Neuro:  CN II through XII intact, motor grossly intact   DEVICE  Normal device function.  See PaceArt for details.   Assess/Plan: 1. Sinus node dysfunction - she is asymptomatic now s/p PPM insertion. 2. PPM - her Medtronic DDD PM is working normally. 3. HTN - her blood pressure is well controlled. Will follow.  Mikle Bosworth.D.

## 2016-01-18 LAB — CUP PACEART INCLINIC DEVICE CHECK
Battery Impedance: 160 Ohm
Brady Statistic AP VP Percent: 0 %
Brady Statistic AS VS Percent: 28 %
Implantable Lead Implant Date: 20150826
Implantable Lead Location: 753860
Implantable Lead Model: 5076
Implantable Lead Model: 5076
Lead Channel Impedance Value: 588 Ohm
Lead Channel Pacing Threshold Amplitude: 0.5 V
Lead Channel Sensing Intrinsic Amplitude: 11.2 mV
Lead Channel Sensing Intrinsic Amplitude: 2 mV
MDC IDC LEAD IMPLANT DT: 20150826
MDC IDC LEAD LOCATION: 753859
MDC IDC MSMT BATTERY REMAINING LONGEVITY: 128 mo
MDC IDC MSMT BATTERY VOLTAGE: 2.79 V
MDC IDC MSMT LEADCHNL RA IMPEDANCE VALUE: 385 Ohm
MDC IDC MSMT LEADCHNL RA PACING THRESHOLD PULSEWIDTH: 0.4 ms
MDC IDC MSMT LEADCHNL RV PACING THRESHOLD AMPLITUDE: 0.5 V
MDC IDC MSMT LEADCHNL RV PACING THRESHOLD PULSEWIDTH: 0.4 ms
MDC IDC SESS DTM: 20171024155029
MDC IDC SET LEADCHNL RA PACING AMPLITUDE: 2 V
MDC IDC SET LEADCHNL RV PACING AMPLITUDE: 2.5 V
MDC IDC SET LEADCHNL RV PACING PULSEWIDTH: 0.4 ms
MDC IDC SET LEADCHNL RV SENSING SENSITIVITY: 4 mV
MDC IDC STAT BRADY AP VS PERCENT: 72 %
MDC IDC STAT BRADY AS VP PERCENT: 0 %

## 2016-02-10 ENCOUNTER — Other Ambulatory Visit: Payer: Self-pay | Admitting: Internal Medicine

## 2016-03-20 ENCOUNTER — Other Ambulatory Visit: Payer: Self-pay | Admitting: Internal Medicine

## 2016-03-20 NOTE — Telephone Encounter (Signed)
REFILL 

## 2016-03-28 DIAGNOSIS — H52201 Unspecified astigmatism, right eye: Secondary | ICD-10-CM | POA: Diagnosis not present

## 2016-03-28 DIAGNOSIS — H401131 Primary open-angle glaucoma, bilateral, mild stage: Secondary | ICD-10-CM | POA: Diagnosis not present

## 2016-03-28 DIAGNOSIS — H5201 Hypermetropia, right eye: Secondary | ICD-10-CM | POA: Diagnosis not present

## 2016-04-17 ENCOUNTER — Ambulatory Visit (INDEPENDENT_AMBULATORY_CARE_PROVIDER_SITE_OTHER): Payer: Medicare HMO | Admitting: *Deleted

## 2016-04-17 DIAGNOSIS — I495 Sick sinus syndrome: Secondary | ICD-10-CM | POA: Diagnosis not present

## 2016-04-18 ENCOUNTER — Telehealth: Payer: Self-pay | Admitting: Cardiology

## 2016-04-18 DIAGNOSIS — R8299 Other abnormal findings in urine: Secondary | ICD-10-CM | POA: Diagnosis not present

## 2016-04-18 DIAGNOSIS — I10 Essential (primary) hypertension: Secondary | ICD-10-CM | POA: Diagnosis not present

## 2016-04-18 DIAGNOSIS — E784 Other hyperlipidemia: Secondary | ICD-10-CM | POA: Diagnosis not present

## 2016-04-18 DIAGNOSIS — R7301 Impaired fasting glucose: Secondary | ICD-10-CM | POA: Diagnosis not present

## 2016-04-18 DIAGNOSIS — Z0000 Encounter for general adult medical examination without abnormal findings: Secondary | ICD-10-CM | POA: Diagnosis not present

## 2016-04-18 DIAGNOSIS — E538 Deficiency of other specified B group vitamins: Secondary | ICD-10-CM | POA: Diagnosis not present

## 2016-04-18 DIAGNOSIS — M859 Disorder of bone density and structure, unspecified: Secondary | ICD-10-CM | POA: Diagnosis not present

## 2016-04-18 NOTE — Telephone Encounter (Signed)
LMOVM reminding pt to send remote transmission.   

## 2016-04-20 ENCOUNTER — Encounter: Payer: Self-pay | Admitting: Cardiology

## 2016-04-20 NOTE — Progress Notes (Signed)
Remote pacemaker transmission.   

## 2016-04-24 ENCOUNTER — Encounter: Payer: Self-pay | Admitting: Cardiology

## 2016-04-24 LAB — CUP PACEART REMOTE DEVICE CHECK
Battery Impedance: 160 Ohm
Battery Remaining Longevity: 127 mo
Battery Voltage: 2.79 V
Brady Statistic AP VS Percent: 82 %
Date Time Interrogation Session: 20180126154942
Implantable Lead Implant Date: 20150826
Implantable Lead Location: 753859
Implantable Lead Model: 5076
Implantable Pulse Generator Implant Date: 20150826
Lead Channel Pacing Threshold Amplitude: 0.625 V
Lead Channel Pacing Threshold Pulse Width: 0.4 ms
Lead Channel Setting Pacing Amplitude: 2 V
Lead Channel Setting Pacing Amplitude: 2.5 V
Lead Channel Setting Pacing Pulse Width: 0.4 ms
MDC IDC LEAD IMPLANT DT: 20150826
MDC IDC LEAD LOCATION: 753860
MDC IDC MSMT LEADCHNL RA IMPEDANCE VALUE: 433 Ohm
MDC IDC MSMT LEADCHNL RA PACING THRESHOLD AMPLITUDE: 0.375 V
MDC IDC MSMT LEADCHNL RV IMPEDANCE VALUE: 623 Ohm
MDC IDC MSMT LEADCHNL RV PACING THRESHOLD PULSEWIDTH: 0.4 ms
MDC IDC SET LEADCHNL RV SENSING SENSITIVITY: 5.6 mV
MDC IDC STAT BRADY AP VP PERCENT: 0 %
MDC IDC STAT BRADY AS VP PERCENT: 0 %
MDC IDC STAT BRADY AS VS PERCENT: 17 %

## 2016-04-25 DIAGNOSIS — J45998 Other asthma: Secondary | ICD-10-CM | POA: Diagnosis not present

## 2016-04-25 DIAGNOSIS — Z0000 Encounter for general adult medical examination without abnormal findings: Secondary | ICD-10-CM | POA: Diagnosis not present

## 2016-04-25 DIAGNOSIS — M1611 Unilateral primary osteoarthritis, right hip: Secondary | ICD-10-CM | POA: Diagnosis not present

## 2016-04-25 DIAGNOSIS — H8103 Meniere's disease, bilateral: Secondary | ICD-10-CM | POA: Diagnosis not present

## 2016-04-25 DIAGNOSIS — I872 Venous insufficiency (chronic) (peripheral): Secondary | ICD-10-CM | POA: Diagnosis not present

## 2016-04-25 DIAGNOSIS — I2510 Atherosclerotic heart disease of native coronary artery without angina pectoris: Secondary | ICD-10-CM | POA: Diagnosis not present

## 2016-04-25 DIAGNOSIS — H348122 Central retinal vein occlusion, left eye, stable: Secondary | ICD-10-CM | POA: Diagnosis not present

## 2016-04-25 DIAGNOSIS — I493 Ventricular premature depolarization: Secondary | ICD-10-CM | POA: Diagnosis not present

## 2016-04-25 DIAGNOSIS — Z950 Presence of cardiac pacemaker: Secondary | ICD-10-CM | POA: Diagnosis not present

## 2016-04-25 DIAGNOSIS — R0609 Other forms of dyspnea: Secondary | ICD-10-CM | POA: Diagnosis not present

## 2016-04-26 ENCOUNTER — Telehealth: Payer: Self-pay | Admitting: Internal Medicine

## 2016-04-26 DIAGNOSIS — H34812 Central retinal vein occlusion, left eye, with macular edema: Secondary | ICD-10-CM | POA: Diagnosis not present

## 2016-04-26 DIAGNOSIS — H40003 Preglaucoma, unspecified, bilateral: Secondary | ICD-10-CM | POA: Diagnosis not present

## 2016-04-26 DIAGNOSIS — H3581 Retinal edema: Secondary | ICD-10-CM | POA: Diagnosis not present

## 2016-04-26 NOTE — Telephone Encounter (Signed)
New message      Did you get her home remote transmission?  Pt got a letter.

## 2016-04-26 NOTE — Telephone Encounter (Signed)
Informed pt that her that pt remote transmission was received. Pt verbalized understanding.

## 2016-05-01 NOTE — Progress Notes (Signed)
Cardiology Office Note   Date:  05/03/2016   ID:  Patriciann, Becht Dec 07, 1933, MRN [id number]  PCP:  Jerlyn Ly, MD  Cardiologist:   Dorris Carnes, MD   F/U of CAD  And HTn      History of Present Illness: DEHLIA KILNER is a 83 y.o. female with a history of mild to mod CAD  Echo with normal LVEF  Myvoew 2015 normal  Cardiopum stress test ? Small vessel dz vs deconditioning.  Hx PPM I saw her in clinic in May 2017   Since I saw her she has been seen by Beckie Salts  In October    Pt very busy helping husband who is debilitated   Busy   No CP  Gets some SOB with moving with husband  Resting better  Seen by Dr Joylene Draft who is working to get [place] approved      Current Meds  Medication Sig  . Alum Hydroxide-Mag Carbonate (GAVISCON PO) Take 1 tablet by mouth daily as needed (heartburn).  Marland Kitchen atenolol (TENORMIN) 25 MG tablet TAKE 1 TABLET BY MOUTH DAILY BEFORE BREAKFAST.  . calcium carbonate (TUMS - DOSED IN MG ELEMENTAL CALCIUM) 500 MG chewable tablet Chew 2 tablets by mouth daily as needed for indigestion or heartburn.  . clonazePAM (KLONOPIN) 0.5 MG tablet Take 1 tablet by mouth 2 (two) times daily as needed for anxiety.  . diphenhydrAMINE (BENADRYL) 25 MG tablet Take 25 mg by mouth at bedtime as needed for allergies.  . fish oil-omega-3 fatty acids 1000 MG capsule Take 1 g by mouth every Monday, Wednesday, and Friday.   . Fluticasone Furoate-Vilanterol (BREO ELLIPTA) 100-25 MCG/INH AEPB Inhale 1 puff then rinse mouth once daily  . HYDROcodone-acetaminophen (NORCO/VICODIN) 5-325 MG tablet Take 1-2 tablets by mouth every 4 (four) hours as needed for severe pain.  Marland Kitchen losartan (COZAAR) 25 MG tablet Take 12.5-25 mg by mouth 2 (two) times daily. She takes half a tablet in the morning and a whole tablet daily after supper.  . Multiple Vitamin (MULTIVITAMIN) tablet Take 1 tablet by mouth daily.  Marland Kitchen NITROSTAT 0.4 MG SL tablet PLACE 1 TABLET (0.4 MG TOTAL) UNDER THE TONGUE EVERY 5 (FIVE)  MINUTES AS NEEDED FOR CHEST PAIN.  Marland Kitchen Polyethyl Glycol-Propyl Glycol (SYSTANE) 0.4-0.3 % SOLN Place 1 drop into both eyes daily as needed (For dry eyes.).  Marland Kitchen polyethylene glycol (MIRALAX / GLYCOLAX) packet Take 17 g by mouth 2 (two) times daily.     Allergies:   Tape; Codeine; Epinephrine; Other; Oxycodone-acetaminophen; Penicillins; Percodan [oxycodone-aspirin]; Pravastatin; and Vancomycin   Past Medical History:  Diagnosis Date  . Acquired absence of breast and nipple   . Acute myocardial infarction, unspecified site, episode of care unspecified    1965 and 2015   . ALLERGIC RHINITIS   . Anginal pain ([place])   . Arthritis   . Asthma   . Blindness of left eye    decreased vision in left eye related to ocular occlusion   . Breast cancer ([place])    left mastectomy  . Complication of anesthesia   . Coronary atherosclerosis   . Cough   . Dysfunction of eustachian tube   . Esophageal reflux   . Heart murmur   . Hemorrhoids   . Hyperlipidemia   . Hypertension   . Peripheral vascular disease ([place])   . Pneumonia    hx of walking pneumonia x 2   . PONV (postoperative nausea and vomiting)   . Presence  of permanent cardiac pacemaker   . PVC's (premature ventricular contractions)   . Shortness of breath   . Stress incontinence   . Syncope 11/17/2013    Past Surgical History:  Procedure Laterality Date  . ABDOMINAL HYSTERECTOMY  1967  . CARDIAC CATHETERIZATION  03/01/1986   normal coronaries (Dr. Domenic Moras)  . CARDIAC CATHETERIZATION  10/25/2002   normal L main; LAD w/40% narrowing in prox 3rd and 60-70% narrowing beyond 1st diagonal, LAD was tortuous; dominant RCA with 30-40% segmental narrowing and 20-30% narrowing at junction of prox 3rd (Dr. Marella Chimes)  . CARDIAC CATHETERIZATION  04/11/2006   trivial luminal irregularities in coronaries and mid LAD 50% (Dr. Domenic Moras)  . CARDIOPULMONARY MET TEST  05/05/2012   excellent effort w/RER 1.06, peak VO2>100%, peak HR 81%, good functional  capacity  . CAROTID DOPPLER  2005   normal study (ordered for swelling & pain, left neck clavicle to ear)  . DILATION AND CURETTAGE OF UTERUS  1962-1968   x4  . GALLBLADDER SURGERY  1985  . MASTECTOMY Left 1981  . MYOMECTOMY  1968  . NM MYOCAR PERF WALL MOTION  2012   bruce myoview -no inducible ischemia, EF 71%, low risk scan  . PERMANENT PACEMAKER INSERTION N/A 11/18/2013   Procedure: PERMANENT PACEMAKER INSERTION;  Surgeon: Evans Lance, MD;  Location: [facility] CATH LAB;  Service: Cardiovascular;  Laterality: N/A;  . PLACEMENT OF BREAST IMPLANTS  1993   [facility]  . REMOVAL OF BILATERAL TISSUE EXPANDERS WITH PLACEMENT OF BILATERAL BREAST IMPLANTS    . TOTAL HIP ARTHROPLASTY Right 08/02/2015   Procedure: RIGHT TOTAL HIP ARTHROPLASTY ANTERIOR APPROACH;  Surgeon: Paralee Cancel, MD;  Location: [facility] ORS;  Service: Orthopedics;  Laterality: Right;  . TRANSTHORACIC ECHOCARDIOGRAM  2014   EF 85-27%, grade 1 diastolic dysfunction; mildly thickened MV leaflets, trivial regurg      Social History:  The patient  reports that she has never smoked. She has never used smokeless tobacco. She reports that she drinks alcohol. She reports that she does not use drugs.   Family History:  The patient's family history includes CAD in her brother and brother; Cancer in her brother, maternal grandfather, mother, and sister; Cervical cancer in her sister; Colon cancer in her sister; Colon cancer (age of onset: 63) in her mother; Heart attack in her brother; Heart disease in her maternal grandfather, maternal grandmother, mother, and paternal grandmother; Liver cancer in her brother; Lymphoma in her sister; Stroke in her brother, brother, maternal grandmother, mother, and paternal grandfather.    ROS:  Please see the history of present illness. All other systems are reviewed and  Negative to the above problem except as noted.    PHYSICAL EXAM: VS:  BP 124/70   Pulse 64   Ht 5' 4"  (1.626 m)   Wt 175 lb 1.9 oz (79.4 kg)    SpO2 97%   BMI 30.06 kg/m   GEN: Well nourished, well developed, in no acute distress  HEENT: normal  Neck: no JVD, carotid bruits, or masses Cardiac: RRR; no murmurs, rubs, or gallops,no edema  Respiratory:  clear to auscultation bilaterally, normal work of breathing GI: soft, nontender, nondistended, + BS  No hepatomegaly  MS: no deformity Moving all extremities   Skin: warm and dry, no rash Neuro:  Strength and sensation are intact Psych: euthymic mood, full affect   EKG:  EKG is not  ordered today.   Lipid Panel    Component Value Date/Time  CHOL 176 10/28/2014 0440   TRIG 126 10/28/2014 0440   HDL 42 10/28/2014 0440   CHOLHDL 4.2 10/28/2014 0440   VLDL 25 10/28/2014 0440   LDLCALC 109 (H) 10/28/2014 0440      Wt Readings from Last 3 Encounters:  05/03/16 175 lb 1.9 oz (79.4 kg)  01/17/16 174 lb 12.8 oz (79.3 kg)  08/02/15 174 lb (78.9 kg)      ASSESSMENT AND PLAN:  1  CAD  No symptoms of angina    2  HTN BP adequately controlled   3  HL  WIll get labs frim Dr Perini's office  Note that he is trying to get Repatha approved for pt  I told her if there are problems to contact our office and we will try as well    4  PPM   Follws with Beckie Salts  F/U in 9 to 10 months     Current medicines are reviewed at length with the patient today.  The patient does not have concerns regarding medicines.  Signed, Dorris Carnes, MD  05/03/2016 9:55 AM    [address], [place], [place]  [address] Phone: [phone]; Fax: [phone]

## 2016-05-03 ENCOUNTER — Encounter: Payer: Self-pay | Admitting: Internal Medicine

## 2016-05-03 ENCOUNTER — Ambulatory Visit (INDEPENDENT_AMBULATORY_CARE_PROVIDER_SITE_OTHER): Payer: Medicare HMO | Admitting: Internal Medicine

## 2016-05-03 VITALS — BP 124/70 | HR 64 | Ht 64.0 in | Wt 175.1 lb

## 2016-05-03 DIAGNOSIS — I2510 Atherosclerotic heart disease of native coronary artery without angina pectoris: Secondary | ICD-10-CM | POA: Diagnosis not present

## 2016-05-03 DIAGNOSIS — I10 Essential (primary) hypertension: Secondary | ICD-10-CM

## 2016-05-03 DIAGNOSIS — E785 Hyperlipidemia, unspecified: Secondary | ICD-10-CM | POA: Diagnosis not present

## 2016-05-03 NOTE — Patient Instructions (Signed)
Your physician recommends that you continue on your current medications as directed. Please refer to the Current Medication list given to you today. Your physician wants you to follow-up in: 9 MONTHS WITH DR ROSS.  You will receive a reminder letter in the mail two months in advance. If you don't receive a letter, please call our office to schedule the follow-up appointment.  

## 2016-06-01 DIAGNOSIS — D4989 Neoplasm of unspecified behavior of other specified sites: Secondary | ICD-10-CM | POA: Diagnosis not present

## 2016-06-01 DIAGNOSIS — D225 Melanocytic nevi of trunk: Secondary | ICD-10-CM | POA: Diagnosis not present

## 2016-06-01 DIAGNOSIS — D2272 Melanocytic nevi of left lower limb, including hip: Secondary | ICD-10-CM | POA: Diagnosis not present

## 2016-06-01 DIAGNOSIS — D485 Neoplasm of uncertain behavior of skin: Secondary | ICD-10-CM | POA: Diagnosis not present

## 2016-06-01 DIAGNOSIS — L814 Other melanin hyperpigmentation: Secondary | ICD-10-CM | POA: Diagnosis not present

## 2016-06-01 DIAGNOSIS — L820 Inflamed seborrheic keratosis: Secondary | ICD-10-CM | POA: Diagnosis not present

## 2016-06-01 DIAGNOSIS — D1801 Hemangioma of skin and subcutaneous tissue: Secondary | ICD-10-CM | POA: Diagnosis not present

## 2016-06-01 DIAGNOSIS — L821 Other seborrheic keratosis: Secondary | ICD-10-CM | POA: Diagnosis not present

## 2016-06-07 DIAGNOSIS — K219 Gastro-esophageal reflux disease without esophagitis: Secondary | ICD-10-CM | POA: Diagnosis not present

## 2016-06-07 DIAGNOSIS — K5730 Diverticulosis of large intestine without perforation or abscess without bleeding: Secondary | ICD-10-CM | POA: Diagnosis not present

## 2016-06-07 DIAGNOSIS — Z1211 Encounter for screening for malignant neoplasm of colon: Secondary | ICD-10-CM | POA: Diagnosis not present

## 2016-06-07 DIAGNOSIS — K625 Hemorrhage of anus and rectum: Secondary | ICD-10-CM | POA: Diagnosis not present

## 2016-06-07 DIAGNOSIS — Z800 Family history of malignant neoplasm of digestive organs: Secondary | ICD-10-CM | POA: Diagnosis not present

## 2016-06-28 DIAGNOSIS — H34812 Central retinal vein occlusion, left eye, with macular edema: Secondary | ICD-10-CM | POA: Diagnosis not present

## 2016-06-28 DIAGNOSIS — H3581 Retinal edema: Secondary | ICD-10-CM | POA: Diagnosis not present

## 2016-07-11 DIAGNOSIS — Z96641 Presence of right artificial hip joint: Secondary | ICD-10-CM | POA: Diagnosis not present

## 2016-07-11 DIAGNOSIS — Z471 Aftercare following joint replacement surgery: Secondary | ICD-10-CM | POA: Diagnosis not present

## 2016-07-17 ENCOUNTER — Ambulatory Visit (INDEPENDENT_AMBULATORY_CARE_PROVIDER_SITE_OTHER): Payer: Medicare HMO | Admitting: Pharmacist

## 2016-07-17 DIAGNOSIS — E785 Hyperlipidemia, unspecified: Secondary | ICD-10-CM

## 2016-07-17 NOTE — Patient Instructions (Signed)
We send for coverage of Repatha. We will send information for patient assistance.    Cholesterol Cholesterol is a fat. Your body needs a small amount of cholesterol. Cholesterol (plaque) may build up in your blood vessels (arteries). That makes you more likely to have a heart attack or stroke. You cannot feel your cholesterol level. Having a blood test is the only way to find out if your level is high. Keep your test results. Work with your doctor to keep your cholesterol at a good level. What do the results mean?  Total cholesterol is how much cholesterol is in your blood.  LDL is bad cholesterol. This is the type that can build up. Try to have low LDL.  HDL is good cholesterol. It cleans your blood vessels and carries LDL away. Try to have high HDL.  Triglycerides are fat that the body can store or burn for energy. What are good levels of cholesterol?  Total cholesterol below 200.  LDL below 100 is good for people who have health risks. LDL below 70 is good for people who have very high risks.  HDL above 40 is good. It is best to have HDL of 60 or higher.  Triglycerides below 150. How can I lower my cholesterol? Diet  Follow your diet program as told by your doctor.  Choose fish, white meat chicken, or [place] that is roasted or baked. Try not to eat red meat, fried foods, sausage, or lunch meats.  Eat lots of fresh fruits and vegetables.  Choose whole grains, beans, pasta, potatoes, and cereals.  Choose olive oil, corn oil, or canola oil. Only use small amounts.  Try not to eat butter, mayonnaise, shortening, or palm kernel oils.  Try not to eat foods with trans fats.  Choose low-fat or nonfat dairy foods.  Drink skim or nonfat milk.  Eat low-fat or nonfat yogurt and cheeses.  Try not to drink whole milk or cream.  Try not to eat ice cream, egg yolks, or full-fat cheeses.  Healthy desserts include angel food cake, ginger snaps, animal crackers, hard candy,  popsicles, and low-fat or nonfat frozen yogurt. Try not to eat pastries, cakes, pies, and cookies. Exercise  Follow your exercise program as told by your doctor.  Be more active. Try gardening, walking, and taking the stairs.  Ask your doctor about ways that you can be more active. Medicine  Take over-the-counter and prescription medicines only as told by your doctor. This information is not intended to replace advice given to you by your health care provider. Make sure you discuss any questions you have with your health care provider. Document Released: 06/08/2008 Document Revised: 10/12/2015 Document Reviewed: 09/22/2015 Elsevier Interactive Patient Education  2017 [employer].

## 2016-07-17 NOTE — Progress Notes (Signed)
Patient ID: Madison [name]                 DOB: Feb 19, 1934                    MRN: [id number]     HPI: Madison [name] is a 83 y.o. female patient of Dr. Harrington Challenger that presents today for lipid evaluation. PMH includes CAD, Echo with normal LVEF, hx of MI, HTN. She has previously been intolerant to statins including very low dose statin dosed only several times weekly. She was referred by PCP Dr. Joylene Draft for assistance with getting Repatha covered.   She reports a long history of statin intolerance. She is currently taking pitavastatin 2mg  MWF and barely tolerating. She states she is willing continue this dose at this time.   Risk Factors: MI, HTN, age LDL Goal: <70  Current Medications: Fish oil 1g every M, W, F, Taking Pitavastatin 2mg  three times a week - toe curling and muscle aches in legs Intolerances: atorvastatin 10mg  four times a week and two times a week, Pitavastatin 4mg , Crestor, Zetia, Zocor, Pravachol - muscle aches and cramps in legs; lipitor caused cramping that she felt she was having a period every day.   Diet: Most meals prepared from home. Breakfast - 1 egg, steal oatmeal, plain yogurt; lunch - salad spinach, chicken breast or tuna, grapes, celery; Dinner - soup - chicken/turkey vegetable or grilled cheese.   Exercise: Does therapy with husband who is incapacitated.   Family History: The patient's family history includes CAD in her brother and brother; Cancer in her brother, maternal grandfather, mother, and sister; Cervical cancer in her sister; Colon cancer in her sister; Colon cancer (age of onset: 35) in her mother; Heart attack in her brother; Heart disease in her maternal grandfather, maternal grandmother, mother, and paternal grandmother; Liver cancer in her brother; Lymphoma in her sister; Stroke in her brother, brother, maternal grandmother, mother, and paternal grandfather.   Social History: Denies tobacco. Drinks a glass of wine 1-2 times per week.   Labs: 07/13/16:  TC  185, TG 145, HDL 48, LDL 108, nonHDL 137 - on fish oil, Pitivastatin 2mg  MWF  Past Medical History:  Diagnosis Date  . Acquired absence of breast and nipple   . Acute myocardial infarction, unspecified site, episode of care unspecified    1965 and 2015   . ALLERGIC RHINITIS   . Anginal pain ([place])   . Arthritis   . Asthma   . Blindness of left eye    decreased vision in left eye related to ocular occlusion   . Breast cancer ([place])    left mastectomy  . Complication of anesthesia   . Coronary atherosclerosis   . Cough   . Dysfunction of eustachian tube   . Esophageal reflux   . Heart murmur   . Hemorrhoids   . Hyperlipidemia   . Hypertension   . Peripheral vascular disease ([place])   . Pneumonia    hx of walking pneumonia x 2   . PONV (postoperative nausea and vomiting)   . Presence of permanent cardiac pacemaker   . PVC's (premature ventricular contractions)   . Shortness of breath   . Stress incontinence   . Syncope 11/17/2013    Current Outpatient Prescriptions on File Prior to Visit  Medication Sig Dispense Refill  . Alum Hydroxide-Mag Carbonate (GAVISCON PO) Take 1 tablet by mouth daily as needed (heartburn).    Marland Kitchen atenolol (TENORMIN) 25 MG tablet  TAKE 1 TABLET BY MOUTH DAILY BEFORE BREAKFAST. 30 tablet 10  . calcium carbonate (TUMS - DOSED IN MG ELEMENTAL CALCIUM) 500 MG chewable tablet Chew 2 tablets by mouth daily as needed for indigestion or heartburn.    . clonazePAM (KLONOPIN) 0.5 MG tablet Take 1 tablet by mouth 2 (two) times daily as needed for anxiety.  2  . diphenhydrAMINE (BENADRYL) 25 MG tablet Take 25 mg by mouth at bedtime as needed for allergies.    . fish oil-omega-3 fatty acids 1000 MG capsule Take 1 g by mouth every Monday, Wednesday, and Friday.     . Fluticasone Furoate-Vilanterol (BREO ELLIPTA) 100-25 MCG/INH AEPB Inhale 1 puff then rinse mouth once daily 1 each 0  . HYDROcodone-acetaminophen (NORCO/VICODIN) 5-325 MG tablet Take 1-2 tablets by mouth every  4 (four) hours as needed for severe pain. 30 tablet 0  . losartan (COZAAR) 25 MG tablet Take 12.5-25 mg by mouth 2 (two) times daily. She takes half a tablet in the morning and a whole tablet daily after supper.    . Multiple Vitamin (MULTIVITAMIN) tablet Take 1 tablet by mouth daily.    Marland Kitchen NITROSTAT 0.4 MG SL tablet PLACE 1 TABLET (0.4 MG TOTAL) UNDER THE TONGUE EVERY 5 (FIVE) MINUTES AS NEEDED FOR CHEST PAIN. 25 tablet 0  . Polyethyl Glycol-Propyl Glycol (SYSTANE) 0.4-0.3 % SOLN Place 1 drop into both eyes daily as needed (For dry eyes.).    Marland Kitchen polyethylene glycol (MIRALAX / GLYCOLAX) packet Take 17 g by mouth 2 (two) times daily. 14 each 0   No current facility-administered medications on file prior to visit.     Allergies  Allergen Reactions  . Tape Other (See Comments)    PAPER TAPE -Raw, itching, bleeding skin  . Codeine Nausea And Vomiting and Other (See Comments)  . Epinephrine Other (See Comments)    Enhances longevity of Novocaine - severe heart palpatations   . Other Nausea And Vomiting    [name]  . Oxycodone-Acetaminophen     Other reaction(s): Dizziness (intolerance)  . Penicillins Other (See Comments)    Heart palpitations Has patient had a PCN reaction causing immediate rash, facial/tongue/throat swelling, SOB or lightheadedness with hypotension: no Has patient had a PCN reaction causing severe rash involving mucus membranes or skin necrosis: no Has patient had a PCN reaction that required hospitalization yes - caused her to get a heart catheterization Has patient had a PCN reaction occurring within the last 10 years: about 22 years ago If all of the above answers are "NO", then may proceed with C  . Percodan [Oxycodone-Aspirin] Nausea And Vomiting  . Pravastatin Other (See Comments)    Statins cause cramps  . Vancomycin Hives    Assessment/Plan: Hyperlipidemia: LDL not at goal on Fish oil and low dose Livalo. Continue both therapies and will send for coverage of  Repatha through insurance for added LDL reduction.    Thank you,  Lelan Pons. Patterson Hammersmith, [facility]  07/17/2016 7:55 AM

## 2016-07-23 ENCOUNTER — Telehealth: Payer: Self-pay | Admitting: Cardiology

## 2016-07-23 ENCOUNTER — Ambulatory Visit (INDEPENDENT_AMBULATORY_CARE_PROVIDER_SITE_OTHER): Payer: Medicare HMO | Admitting: *Deleted

## 2016-07-23 DIAGNOSIS — I495 Sick sinus syndrome: Secondary | ICD-10-CM | POA: Diagnosis not present

## 2016-07-23 NOTE — Telephone Encounter (Signed)
Spoke with pt and reminded pt of remote transmission that is due today. Pt verbalized understanding.   

## 2016-07-26 ENCOUNTER — Encounter: Payer: Self-pay | Admitting: Cardiology

## 2016-07-26 NOTE — Progress Notes (Signed)
Letter  

## 2016-07-30 ENCOUNTER — Telehealth: Payer: Self-pay | Admitting: Pharmacist

## 2016-07-30 DIAGNOSIS — M5416 Radiculopathy, lumbar region: Secondary | ICD-10-CM | POA: Diagnosis not present

## 2016-07-30 DIAGNOSIS — M25562 Pain in left knee: Secondary | ICD-10-CM | POA: Diagnosis not present

## 2016-07-30 DIAGNOSIS — E785 Hyperlipidemia, unspecified: Secondary | ICD-10-CM

## 2016-07-30 MED ORDER — EVOLOCUMAB 140 MG/ML ~~LOC~~ SOAJ: 140.0000 mg | SUBCUTANEOUS | 11 refills | Status: DC

## 2016-07-30 NOTE — Telephone Encounter (Signed)
Approval of Repatha received - Rx sent to specialty pharmacy.

## 2016-07-31 NOTE — Progress Notes (Signed)
Remote pacemaker transmission.   

## 2016-07-31 NOTE — Telephone Encounter (Signed)
Pt called to report that any cost more than $20 copay per month will be cost-prohibitive at this point. Will send her information to patient assistance foundation today for coverage of Repatha.

## 2016-08-01 LAB — CUP PACEART REMOTE DEVICE CHECK
Battery Impedance: 160 Ohm
Battery Remaining Longevity: 127 mo
Battery Voltage: 2.79 V
Brady Statistic AP VP Percent: 0 %
Brady Statistic AS VP Percent: 0 %
Date Time Interrogation Session: 20180503194352
Implantable Lead Implant Date: 20150826
Implantable Lead Location: 753859
Implantable Lead Model: 5076
Implantable Lead Model: 5076
Implantable Pulse Generator Implant Date: 20150826
Lead Channel Impedance Value: 406 Ohm
Lead Channel Impedance Value: 580 Ohm
Lead Channel Pacing Threshold Amplitude: 0.375 V
Lead Channel Pacing Threshold Amplitude: 0.625 V
Lead Channel Setting Pacing Amplitude: 2.5 V
Lead Channel Setting Sensing Sensitivity: 4 mV
MDC IDC LEAD IMPLANT DT: 20150826
MDC IDC LEAD LOCATION: 753860
MDC IDC MSMT LEADCHNL RA PACING THRESHOLD PULSEWIDTH: 0.4 ms
MDC IDC MSMT LEADCHNL RV PACING THRESHOLD PULSEWIDTH: 0.4 ms
MDC IDC SET LEADCHNL RA PACING AMPLITUDE: 2 V
MDC IDC SET LEADCHNL RV PACING PULSEWIDTH: 0.4 ms
MDC IDC STAT BRADY AP VS PERCENT: 82 %
MDC IDC STAT BRADY AS VS PERCENT: 18 %

## 2016-08-03 ENCOUNTER — Encounter: Payer: Self-pay | Admitting: Cardiology

## 2016-08-06 NOTE — Telephone Encounter (Signed)
Spoke to representative at [facility] and pt approved through 03/25/17 and shipment schedule to arrive at home tomorrow.   Spoke with patient and she is comfortable giving injection at home. Lab orders entered and place in mail as patient prefers to have labs drawn at PCP.

## 2016-08-22 DIAGNOSIS — E874 Mixed disorder of acid-base balance: Secondary | ICD-10-CM | POA: Diagnosis not present

## 2016-08-22 DIAGNOSIS — E784 Other hyperlipidemia: Secondary | ICD-10-CM | POA: Diagnosis not present

## 2016-09-06 DIAGNOSIS — H3581 Retinal edema: Secondary | ICD-10-CM | POA: Diagnosis not present

## 2016-09-06 DIAGNOSIS — H34812 Central retinal vein occlusion, left eye, with macular edema: Secondary | ICD-10-CM | POA: Diagnosis not present

## 2016-10-02 DIAGNOSIS — H401131 Primary open-angle glaucoma, bilateral, mild stage: Secondary | ICD-10-CM | POA: Diagnosis not present

## 2016-10-29 ENCOUNTER — Encounter: Payer: Medicare HMO | Admitting: *Deleted

## 2016-10-31 ENCOUNTER — Encounter: Payer: Self-pay | Admitting: Cardiology

## 2016-11-07 ENCOUNTER — Ambulatory Visit (INDEPENDENT_AMBULATORY_CARE_PROVIDER_SITE_OTHER): Payer: Medicare HMO | Admitting: *Deleted

## 2016-11-07 DIAGNOSIS — I495 Sick sinus syndrome: Secondary | ICD-10-CM

## 2016-11-07 DIAGNOSIS — I442 Atrioventricular block, complete: Secondary | ICD-10-CM

## 2016-11-07 NOTE — Progress Notes (Signed)
Remote pacemaker check. 

## 2016-11-08 LAB — CUP PACEART REMOTE DEVICE CHECK
Battery Impedance: 160 Ohm
Battery Voltage: 2.79 V
Brady Statistic AP VS Percent: 82 %
Brady Statistic AS VP Percent: 0 %
Brady Statistic AS VS Percent: 18 %
Date Time Interrogation Session: 20180815140344
Implantable Lead Implant Date: 20150826
Implantable Lead Location: 753859
Implantable Lead Location: 753860
Implantable Lead Model: 5076
Implantable Lead Model: 5076
Lead Channel Impedance Value: 607 Ohm
Lead Channel Pacing Threshold Amplitude: 0.375 V
Lead Channel Pacing Threshold Pulse Width: 0.4 ms
Lead Channel Setting Pacing Amplitude: 2 V
Lead Channel Setting Pacing Amplitude: 2.5 V
Lead Channel Setting Pacing Pulse Width: 0.4 ms
MDC IDC LEAD IMPLANT DT: 20150826
MDC IDC MSMT BATTERY REMAINING LONGEVITY: 127 mo
MDC IDC MSMT LEADCHNL RA IMPEDANCE VALUE: 395 Ohm
MDC IDC MSMT LEADCHNL RV PACING THRESHOLD AMPLITUDE: 0.625 V
MDC IDC MSMT LEADCHNL RV PACING THRESHOLD PULSEWIDTH: 0.4 ms
MDC IDC PG IMPLANT DT: 20150826
MDC IDC SET LEADCHNL RV SENSING SENSITIVITY: 4 mV
MDC IDC STAT BRADY AP VP PERCENT: 0 %

## 2016-11-20 ENCOUNTER — Encounter: Payer: Self-pay | Admitting: Cardiology

## 2016-11-20 DIAGNOSIS — Z23 Encounter for immunization: Secondary | ICD-10-CM | POA: Diagnosis not present

## 2016-12-07 ENCOUNTER — Other Ambulatory Visit: Payer: Self-pay | Admitting: Internal Medicine

## 2016-12-12 DIAGNOSIS — Z7289 Other problems related to lifestyle: Secondary | ICD-10-CM | POA: Diagnosis not present

## 2016-12-12 DIAGNOSIS — H6983 Other specified disorders of Eustachian tube, bilateral: Secondary | ICD-10-CM | POA: Insufficient documentation

## 2016-12-12 DIAGNOSIS — H6123 Impacted cerumen, bilateral: Secondary | ICD-10-CM | POA: Diagnosis not present

## 2016-12-12 DIAGNOSIS — H6993 Unspecified Eustachian tube disorder, bilateral: Secondary | ICD-10-CM | POA: Insufficient documentation

## 2016-12-12 DIAGNOSIS — J302 Other seasonal allergic rhinitis: Secondary | ICD-10-CM | POA: Diagnosis not present

## 2016-12-12 DIAGNOSIS — J310 Chronic rhinitis: Secondary | ICD-10-CM | POA: Insufficient documentation

## 2016-12-19 DIAGNOSIS — H40013 Open angle with borderline findings, low risk, bilateral: Secondary | ICD-10-CM | POA: Diagnosis not present

## 2016-12-19 DIAGNOSIS — H2513 Age-related nuclear cataract, bilateral: Secondary | ICD-10-CM | POA: Diagnosis not present

## 2017-01-07 ENCOUNTER — Other Ambulatory Visit: Payer: Self-pay | Admitting: Internal Medicine

## 2017-01-17 DIAGNOSIS — H2511 Age-related nuclear cataract, right eye: Secondary | ICD-10-CM | POA: Diagnosis not present

## 2017-01-17 DIAGNOSIS — H25811 Combined forms of age-related cataract, right eye: Secondary | ICD-10-CM | POA: Diagnosis not present

## 2017-02-01 ENCOUNTER — Other Ambulatory Visit: Payer: Self-pay | Admitting: *Deleted

## 2017-02-06 ENCOUNTER — Telehealth: Payer: Self-pay | Admitting: Cardiology

## 2017-02-06 ENCOUNTER — Encounter: Payer: Medicare HMO | Admitting: *Deleted

## 2017-02-06 NOTE — Telephone Encounter (Signed)
LMOVM reminding pt to send remote transmission.   

## 2017-02-07 ENCOUNTER — Telehealth: Payer: Self-pay | Admitting: Internal Medicine

## 2017-02-07 NOTE — Telephone Encounter (Signed)
Noted  

## 2017-02-07 NOTE — Telephone Encounter (Signed)
New message    Patient calling to let staff know she is unable to send transmission today, will send remote transmission on 02/08/17.

## 2017-02-21 DIAGNOSIS — H34812 Central retinal vein occlusion, left eye, with macular edema: Secondary | ICD-10-CM | POA: Diagnosis not present

## 2017-02-21 DIAGNOSIS — H25812 Combined forms of age-related cataract, left eye: Secondary | ICD-10-CM | POA: Insufficient documentation

## 2017-02-21 DIAGNOSIS — H3581 Retinal edema: Secondary | ICD-10-CM | POA: Diagnosis not present

## 2017-02-21 DIAGNOSIS — Z961 Presence of intraocular lens: Secondary | ICD-10-CM | POA: Diagnosis not present

## 2017-02-21 DIAGNOSIS — H40003 Preglaucoma, unspecified, bilateral: Secondary | ICD-10-CM | POA: Diagnosis not present

## 2017-03-01 ENCOUNTER — Ambulatory Visit (INDEPENDENT_AMBULATORY_CARE_PROVIDER_SITE_OTHER): Payer: Medicare HMO | Admitting: *Deleted

## 2017-03-01 DIAGNOSIS — I495 Sick sinus syndrome: Secondary | ICD-10-CM

## 2017-03-04 NOTE — Progress Notes (Signed)
Remote pacemaker transmission.   

## 2017-03-06 LAB — CUP PACEART REMOTE DEVICE CHECK
Brady Statistic AP VP Percent: 0 %
Brady Statistic AP VS Percent: 82 %
Brady Statistic AS VS Percent: 18 %
Date Time Interrogation Session: 20181207141702
Implantable Lead Implant Date: 20150826
Lead Channel Impedance Value: 623 Ohm
Lead Channel Pacing Threshold Amplitude: 0.625 V
Lead Channel Pacing Threshold Pulse Width: 0.4 ms
Lead Channel Setting Sensing Sensitivity: 5.6 mV
MDC IDC LEAD IMPLANT DT: 20150826
MDC IDC LEAD LOCATION: 753859
MDC IDC LEAD LOCATION: 753860
MDC IDC MSMT BATTERY IMPEDANCE: 160 Ohm
MDC IDC MSMT BATTERY REMAINING LONGEVITY: 126 mo
MDC IDC MSMT BATTERY VOLTAGE: 2.79 V
MDC IDC MSMT LEADCHNL RA IMPEDANCE VALUE: 405 Ohm
MDC IDC MSMT LEADCHNL RA PACING THRESHOLD AMPLITUDE: 0.375 V
MDC IDC MSMT LEADCHNL RV PACING THRESHOLD PULSEWIDTH: 0.4 ms
MDC IDC PG IMPLANT DT: 20150826
MDC IDC SET LEADCHNL RA PACING AMPLITUDE: 2 V
MDC IDC SET LEADCHNL RV PACING AMPLITUDE: 2.5 V
MDC IDC SET LEADCHNL RV PACING PULSEWIDTH: 0.4 ms
MDC IDC STAT BRADY AS VP PERCENT: 0 %

## 2017-03-08 ENCOUNTER — Encounter: Payer: Self-pay | Admitting: Cardiology

## 2017-03-11 ENCOUNTER — Telehealth: Payer: Self-pay | Admitting: Internal Medicine

## 2017-03-11 NOTE — Telephone Encounter (Signed)
F/u Message ° °Pt returning RN call. Please call back to discuss  °

## 2017-03-11 NOTE — Telephone Encounter (Signed)
Returned call to patient. She states she has been feeling tired. She was seen by her PCP about 2 weeks ago. She was prescribed z-pak. Patient states she missed her last OV with Dr. Harrington Challenger and is scheduled to see Dr. Harrington Challenger on 03/29/17. Patient states she feels okay to wait till her OV, if she feels worse she will call back. Patient verbalized understanding and thanked me for the call.

## 2017-03-11 NOTE — Telephone Encounter (Signed)
Patient calling, states that she has a dry cough and fatigue. Patient thinks that it may be in relation to her heart.   Patient scheduled for 03-29-17 @2 :20pm

## 2017-03-11 NOTE — Telephone Encounter (Signed)
Returned call to patient. She states she is on a long distance call with family and would call back.

## 2017-03-15 ENCOUNTER — Telehealth: Payer: Self-pay | Admitting: Pharmacist

## 2017-03-15 NOTE — Telephone Encounter (Signed)
Called pt to follow up with Repatha tolerability and to schedule lab work. Pt reports she took 3 injections of Repatha and experienced flu-like symptoms with 3 injections. Pt is also intolerant to Livalo 2mg  3x per week (toe cramping), atorvastatin 10mg  four times a week and two times a week, Pitavastatin 4mg , Crestor, Zetia, Zocor, Pravachol - muscle aches and cramps in legs; Lipitor caused cramping.  Discussed CLEAR trial with bempedoic acid given previous intolerances to statins and Repatha. Pt is interested in learning more.  Will forward to research group to reach out to patient - she prefers to be reached in the first or second week of January after the holidays are over.  Will also forward to Dr Harrington Challenger as an Juluis Rainier.

## 2017-03-20 ENCOUNTER — Telehealth: Payer: Self-pay | Admitting: Internal Medicine

## 2017-03-20 NOTE — Telephone Encounter (Signed)
Left message for patient to call back  

## 2017-03-20 NOTE — Telephone Encounter (Signed)
Called and spoke to pt and scheduled her for an appt with MW tomorrow, 12/27 at 11:00. Pt expressed understanding. Nothing further needed.

## 2017-03-20 NOTE — Telephone Encounter (Signed)
Patient returning call, CB is [phone].  If cannot reach try, [phone].

## 2017-03-21 ENCOUNTER — Encounter: Payer: Self-pay | Admitting: Internal Medicine

## 2017-03-21 ENCOUNTER — Ambulatory Visit: Payer: Medicare HMO | Admitting: Internal Medicine

## 2017-03-21 ENCOUNTER — Encounter: Payer: Medicare HMO | Admitting: Internal Medicine

## 2017-03-21 ENCOUNTER — Ambulatory Visit (INDEPENDENT_AMBULATORY_CARE_PROVIDER_SITE_OTHER)
Admission: RE | Admit: 2017-03-21 | Discharge: 2017-03-21 | Disposition: A | Discharge status: Home / Self Care | Payer: Medicare HMO | Source: Ambulatory Visit | Attending: Internal Medicine | Admitting: Internal Medicine

## 2017-03-21 VITALS — BP 120/82 | HR 64 | Ht 64.0 in | Wt 180.2 lb

## 2017-03-21 DIAGNOSIS — R0602 Shortness of breath: Secondary | ICD-10-CM | POA: Diagnosis not present

## 2017-03-21 DIAGNOSIS — R058 Other specified cough: Secondary | ICD-10-CM

## 2017-03-21 DIAGNOSIS — R05 Cough: Secondary | ICD-10-CM

## 2017-03-21 MED ORDER — METHYLPREDNISOLONE ACETATE 80 MG/ML IJ SUSP
120.0000 mg | Freq: Once | INTRAMUSCULAR | Status: AC
  Administered 2017-03-21: 120 mg via INTRAMUSCULAR

## 2017-03-21 MED ORDER — FLUTICASONE FUROATE-VILANTEROL 100-25 MCG/INH IN AEPB: 1.0000 | INHALATION_SPRAY | Freq: Every day | RESPIRATORY_TRACT | 0 refills | Status: DC

## 2017-03-21 NOTE — Progress Notes (Signed)
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 Use this instead of Asmanex Ok to continue gargles, Vick's and other symptom remedies as you find helpful Ok to use Delsym otc cough med if needed  End of November 2018 uri > zpak / pred from [facility] better but still had cough  03/21/2017 acute extended ov/Wert re:  Refractory cough x 4 weeks back on BREO s improvement  Chief Complaint  Patient presents with  . Acute Visit    coughing (non-productive), lethargy, sinus congestion, left ear pain, care giver   03/14/17 head and chest congestion and sore throat/ L ear pain p got wet rx mucinex fast max / nasal saline  Lots of clear nasal  drainage but mostly day  > noct   No obvious day to day or daytime variability or assoc excess/ purulent sputum or mucus plugs or hemoptysis or cp or overt   hb symptoms. No unusual exposure hx or h/o childhood pna/ asthma or knowledge of premature birth.  Sleeping ok flat without nocturnal  or early am exacerbation  of respiratory  c/o's or need for noct saba. Also denies any obvious fluctuation of symptoms with weather or environmental changes or other aggravating or alleviating factors except as outlined above   Current Allergies, Complete Past Medical History, Past Surgical History, Family History, and Social History were reviewed in [employer] record.  ROS  The following are not active complaints unless bolded Hoarseness, sore throat, dysphagia, dental problems, itching, sneezing,  nasal congestion or discharge of excess mucus or purulent secretions, ear ache,   fever, chills, sweats, unintended wt loss or wt gain, classically pleuritic or exertional cp,  orthopnea pnd or leg swelling,  presyncope, palpitations, abdominal pain, anorexia, nausea, vomiting, diarrhea  or change in bowel habits or change in bladder habits, change in stools or change in urine, dysuria, hematuria,  rash, arthralgias, visual complaints, headache, numbness, weakness or ataxia or problems with walking or coordination,  change in mood/affect or memory.        Current Meds  Medication Sig  . Alum Hydroxide-Mag Carbonate (GAVISCON PO) Take 1 tablet by mouth daily as needed (heartburn).  Marland Kitchen atenolol (TENORMIN) 25 MG tablet TAKE 1 TABLET BY MOUTH EVERY MORNING NEED APPT  . bisacodyl (DULCOLAX) 5 MG EC tablet Take 5 mg by mouth daily as needed for moderate constipation.  . calcium carbonate (TUMS - DOSED IN MG ELEMENTAL CALCIUM) 500 MG chewable tablet Chew 2 tablets by mouth daily as needed for indigestion or heartburn.  . clonazePAM (KLONOPIN) 0.5 MG tablet Take 1 tablet by mouth 2 (two) times daily as needed for anxiety.  . diphenhydrAMINE (BENADRYL) 25 MG tablet Take 25 mg by mouth at bedtime as needed for allergies.  . fish oil-omega-3 fatty acids 1000 MG capsule Take 1 g by mouth every Monday, Wednesday, and Friday.   . losartan (COZAAR) 25 MG tablet Take 12.5-25 mg by mouth 2 (two) times daily. She takes half a tablet in the morning and a whole tablet daily after supper.  . Multiple Vitamin (MULTIVITAMIN) tablet Take 1 tablet by mouth daily.  Marland Kitchen NITROSTAT 0.4 MG SL tablet PLACE 1 TABLET (  0.4 MG TOTAL) UNDER THE TONGUE EVERY 5 (FIVE) MINUTES AS NEEDED FOR CHEST PAIN.  Marland Kitchen Polyethyl Glycol-Propyl Glycol (SYSTANE) 0.4-0.3 % SOLN Place 1 drop into both eyes daily as needed (For dry eyes.).                  Review of Systems     Objective:   Physical Exam Hoarse amb  wf with classic pseudowheeze   Wt Readings from Last 3 Encounters:  03/21/17 180 lb 3.2 oz (81.7 kg)  05/03/16 175 lb 1.9 oz (79.4 kg)  01/17/16 174 lb 12.8 oz (79.3 kg)     Vital signs reviewed - Note on arrival 02 sats  97% on  RA   HEENT: nl dentition, turbinates bilaterally, and oropharynx. Nl external ear canals without cough reflex   NECK :  without JVD/Nodes/TM/ nl carotid upstrokes bilaterally   LUNGS: no acc muscle use,  Nl contour chest which is clear to A and P bilaterally without cough on insp or exp maneuvers   CV:  RRR  no s3 or murmur or increase in P2, and no edema   ABD:  soft and nontender with nl inspiratory excursion in the supine position. No bruits or organomegaly appreciated, bowel sounds nl  MS:  Nl gait/ ext warm without deformities, calf tenderness, cyanosis or clubbing No obvious joint restrictions   SKIN: warm and dry without lesions    NEURO:  alert, approp, nl sensorium with  no motor or cerebellar deficits apparent.    CXR PA and Lateral:   03/21/2017 :    I personally reviewed images and agree with radiology impression as follows:   No active cardiopulmonary disease.         Assessment:

## 2017-03-21 NOTE — Patient Instructions (Addendum)
For cough > Mucinex 600 mg up to 2 every 12 hours   For drainage / throat tickle try take CHLORPHENIRAMINE  4 mg - take one every 4 hours as needed - available over the counter- may cause drowsiness so start with just a bedtime dose or two and see how you tolerate it before trying in daytime    Depomedrol 120 mg IM   Try prilosec otc 20mg   Take 30-60 min before first meal of the day and Pepcid ac (famotidine) 20 mg one @  bedtime until cough is completely gone for at least a week without the need for cough suppression    GERD (REFLUX)  is an extremely common cause of respiratory symptoms just like yours , many times with no obvious heartburn at all.    It can be treated with medication, but also with lifestyle changes including elevation of the head of your bed (ideally with 6 inch  bed blocks),  Smoking cessation, avoidance of late meals, excessive alcohol, and avoid fatty foods, chocolate, peppermint, colas, red wine, and acidic juices such as orange juice.  NO MINT OR MENTHOL PRODUCTS SO NO COUGH DROPS   USE SUGARLESS CANDY INSTEAD (Jolley ranchers or Stover's or Life Savers) or even ice chips will also do - the key is to swallow to prevent all throat clearing. NO OIL BASED VITAMINS - use powdered substitutes.    Please remember to go to the  x-ray department downstairs in the basement  for your tests - we will call you with the results when they are available.  Pulmonary follow up is as needed  Late Add: make sure she's not taking BREO or fish oil at this point and if not better needs ov with all meds in hand w/in a week to See Tammy NP, Dr Annamaria Boots or me

## 2017-03-22 ENCOUNTER — Telehealth: Payer: Self-pay | Admitting: Internal Medicine

## 2017-03-22 NOTE — Telephone Encounter (Signed)
Per 12.27.18 result note: Result Notes for DG Chest 2 View  Notes recorded by Tanda Rockers, MD on 03/22/2017 at 4:56 AM EST Call pt:  Reviewed cxr and no acute change so no change in recommendations made at [facility] spoke with patient and discussed cxr results/recs as stated by MW Pt voiced her understanding and denied any questions/concerns  Patient did ask that message be relayed to MW that she "really enjoyed" her visit with MW yesterday and "truly appreciated" the insight and new perspective that he offered her on her condition.    Will sign and route to [facility] and MW as Juluis Rainier

## 2017-03-22 NOTE — Progress Notes (Signed)
Patient aware per 03/22/17 phone note

## 2017-03-25 DIAGNOSIS — R0989 Other specified symptoms and signs involving the circulatory and respiratory systems: Secondary | ICD-10-CM | POA: Diagnosis not present

## 2017-03-26 ENCOUNTER — Encounter: Payer: Self-pay | Admitting: Internal Medicine

## 2017-03-26 NOTE — Assessment & Plan Note (Addendum)
.The most common causes of chronic cough in immunocompetent adults include the following: upper airway cough syndrome (UACS), previously referred to as postnasal drip syndrome (PNDS), which is caused by variety of rhinosinus conditions; (2) asthma; (3) GERD; (4) chronic bronchitis from cigarette smoking or other inhaled environmental irritants; (5) nonasthmatic eosinophilic bronchitis; and (6) bronchiectasis.   These conditions, singly or in combination, have accounted for up to 94% of the causes of chronic cough in prospective studies.   Other conditions have constituted no >6% of the causes in prospective studies These have included bronchogenic carcinoma, chronic interstitial pneumonia, sarcoidosis, left ventricular failure, ACEI-induced cough, and aspiration from a condition associated with pharyngeal dysfunction.    Chronic cough is often simultaneously caused by more than one condition. A single cause has been found from 38 to 82% of the time, multiple causes from 18 to 62%. Multiply caused cough has been the result of three diseases up to 42% of the time.      Of the three most common causes of  Sub-acute or recurrent or chronic cough, only one (GERD)  can actually contribute to/ trigger  the other two (asthma and post nasal drip syndrome)  and perpetuate the cylce of cough.  While not intuitively obvious, many patients with chronic low grade reflux do not cough until there is a primary insult that disturbs the protective epithelial barrier and exposes sensitive nerve endings.   This is typically viral as likely was the case here originally but can be direct physical injury such as with an endotracheal tube.   The point is that once this occurs, it is difficult to eliminate the cycle  using anything but a maximally effective acid suppression regimen at least in the short run, accompanied by an appropriate diet to address non acid GERD .   rec max rx for gerd plus .1st gen H1 blockers per  guidelines  And depomedrol 120 IM but no need for pulmonary rx / f/u can be prn if cough fails to respond with sinus CT / allergy profile next ov if not improved.  Avoid inhalers here as all of her issues appear to be upper airway in nature = uacs  Upper airway cough syndrome (previously labeled PNDS),  is so named because it's frequently impossible to sort out how much is  CR/sinusitis with freq throat clearing (which can be related to primary GERD)   vs  causing  secondary (" extra esophageal")  GERD from wide swings in gastric pressure that occur with throat clearing, often  promoting self use of mint and menthol lozenges that reduce the lower esophageal sphincter tone and exacerbate the problem further in a cyclical fashion.   These are the same pts (now being labeled as having "irritable larynx syndrome" by some cough centers) who not infrequently have a history of having failed to tolerate ace inhibitors,  dry powder inhalers(like BREO which she was instructed to stop)  or biphosphonates or report having atypical/extraesophageal reflux symptoms that don't respond to standard doses of PPI  and are easily confused as having aecopd or asthma flares by even experienced allergists/ pulmonologists (myself included).     I had an extended discussion with the patient reviewing all relevant studies completed to date and  lasting 25 minutes of a 40  minute acute office visit in pt new to me    re  severe non-specific but potentially very serious refractory respiratory symptoms of uncertain and potentially multiple  etiologies.   Each maintenance medication  was reviewed in detail including most importantly the difference between maintenance and prns and under what circumstances the prns are to be triggered using an action plan format that is not reflected in the computer generated alphabetically organized AVS.    Please see AVS for specific instructions unique to this office visit that I personally wrote and  verbalized to the the pt in detail and then reviewed with pt  by my nurse highlighting any changes in therapy/plan of care  recommended at today's visit.

## 2017-03-27 ENCOUNTER — Telehealth: Payer: Self-pay | Admitting: *Deleted

## 2017-03-27 NOTE — Telephone Encounter (Signed)
Patient returning call, CB is [phone].

## 2017-03-27 NOTE — Telephone Encounter (Signed)
-----   Message from Tanda Rockers, MD sent at 03/26/2017  8:36 AM EST ----- make sure she's not taking BREO or fish oil at this point and if not better needs ov with all meds in hand w/in a week to See Tammy NP, Dr Annamaria Boots or me

## 2017-03-27 NOTE — Telephone Encounter (Signed)
Spoke with patient regarding cough that has worsen Advised pt MW recommendations, pt verbalized understanding will be stopping Breo today Scheduled follow up appt for Monday 04/01/2017 at 11:30am, advised pt to bring all medications with her to appt  Nothing further needed

## 2017-03-27 NOTE — Telephone Encounter (Signed)
LMTCB

## 2017-03-29 ENCOUNTER — Encounter: Payer: Self-pay | Admitting: Internal Medicine

## 2017-03-29 ENCOUNTER — Telehealth: Payer: Self-pay | Admitting: *Deleted

## 2017-03-29 ENCOUNTER — Ambulatory Visit: Payer: Medicare HMO | Admitting: Internal Medicine

## 2017-03-29 VITALS — BP 122/88 | HR 84 | Ht 64.0 in | Wt 173.6 lb

## 2017-03-29 DIAGNOSIS — I2510 Atherosclerotic heart disease of native coronary artery without angina pectoris: Secondary | ICD-10-CM | POA: Diagnosis not present

## 2017-03-29 DIAGNOSIS — I10 Essential (primary) hypertension: Secondary | ICD-10-CM | POA: Diagnosis not present

## 2017-03-29 DIAGNOSIS — E785 Hyperlipidemia, unspecified: Secondary | ICD-10-CM | POA: Diagnosis not present

## 2017-03-29 DIAGNOSIS — I442 Atrioventricular block, complete: Secondary | ICD-10-CM

## 2017-03-29 MED ORDER — EZETIMIBE 10 MG PO TABS: 10.0000 mg | ORAL_TABLET | Freq: Every day | ORAL | 6 refills | Status: DC

## 2017-03-29 NOTE — Telephone Encounter (Signed)
Spoke with patient regarding the Clear Research Study.  She is not interested in the study due to the fact it is active drug vs a placebo.  Would only be interested if guaranteed she is on active drug.  However, she is willing to be kept on a database to be contacted regarding other studies that might be of interest to her. She stated she is getting ready to initiate Zetia after her visit to see Dr. Harrington Challenger.

## 2017-03-29 NOTE — Progress Notes (Signed)
Cardiology Office Note   Date:  03/29/2017   ID:  Madison [name], DOB [birth year], MRN [id number]  PCP:  Crist Infante, MD  Cardiologist:   Dorris Carnes, MD   F/U of CAD  And HTn      History of Present Illness: Madison [name] is a 84 y.o. female with a history of mild to mod CAD  Echo with normal LVEF  Myvoew 2015 normal  Cardiopulm stress test ? Small vessel dz vs deconditioning.   Pt is has a PPM  I saw her in clinic in May 2017   Since I saw her she has been seen by Beckie Salts  In October    The pt qualified for Repatha but did not tolerate it   Pt remains busy  Denies CP  Breathing is stable     Current Meds  Medication Sig  . Alum Hydroxide-Mag Carbonate (GAVISCON PO) Take 1 tablet by mouth daily as needed (heartburn).  Marland Kitchen atenolol (TENORMIN) 25 MG tablet TAKE 1 TABLET BY MOUTH EVERY MORNING NEED APPT  . bisacodyl (DULCOLAX) 5 MG EC tablet Take 5 mg by mouth daily as needed for moderate constipation.  . calcium carbonate (TUMS - DOSED IN MG ELEMENTAL CALCIUM) 500 MG chewable tablet Chew 2 tablets by mouth daily as needed for indigestion or heartburn.  . clonazePAM (KLONOPIN) 0.5 MG tablet Take 1 tablet by mouth 2 (two) times daily as needed for anxiety.  . diphenhydrAMINE (BENADRYL) 25 MG tablet Take 25 mg by mouth at bedtime as needed for allergies.  Marland Kitchen losartan (COZAAR) 25 MG tablet Take 12.5-25 mg by mouth 2 (two) times daily. She takes half a tablet in the morning and a whole tablet daily after supper.  . Multiple Vitamin (MULTIVITAMIN) tablet Take 1 tablet by mouth daily.  Marland Kitchen NITROSTAT 0.4 MG SL tablet PLACE 1 TABLET (0.4 MG TOTAL) UNDER THE TONGUE EVERY 5 (FIVE) MINUTES AS NEEDED FOR CHEST PAIN.  Marland Kitchen Polyethyl Glycol-Propyl Glycol (SYSTANE) 0.4-0.3 % SOLN Place 1 drop into both eyes daily as needed (For dry eyes.).  Marland Kitchen polyethylene glycol (MIRALAX / GLYCOLAX) packet Take 17 g by mouth 2 (two) times daily.     Allergies:   Tape; Codeine; Epinephrine; Other;  Oxycodone-acetaminophen; Penicillins; Percodan [oxycodone-aspirin]; Pravastatin; Repatha [evolocumab]; and Vancomycin   Past Medical History:  Diagnosis Date  . Acquired absence of breast and nipple   . Acute myocardial infarction, unspecified site, episode of care unspecified    1965 and 2015   . ALLERGIC RHINITIS   . Anginal pain ([place])   . Arthritis   . Asthma   . Blindness of left eye    decreased vision in left eye related to ocular occlusion   . Breast cancer ([place])    left mastectomy  . Complication of anesthesia   . Coronary atherosclerosis   . Cough   . Dysfunction of eustachian tube   . Esophageal reflux   . Heart murmur   . Hemorrhoids   . Hyperlipidemia   . Hypertension   . Peripheral vascular disease ([place])   . Pneumonia    hx of walking pneumonia x 2   . PONV (postoperative nausea and vomiting)   . Presence of permanent cardiac pacemaker   . PVC's (premature ventricular contractions)   . Shortness of breath   . Stress incontinence   . Syncope 11/17/2013    Past Surgical History:  Procedure Laterality Date  . ABDOMINAL HYSTERECTOMY  1967  . CARDIAC CATHETERIZATION  03/01/1986   normal coronaries (Dr. Domenic Moras)  . CARDIAC CATHETERIZATION  10/25/2002   normal L main; LAD w/40% narrowing in prox 3rd and 60-70% narrowing beyond 1st diagonal, LAD was tortuous; dominant RCA with 30-40% segmental narrowing and 20-30% narrowing at junction of prox 3rd (Dr. Marella Chimes)  . CARDIAC CATHETERIZATION  04/11/2006   trivial luminal irregularities in coronaries and mid LAD 50% (Dr. Domenic Moras)  . CARDIOPULMONARY MET TEST  05/05/2012   excellent effort w/RER 1.06, peak VO2>100%, peak HR 81%, good functional capacity  . CAROTID DOPPLER  2005   normal study (ordered for swelling & pain, left neck clavicle to ear)  . DILATION AND CURETTAGE OF UTERUS  1962-1968   x4  . GALLBLADDER SURGERY  1985  . MASTECTOMY Left 1981  . MYOMECTOMY  1968  . NM MYOCAR PERF WALL MOTION  2012    bruce myoview -no inducible ischemia, EF 71%, low risk scan  . PERMANENT PACEMAKER INSERTION N/A 11/18/2013   Procedure: PERMANENT PACEMAKER INSERTION;  Surgeon: Evans Lance, MD;  Location: [facility] LAB;  Service: Cardiovascular;  Laterality: N/A;  . PLACEMENT OF BREAST IMPLANTS  1993   [facility]  . REMOVAL OF BILATERAL TISSUE EXPANDERS WITH PLACEMENT OF BILATERAL BREAST IMPLANTS    . TOTAL HIP ARTHROPLASTY Right 08/02/2015   Procedure: RIGHT TOTAL HIP ARTHROPLASTY ANTERIOR APPROACH;  Surgeon: Paralee Cancel, MD;  Location: [facility] ORS;  Service: Orthopedics;  Laterality: Right;  . TRANSTHORACIC ECHOCARDIOGRAM  2014   EF 16-38%, grade 1 diastolic dysfunction; mildly thickened MV leaflets, trivial regurg      Social History:  The patient  reports that  has never smoked. she has never used smokeless tobacco. She reports that she drinks alcohol. She reports that she does not use drugs.   Family History:  The patient's family history includes CAD in her brother and brother; Cancer in her brother, maternal grandfather, mother, and sister; Cervical cancer in her sister; Colon cancer in her sister; Colon cancer (age of onset: 32) in her mother; Heart attack in her brother; Heart disease in her maternal grandfather, maternal grandmother, mother, and paternal grandmother; Liver cancer in her brother; Lymphoma in her sister; Stroke in her brother, brother, maternal grandmother, mother, and paternal grandfather.    ROS:  Please see the history of present illness. All other systems are reviewed and  Negative to the above problem except as noted.    PHYSICAL EXAM: VS:  BP 122/88   Pulse 84   Ht 5' 4"  (1.626 m)   Wt 173 lb 9.6 oz (78.7 kg)   SpO2 98%   BMI 29.80 kg/m   GEN: Well nourished, well developed, in no acute distress  HEENT: normal  Neck: no JVD, carotid bruits, or masses Cardiac: RRR; no murmurs, rubs, or gallops,no edema  Respiratory:  clear to auscultation bilaterally, normal work of breathing GI:  soft, nontender, nondistended, + BS  No hepatomegaly  MS: no deformity Moving all extremities   Skin: warm and dry, no rash Neuro:  Strength and sensation are intact Psych: euthymic mood, full affect   EKG:  EKG is not  ordered today.   Lipid Panel    Component Value Date/Time   CHOL 176 10/28/2014 0440   TRIG 126 10/28/2014 0440   HDL 42 10/28/2014 0440   CHOLHDL 4.2 10/28/2014 0440   VLDL 25 10/28/2014 0440   LDLCALC 109 (H) 10/28/2014 0440      Wt Readings from Last 3 Encounters:  03/29/17  173 lb 9.6 oz (78.7 kg)  03/21/17 180 lb 3.2 oz (81.7 kg)  05/03/16 175 lb 1.9 oz (79.4 kg)      ASSESSMENT AND PLAN:  1  CAD  No symptoms of angina  Encouraged her to stay active   2  HTN  Diastolic 88   Follow    3  HL Pt did not tolerate statins or repatha.  She is not sure if she tried Zetia  Will start and f/u lipids in 8 wks    4  PPM   Follws with Beckie Salts       Current medicines are reviewed at length with the patient today.  The patient does not have concerns regarding medicines.  Signed, Dorris Carnes, MD  03/29/2017 3:03 PM    [facility] [place], [place], [place]  [address] Phone: [phone]; Fax: [phone]

## 2017-03-29 NOTE — Patient Instructions (Signed)
Your physician has recommended you make the following change in your medication:  1.) ZETIA 10 MG --TAKE ONE TABLET ONCE A DAY FOR CHOLESTEROL  Your physician recommends that you return for lab work in: [place].  CALL ME IF COST IS TOO HIGH, WE CAN COMPARE WITH [facility].

## 2017-04-01 ENCOUNTER — Encounter: Payer: Self-pay | Admitting: Internal Medicine

## 2017-04-01 ENCOUNTER — Ambulatory Visit (INDEPENDENT_AMBULATORY_CARE_PROVIDER_SITE_OTHER): Payer: PPO | Admitting: Internal Medicine

## 2017-04-01 VITALS — BP 112/74 | HR 69 | Ht 64.0 in | Wt 171.0 lb

## 2017-04-01 DIAGNOSIS — R058 Other specified cough: Secondary | ICD-10-CM

## 2017-04-01 DIAGNOSIS — R05 Cough: Secondary | ICD-10-CM

## 2017-04-01 NOTE — Patient Instructions (Addendum)
After a week all better without the need for cough medication ok to reduce prilosec back to what your were doing   For cough as needed > delsym    At the next flare of respiratory symptoms > change the reflux regimen to Prilosec  40 mg   Take  30-60 min before first meal of the day and Pepcid (famotidine)  20 mg one @  bedtime until return to office - this is the best way to tell whether stomach acid is contributing to your problem.      If you are satisfied with your treatment plan,  let your doctor know and he/she can either refill your medications or you can return here when your prescription runs out.     If in any way you are not 100% satisfied,  please tell us.  If 100% better, tell your friends!  Pulmonary follow up is as needed

## 2017-04-01 NOTE — Progress Notes (Signed)
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 Use this instead of Asmanex Ok to continue gargles, Vick's and other symptom remedies as you find helpful Ok to use Delsym otc cough med if needed  End of November 2018 uri > zpak / pred from [facility] better but still had cough   03/21/2017 acute extended ov/Massiah Longanecker re:  Refractory cough x 4 weeks back on BREO s improvement  Chief Complaint  Patient presents with  . Acute Visit    coughing (non-productive), lethargy, sinus congestion, left ear pain, care giver   03/14/17 head and chest congestion and sore throat/ L ear pain p got wet rx mucinex fast max / nasal saline  Lots of clear nasal  drainage but mostly day  > noct  rec For cough > Mucinex 600 mg up to 2 every 12 hours  For drainage / throat tickle try take CHLORPHENIRAMINE  4 mg - take one every 4 hours as needed - available over the counter- may cause drowsiness so start with just a bedtime dose or two and see how you tolerate it before trying in daytime   Depomedrol 120 mg IM  Try prilosec otc 20mg   Take 30-60 min before first meal of the day and Pepcid ac (famotidine) 20 mg one @  bedtime until cough is completely gone for at least a week without the need for cough suppression GERD  Diet Late Add: make sure she's not taking BREO or fish oil at this point and if not better needs ov with all meds in hand w/in a week to See Tammy NP, Dr Annamaria Boots or me    03/27/17 stopped Breo  03/31/17 stopped Prednisone    04/01/2017  f/u ov/Jermany Rimel re: cough much better so far  For Almost 2 years did fine s resp symptoms or the need for resp medication Now on prilosec 40 mg x 30 min before meals whereas prev taking it once  Day Not limited by breathing from  desired activities      No obvious day to day or daytime variability or assoc excess/ purulent sputum or mucus plugs or hemoptysis or cp or chest tightness, subjective wheeze or overt sinus or hb symptoms. No unusual exposure hx or h/o childhood pna/ asthma or knowledge of premature birth.  Sleeping ok flat without nocturnal  or early am exacerbation  of respiratory  c/o's or need for noct saba. Also denies any obvious fluctuation of symptoms with weather or environmental changes or other aggravating or alleviating factors except as outlined above   Current Allergies, Complete Past Medical History, Past Surgical History, Family History, and Social History were reviewed in [employer] record.  ROS  The following are not active complaints unless bolded Hoarseness, sore throat, dysphagia, dental problems, itching, sneezing,  nasal congestion or discharge of excess mucus or purulent secretions, ear ache,   fever, chills, sweats, unintended wt loss or wt gain, classically pleuritic or exertional cp,  orthopnea pnd or leg swelling, presyncope, palpitations, abdominal pain, anorexia, nausea, vomiting, diarrhea  or change in bowel habits or change in bladder habits, change in stools or change in urine, dysuria, hematuria,  rash, arthralgias, visual  complaints, headache, numbness, weakness or ataxia or problems with walking or coordination,  change in mood/affect or memory.        Current Meds  Medication Sig  . atenolol (TENORMIN) 25 MG tablet TAKE 1 TABLET BY MOUTH EVERY MORNING NEED APPT  . Biotin w/ Vitamins C & E (HAIR/SKIN/NAILS) 1250-7.5-7.5 MCG-MG-UNT CHEW Chew by mouth daily.  . bisacodyl (DULCOLAX) 5 MG EC tablet Take 5 mg by mouth daily as needed for moderate constipation.  . clonazePAM (KLONOPIN) 0.5 MG tablet Take 1 tablet by mouth 2 (two) times daily as needed for anxiety.  . diphenhydrAMINE (BENADRYL) 25 MG tablet Take 25 mg by mouth at bedtime as needed for allergies.   Marland Kitchen dorzolamide (TRUSOPT) 2 % ophthalmic solution Place 1 drop into both eyes 2 (two) times daily.  . fluticasone (FLONASE) 50 MCG/ACT nasal spray Place 1 spray into both nostrils daily as needed for allergies or rhinitis.  Marland Kitchen guaiFENesin (MUCINEX) 600 MG 12 hr tablet Take 600 mg by mouth 2 (two) times daily.  Marland Kitchen losartan (COZAAR) 25 MG tablet Take 25 mg by mouth daily.  . Multiple Vitamin (MULTIVITAMIN) tablet Take 1 tablet by mouth daily.  Marland Kitchen NITROSTAT 0.4 MG SL tablet PLACE 1 TABLET (0.4 MG TOTAL) UNDER THE TONGUE EVERY 5 (FIVE) MINUTES AS NEEDED FOR CHEST PAIN.  Marland Kitchen omeprazole (PRILOSEC) 40 MG capsule Take 40 mg by mouth daily.  Vladimir Faster Glycol-Propyl Glycol (SYSTANE) 0.4-0.3 % SOLN Place 1 drop into both eyes daily as needed (For dry eyes.).                             Objective:   Physical Exam   amb wf minimally hoarse  04/01/2017          171   03/21/17 180 lb 3.2 oz (81.7 kg)  05/03/16 175 lb 1.9 oz (79.4 kg)  01/17/16 174 lb 12.8 oz (79.3 kg)     Vital signs reviewed - Note on arrival 02 sats  97% on RA   HEENT: nl dentition, turbinates bilaterally, and oropharynx. Nl external ear canals without cough reflex   NECK :  without JVD/Nodes/TM/ nl carotid upstrokes bilaterally   LUNGS: no acc muscle use,  Nl contour chest which is clear to A and P bilaterally without cough on insp or exp maneuvers   CV:  RRR  no s3 or murmur or increase in P2, and no edema   ABD:  soft and nontender with nl inspiratory excursion in the supine position. No bruits or organomegaly appreciated, bowel sounds nl  MS:  Nl gait/ ext warm without deformities, calf tenderness, cyanosis or clubbing No obvious joint restrictions   SKIN: warm and dry without lesions    NEURO:  alert, approp, nl sensorium with  no motor or cerebellar deficits apparent.        .         Assessment:

## 2017-04-03 ENCOUNTER — Encounter: Payer: Self-pay | Admitting: Internal Medicine

## 2017-04-03 NOTE — Assessment & Plan Note (Signed)
Resolved for now p likely cyclical trigger like uri/ gerd and no evidence of active asthma at this point so rec  Max rx for gerd in setting of any flare of non-specific resp symptoms but esp cough/ maintain gerd diet long term   No need for any inhalers from my perspective nor regular pulmonary f/u but happy to see prn    Each maintenance medication was reviewed in detail including most importantly the difference between maintenance and as needed and under what circumstances the prns are to be used.  Please see AVS for specific  Instructions which are unique to this visit and I personally typed out  which were reviewed in detail in writing with the patient and a copy provided.

## 2017-04-08 ENCOUNTER — Encounter: Payer: Medicare HMO | Admitting: Internal Medicine

## 2017-05-13 ENCOUNTER — Other Ambulatory Visit: Payer: Self-pay

## 2017-05-13 MED ORDER — ATENOLOL 25 MG PO TABS: ORAL_TABLET | ORAL | 10 refills | Status: DC

## 2017-05-16 ENCOUNTER — Ambulatory Visit: Payer: PPO | Admitting: Internal Medicine

## 2017-05-16 ENCOUNTER — Encounter: Payer: Self-pay | Admitting: Internal Medicine

## 2017-05-16 VITALS — BP 134/83 | HR 86 | Ht 63.5 in | Wt 172.0 lb

## 2017-05-16 DIAGNOSIS — R82998 Other abnormal findings in urine: Secondary | ICD-10-CM | POA: Diagnosis not present

## 2017-05-16 DIAGNOSIS — Z950 Presence of cardiac pacemaker: Secondary | ICD-10-CM

## 2017-05-16 DIAGNOSIS — E538 Deficiency of other specified B group vitamins: Secondary | ICD-10-CM | POA: Diagnosis not present

## 2017-05-16 DIAGNOSIS — E7849 Other hyperlipidemia: Secondary | ICD-10-CM | POA: Diagnosis not present

## 2017-05-16 DIAGNOSIS — I10 Essential (primary) hypertension: Secondary | ICD-10-CM

## 2017-05-16 DIAGNOSIS — I495 Sick sinus syndrome: Secondary | ICD-10-CM

## 2017-05-16 DIAGNOSIS — M859 Disorder of bone density and structure, unspecified: Secondary | ICD-10-CM | POA: Diagnosis not present

## 2017-05-16 LAB — CUP PACEART INCLINIC DEVICE CHECK
Battery Remaining Longevity: 126 mo
Battery Voltage: 2.79 V
Brady Statistic AP VP Percent: 0 %
Brady Statistic AP VS Percent: 82 %
Date Time Interrogation Session: 20190221123347
Implantable Lead Implant Date: 20150826
Implantable Lead Location: 753860
Implantable Pulse Generator Implant Date: 20150826
Lead Channel Impedance Value: 385 Ohm
Lead Channel Impedance Value: 601 Ohm
Lead Channel Pacing Threshold Amplitude: 0.375 V
Lead Channel Pacing Threshold Amplitude: 0.625 V
Lead Channel Pacing Threshold Pulse Width: 0.4 ms
Lead Channel Setting Pacing Pulse Width: 0.4 ms
Lead Channel Setting Sensing Sensitivity: 5.6 mV
MDC IDC LEAD IMPLANT DT: 20150826
MDC IDC LEAD LOCATION: 753859
MDC IDC MSMT BATTERY IMPEDANCE: 160 Ohm
MDC IDC MSMT LEADCHNL RA PACING THRESHOLD AMPLITUDE: 0.5 V
MDC IDC MSMT LEADCHNL RA PACING THRESHOLD PULSEWIDTH: 0.4 ms
MDC IDC MSMT LEADCHNL RA SENSING INTR AMPL: 2 mV
MDC IDC MSMT LEADCHNL RV PACING THRESHOLD AMPLITUDE: 0.75 V
MDC IDC MSMT LEADCHNL RV PACING THRESHOLD PULSEWIDTH: 0.4 ms
MDC IDC MSMT LEADCHNL RV PACING THRESHOLD PULSEWIDTH: 0.4 ms
MDC IDC MSMT LEADCHNL RV SENSING INTR AMPL: 11.2 mV
MDC IDC SET LEADCHNL RA PACING AMPLITUDE: 2 V
MDC IDC SET LEADCHNL RV PACING AMPLITUDE: 2.5 V
MDC IDC STAT BRADY AS VP PERCENT: 0 %
MDC IDC STAT BRADY AS VS PERCENT: 18 %

## 2017-05-16 NOTE — Progress Notes (Signed)
HPI Madison [name] returns today for followup. She is a pleasant 84 yo woman with symptomatic sinus node dysfunction and heart block who underwent PPM insertion approx. 2 years ago. In the interim, she has done well with no chest pain or sob. She has not had any palpitations or syncope.  She has been stressed out by her husbands having to have 2 different surgeries on his back and is now in rehab. He has developed C. Diff.  Allergies  Allergen Reactions  . Tape Other (See Comments)    PAPER TAPE -Raw, itching, bleeding skin  . Codeine Nausea And Vomiting and Other (See Comments)  . Epinephrine Other (See Comments)    Enhances longevity of Novocaine - severe heart palpatations   . Other Nausea And Vomiting    Madison [name]  . Oxycodone-Acetaminophen     Other reaction(s): Dizziness (intolerance)  . Penicillins Other (See Comments)    Heart palpitations Has patient had a PCN reaction causing immediate rash, facial/tongue/throat swelling, SOB or lightheadedness with hypotension: no Has patient had a PCN reaction causing severe rash involving mucus membranes or skin necrosis: no Has patient had a PCN reaction that required hospitalization yes - caused her to get a heart catheterization Has patient had a PCN reaction occurring within the last 10 years: about 22 years ago If all of the above answers are "NO", then may proceed with C  . Percodan [Oxycodone-Aspirin] Nausea And Vomiting  . Pravastatin Other (See Comments)    Statins cause cramps  . Repatha [Evolocumab]     Flu like symptoms  . Vancomycin Hives     Current Outpatient Medications  Medication Sig Dispense Refill  . atenolol (TENORMIN) 25 MG tablet TAKE 1 TABLET BY MOUTH EVERY MORNING 30 tablet 10  . Biotin w/ Vitamins C & E (HAIR/SKIN/NAILS) 1250-7.5-7.5 MCG-MG-UNT CHEW Chew by mouth daily.    . bisacodyl (DULCOLAX) 5 MG EC tablet Take 5 mg by mouth daily as needed for moderate constipation.    . clonazePAM (KLONOPIN) 0.5 MG  tablet Take 1 tablet by mouth 2 (two) times daily as needed for anxiety.  2  . diphenhydrAMINE (BENADRYL) 25 MG tablet Take 25 mg by mouth at bedtime as needed for allergies.    Marland Kitchen dorzolamide (TRUSOPT) 2 % ophthalmic solution Place 1 drop into both eyes 2 (two) times daily.    Marland Kitchen ezetimibe (ZETIA) 10 MG tablet Take 1 tablet (10 mg total) by mouth daily. 30 tablet 6  . fluticasone (FLONASE) 50 MCG/ACT nasal spray Place 1 spray into both nostrils daily as needed for allergies or rhinitis.    Marland Kitchen guaiFENesin (MUCINEX) 600 MG 12 hr tablet Take 600 mg by mouth 2 (two) times daily.    Marland Kitchen losartan (COZAAR) 25 MG tablet Take 25 mg by mouth daily.    . Multiple Vitamin (MULTIVITAMIN) tablet Take 1 tablet by mouth daily.    Marland Kitchen NITROSTAT 0.4 MG SL tablet PLACE 1 TABLET (0.4 MG TOTAL) UNDER THE TONGUE EVERY 5 (FIVE) MINUTES AS NEEDED FOR CHEST PAIN. 25 tablet 0  . omeprazole (PRILOSEC) 40 MG capsule Take 40 mg by mouth daily.    Vladimir Faster Glycol-Propyl Glycol (SYSTANE) 0.4-0.3 % SOLN Place 1 drop into both eyes daily as needed (For dry eyes.).     No current facility-administered medications for this visit.      Past Medical History:  Diagnosis Date  . Acquired absence of breast and nipple   . Acute myocardial infarction, unspecified  site, episode of care unspecified    1965 and 2015   . ALLERGIC RHINITIS   . Anginal pain ([place])   . Arthritis   . Asthma   . Blindness of left eye    decreased vision in left eye related to ocular occlusion   . Breast cancer ([place])    left mastectomy  . Complication of anesthesia   . Coronary atherosclerosis   . Cough   . Dysfunction of eustachian tube   . Esophageal reflux   . Heart murmur   . Hemorrhoids   . Hyperlipidemia   . Hypertension   . Peripheral vascular disease ([place])   . Pneumonia    hx of walking pneumonia x 2   . PONV (postoperative nausea and vomiting)   . Presence of permanent cardiac pacemaker   . PVC's (premature ventricular contractions)     . Shortness of breath   . Stress incontinence   . Syncope 11/17/2013    ROS:   All systems reviewed and negative except as noted in the HPI.   Past Surgical History:  Procedure Laterality Date  . ABDOMINAL HYSTERECTOMY  1967  . CARDIAC CATHETERIZATION  03/01/1986   normal coronaries (Dr. Domenic Moras)  . CARDIAC CATHETERIZATION  10/25/2002   normal L main; LAD w/40% narrowing in prox 3rd and 60-70% narrowing beyond 1st diagonal, LAD was tortuous; dominant RCA with 30-40% segmental narrowing and 20-30% narrowing at junction of prox 3rd (Dr. Marella Chimes)  . CARDIAC CATHETERIZATION  04/11/2006   trivial luminal irregularities in coronaries and mid LAD 50% (Dr. Domenic Moras)  . CARDIOPULMONARY MET TEST  05/05/2012   excellent effort w/RER 1.06, peak VO2>100%, peak HR 81%, good functional capacity  . CAROTID DOPPLER  2005   normal study (ordered for swelling & pain, left neck clavicle to ear)  . DILATION AND CURETTAGE OF UTERUS  1962-1968   x4  . GALLBLADDER SURGERY  1985  . MASTECTOMY Left 1981  . MYOMECTOMY  1968  . NM MYOCAR PERF WALL MOTION  2012   bruce myoview -no inducible ischemia, EF 71%, low risk scan  . PERMANENT PACEMAKER INSERTION N/A 11/18/2013   Procedure: PERMANENT PACEMAKER INSERTION;  Surgeon: Evans Lance, MD;  Location[facility] C[facility] LAB;  Service: Cardiovascular;  Laterality: N/A;  . PLACEMENT OF BREAST IMPLANTS  1993   [facility]  . REMOVAL OF BILATERAL TISSUE EXPANDERS WITH PLACEMENT OF BILATERAL BREAST IMPLANTS    . TOTAL HIP ARTHROPLASTY Right 08/02/2015   Procedure: RIGHT TOTAL HIP ARTHROPLASTY ANTERIOR APPROACH;  Surgeon: Paralee Cancel, MD;  Location: [facility] ORS;  Service: Orthopedics;  Laterality: Right;  . TRANSTHORACIC ECHOCARDIOGRAM  2014   EF 94-17%, grade 1 diastolic dysfunction; mildly thickened MV leaflets, trivial regurg      Family History  Problem Relation Age of Onset  . Heart attack Brother   . Stroke Brother   . Lymphoma Sister   . Colon cancer Sister   .  Heart disease Mother   . Stroke Mother   . Colon cancer Mother 77  . Cancer Mother   . CAD Brother        + stents  . CAD Brother        + stents  . Stroke Brother   . Cervical cancer Sister   . Liver cancer Brother   . Heart disease Maternal Grandmother   . Stroke Maternal Grandmother   . Heart disease Maternal Grandfather   . Cancer Maternal Grandfather   . Heart disease  Paternal Grandmother   . Stroke Paternal Grandfather   . Cancer Sister   . Cancer Brother      Social History   Socioeconomic History  . Marital status: Married    Spouse name: Not on file  . Number of children: 1  . Years of education: Not on file  . Highest education level: Not on file  Social Needs  . Financial resource strain: Not on file  . Food insecurity - worry: Not on file  . Food insecurity - inability: Not on file  . Transportation needs - medical: Not on file  . Transportation needs - non-medical: Not on file  Occupational History  . 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 Smoking status: Never Smoker  . Smokeless tobacco: Never Used  Substance and Sexual Activity  . Alcohol use: Yes    Comment: 2 glasses of wine with dinner occasional   . Drug use: No  . Sexual activity: Not on file  Other Topics Concern  . Not on file  Social History Narrative  . Not on file     BP 134/83   Pulse 86   Ht 5' 3.5" (1.613 m)   Wt 172 lb (78 kg)   SpO2 97%   BMI 29.99 kg/m   Physical Exam:  overweight but well appearing NAD HEENT: Unremarkable Neck:  6 cm JVD, no thyromegally Lymphatics:  No adenopathy Back:  No CVA tenderness Lungs:  Clear with no wheezes HEART:  Regular rate rhythm, no murmurs, no rubs, no clicks Abd:  soft, positive bowel sounds, no organomegally, no rebound, no guarding Ext:  2 plus pulses, no edema, no cyanosis, no clubbing Skin:  No rashes no nodules Neuro:  CN II through XII intact, motor grossly intact  DEVICE  Normal device  function.  See PaceArt for details.   Assess/Plan: 1. Sinus node dysfunction - she is asymptomatic, s/p PPM insertion.  2. PPM - her Medtronic DDD PM is working normally. Will recheck in several months. 3. HTN - her blood pressure today is fairly well controlled. No change in meds.  Mikle Bosworth.D.

## 2017-05-16 NOTE — Patient Instructions (Signed)
Medication Instructions:  Your physician recommends that you continue on your current medications as directed. Please refer to the Current Medication list given to you today.  Labwork: None ordered.  Testing/Procedures: None ordered.  Follow-Up: Your physician wants you to follow-up in: one year with Dr. Lovena Le.   You will receive a reminder letter in the mail two months in advance. If you don't receive a letter, please call our office to schedule the follow-up appointment.  Remote monitoring is used to monitor your Pacemaker from home. This monitoring reduces the number of office visits required to check your device to one time per year. It allows [place] to keep an eye on the functioning of your device to ensure it is working properly. You are scheduled for a device check from home on 06/03/2017. You may send your transmission at any time that day. If you have a wireless device, the transmission will be sent automatically. After your physician reviews your transmission, you will receive a postcard with your next transmission date.  Any Other Special Instructions Will Be Listed Below (If Applicable).  If you need a refill on your cardiac medications before your next appointment, please call your pharmacy.

## 2017-05-17 DIAGNOSIS — R7301 Impaired fasting glucose: Secondary | ICD-10-CM | POA: Diagnosis not present

## 2017-05-20 DIAGNOSIS — Z1231 Encounter for screening mammogram for malignant neoplasm of breast: Secondary | ICD-10-CM | POA: Diagnosis not present

## 2017-05-22 DIAGNOSIS — M1611 Unilateral primary osteoarthritis, right hip: Secondary | ICD-10-CM | POA: Diagnosis not present

## 2017-05-22 DIAGNOSIS — K635 Polyp of colon: Secondary | ICD-10-CM | POA: Diagnosis not present

## 2017-05-22 DIAGNOSIS — J45998 Other asthma: Secondary | ICD-10-CM | POA: Diagnosis not present

## 2017-05-22 DIAGNOSIS — R51 Headache: Secondary | ICD-10-CM | POA: Diagnosis not present

## 2017-05-22 DIAGNOSIS — M6740 Ganglion, unspecified site: Secondary | ICD-10-CM | POA: Diagnosis not present

## 2017-05-22 DIAGNOSIS — Z0000 Encounter for general adult medical examination without abnormal findings: Secondary | ICD-10-CM | POA: Diagnosis not present

## 2017-05-22 DIAGNOSIS — Z6830 Body mass index (BMI) 30.0-30.9, adult: Secondary | ICD-10-CM | POA: Diagnosis not present

## 2017-05-22 DIAGNOSIS — I872 Venous insufficiency (chronic) (peripheral): Secondary | ICD-10-CM | POA: Diagnosis not present

## 2017-05-22 DIAGNOSIS — Z1389 Encounter for screening for other disorder: Secondary | ICD-10-CM | POA: Diagnosis not present

## 2017-05-22 DIAGNOSIS — Z950 Presence of cardiac pacemaker: Secondary | ICD-10-CM | POA: Diagnosis not present

## 2017-05-22 DIAGNOSIS — I2510 Atherosclerotic heart disease of native coronary artery without angina pectoris: Secondary | ICD-10-CM | POA: Diagnosis not present

## 2017-05-22 DIAGNOSIS — H348122 Central retinal vein occlusion, left eye, stable: Secondary | ICD-10-CM | POA: Diagnosis not present

## 2017-05-29 DIAGNOSIS — Z1212 Encounter for screening for malignant neoplasm of rectum: Secondary | ICD-10-CM | POA: Diagnosis not present

## 2017-06-03 ENCOUNTER — Telehealth: Payer: Self-pay | Admitting: Cardiology

## 2017-06-03 ENCOUNTER — Encounter: Payer: PPO | Admitting: *Deleted

## 2017-06-03 NOTE — Telephone Encounter (Signed)
Spoke with pt and reminded pt of remote transmission that is due today. Pt verbalized understanding and stated that she is going to refuse this remote appt b/c she just seen MD on 05-22-17.

## 2017-07-11 DIAGNOSIS — H401131 Primary open-angle glaucoma, bilateral, mild stage: Secondary | ICD-10-CM | POA: Diagnosis not present

## 2017-07-11 DIAGNOSIS — H34812 Central retinal vein occlusion, left eye, with macular edema: Secondary | ICD-10-CM | POA: Diagnosis not present

## 2017-07-11 DIAGNOSIS — H35362 Drusen (degenerative) of macula, left eye: Secondary | ICD-10-CM | POA: Diagnosis not present

## 2017-07-11 DIAGNOSIS — H25812 Combined forms of age-related cataract, left eye: Secondary | ICD-10-CM | POA: Diagnosis not present

## 2017-07-11 DIAGNOSIS — Z961 Presence of intraocular lens: Secondary | ICD-10-CM | POA: Diagnosis not present

## 2017-08-23 DIAGNOSIS — M859 Disorder of bone density and structure, unspecified: Secondary | ICD-10-CM | POA: Diagnosis not present

## 2017-08-23 DIAGNOSIS — H401131 Primary open-angle glaucoma, bilateral, mild stage: Secondary | ICD-10-CM | POA: Diagnosis not present

## 2017-09-02 ENCOUNTER — Telehealth: Payer: Self-pay

## 2017-09-02 ENCOUNTER — Ambulatory Visit (INDEPENDENT_AMBULATORY_CARE_PROVIDER_SITE_OTHER): Payer: PPO | Admitting: *Deleted

## 2017-09-02 DIAGNOSIS — I495 Sick sinus syndrome: Secondary | ICD-10-CM | POA: Diagnosis not present

## 2017-09-02 NOTE — Telephone Encounter (Signed)
LMOVM reminding pt to send remote transmission.   

## 2017-09-04 ENCOUNTER — Encounter: Payer: Self-pay | Admitting: Cardiology

## 2017-09-05 ENCOUNTER — Encounter: Payer: Self-pay | Admitting: Cardiology

## 2017-09-05 NOTE — Progress Notes (Signed)
Remote pacemaker transmission.   

## 2017-09-06 LAB — CUP PACEART REMOTE DEVICE CHECK
Battery Impedance: 208 Ohm
Battery Remaining Longevity: 117 mo
Brady Statistic AP VP Percent: 0 %
Implantable Lead Implant Date: 20150826
Implantable Lead Model: 5076
Implantable Lead Model: 5076
Implantable Pulse Generator Implant Date: 20150826
Lead Channel Impedance Value: 375 Ohm
Lead Channel Pacing Threshold Amplitude: 0.375 V
Lead Channel Pacing Threshold Pulse Width: 0.4 ms
Lead Channel Pacing Threshold Pulse Width: 0.4 ms
Lead Channel Setting Pacing Amplitude: 2 V
Lead Channel Setting Pacing Pulse Width: 0.4 ms
Lead Channel Setting Sensing Sensitivity: 5.6 mV
MDC IDC LEAD IMPLANT DT: 20150826
MDC IDC LEAD LOCATION: 753859
MDC IDC LEAD LOCATION: 753860
MDC IDC MSMT BATTERY VOLTAGE: 2.79 V
MDC IDC MSMT LEADCHNL RV IMPEDANCE VALUE: 636 Ohm
MDC IDC MSMT LEADCHNL RV PACING THRESHOLD AMPLITUDE: 0.625 V
MDC IDC MSMT LEADCHNL RV SENSING INTR AMPL: 11.2 mV
MDC IDC SESS DTM: 20190612152651
MDC IDC SET LEADCHNL RV PACING AMPLITUDE: 2.5 V
MDC IDC STAT BRADY AP VS PERCENT: 82 %
MDC IDC STAT BRADY AS VP PERCENT: 0 %
MDC IDC STAT BRADY AS VS PERCENT: 18 %

## 2017-11-23 ENCOUNTER — Other Ambulatory Visit: Payer: Self-pay | Admitting: Internal Medicine

## 2017-11-28 DIAGNOSIS — L820 Inflamed seborrheic keratosis: Secondary | ICD-10-CM | POA: Diagnosis not present

## 2017-11-28 DIAGNOSIS — E538 Deficiency of other specified B group vitamins: Secondary | ICD-10-CM | POA: Diagnosis not present

## 2017-11-28 DIAGNOSIS — K635 Polyp of colon: Secondary | ICD-10-CM | POA: Diagnosis not present

## 2017-11-28 DIAGNOSIS — L821 Other seborrheic keratosis: Secondary | ICD-10-CM | POA: Diagnosis not present

## 2017-11-28 DIAGNOSIS — L814 Other melanin hyperpigmentation: Secondary | ICD-10-CM | POA: Diagnosis not present

## 2017-11-28 DIAGNOSIS — I2510 Atherosclerotic heart disease of native coronary artery without angina pectoris: Secondary | ICD-10-CM | POA: Diagnosis not present

## 2017-11-28 DIAGNOSIS — D2261 Melanocytic nevi of right upper limb, including shoulder: Secondary | ICD-10-CM | POA: Diagnosis not present

## 2017-11-28 DIAGNOSIS — D225 Melanocytic nevi of trunk: Secondary | ICD-10-CM | POA: Diagnosis not present

## 2017-11-28 DIAGNOSIS — D2262 Melanocytic nevi of left upper limb, including shoulder: Secondary | ICD-10-CM | POA: Diagnosis not present

## 2017-11-28 DIAGNOSIS — R7301 Impaired fasting glucose: Secondary | ICD-10-CM | POA: Diagnosis not present

## 2017-11-28 DIAGNOSIS — J45998 Other asthma: Secondary | ICD-10-CM | POA: Diagnosis not present

## 2017-11-28 DIAGNOSIS — Z6829 Body mass index (BMI) 29.0-29.9, adult: Secondary | ICD-10-CM | POA: Diagnosis not present

## 2017-11-28 DIAGNOSIS — I10 Essential (primary) hypertension: Secondary | ICD-10-CM | POA: Diagnosis not present

## 2017-11-28 DIAGNOSIS — Z950 Presence of cardiac pacemaker: Secondary | ICD-10-CM | POA: Diagnosis not present

## 2017-11-28 DIAGNOSIS — L720 Epidermal cyst: Secondary | ICD-10-CM | POA: Diagnosis not present

## 2017-11-28 DIAGNOSIS — D2272 Melanocytic nevi of left lower limb, including hip: Secondary | ICD-10-CM | POA: Diagnosis not present

## 2017-12-02 ENCOUNTER — Encounter: Payer: PPO | Admitting: *Deleted

## 2017-12-02 ENCOUNTER — Telehealth: Payer: Self-pay

## 2017-12-02 NOTE — Telephone Encounter (Signed)
Attempted to confirm remote transmission with pt. No answer and was unable to leave a message.   

## 2017-12-03 ENCOUNTER — Encounter: Payer: Self-pay | Admitting: Cardiology

## 2017-12-31 ENCOUNTER — Ambulatory Visit (INDEPENDENT_AMBULATORY_CARE_PROVIDER_SITE_OTHER): Payer: PPO | Admitting: *Deleted

## 2017-12-31 DIAGNOSIS — I495 Sick sinus syndrome: Secondary | ICD-10-CM | POA: Diagnosis not present

## 2017-12-31 DIAGNOSIS — I442 Atrioventricular block, complete: Secondary | ICD-10-CM

## 2018-01-01 NOTE — Progress Notes (Signed)
Remote pacemaker transmission.   

## 2018-01-06 ENCOUNTER — Other Ambulatory Visit: Payer: Self-pay | Admitting: Internal Medicine

## 2018-01-06 MED ORDER — EZETIMIBE 10 MG PO TABS: 10.0000 mg | ORAL_TABLET | Freq: Every day | ORAL | 0 refills | Status: DC

## 2018-01-08 ENCOUNTER — Encounter: Payer: Self-pay | Admitting: Cardiology

## 2018-01-13 DIAGNOSIS — Z23 Encounter for immunization: Secondary | ICD-10-CM | POA: Diagnosis not present

## 2018-01-23 LAB — CUP PACEART REMOTE DEVICE CHECK
Battery Impedance: 208 Ohm
Battery Remaining Longevity: 116 mo
Brady Statistic AP VP Percent: 0 %
Brady Statistic AP VS Percent: 84 %
Brady Statistic AS VS Percent: 16 %
Implantable Lead Implant Date: 20150826
Implantable Lead Location: 753859
Implantable Lead Model: 5076
Implantable Pulse Generator Implant Date: 20150826
Lead Channel Impedance Value: 375 Ohm
Lead Channel Impedance Value: 593 Ohm
Lead Channel Pacing Threshold Amplitude: 0.625 V
Lead Channel Pacing Threshold Pulse Width: 0.4 ms
Lead Channel Setting Pacing Amplitude: 2.5 V
Lead Channel Setting Pacing Pulse Width: 0.4 ms
Lead Channel Setting Sensing Sensitivity: 5.6 mV
MDC IDC LEAD IMPLANT DT: 20150826
MDC IDC LEAD LOCATION: 753860
MDC IDC MSMT BATTERY VOLTAGE: 2.79 V
MDC IDC MSMT LEADCHNL RA PACING THRESHOLD AMPLITUDE: 0.375 V
MDC IDC MSMT LEADCHNL RV PACING THRESHOLD PULSEWIDTH: 0.4 ms
MDC IDC SESS DTM: 20191008131250
MDC IDC SET LEADCHNL RA PACING AMPLITUDE: 2 V
MDC IDC STAT BRADY AS VP PERCENT: 0 %

## 2018-02-07 DIAGNOSIS — H401131 Primary open-angle glaucoma, bilateral, mild stage: Secondary | ICD-10-CM | POA: Diagnosis not present

## 2018-02-13 DIAGNOSIS — H401131 Primary open-angle glaucoma, bilateral, mild stage: Secondary | ICD-10-CM | POA: Diagnosis not present

## 2018-02-13 DIAGNOSIS — H34812 Central retinal vein occlusion, left eye, with macular edema: Secondary | ICD-10-CM | POA: Diagnosis not present

## 2018-02-13 DIAGNOSIS — H25812 Combined forms of age-related cataract, left eye: Secondary | ICD-10-CM | POA: Diagnosis not present

## 2018-02-13 DIAGNOSIS — Z961 Presence of intraocular lens: Secondary | ICD-10-CM | POA: Diagnosis not present

## 2018-04-01 ENCOUNTER — Ambulatory Visit (INDEPENDENT_AMBULATORY_CARE_PROVIDER_SITE_OTHER): Payer: PPO

## 2018-04-01 DIAGNOSIS — I495 Sick sinus syndrome: Secondary | ICD-10-CM | POA: Diagnosis not present

## 2018-04-02 NOTE — Progress Notes (Signed)
Remote pacemaker transmission.   

## 2018-04-03 LAB — CUP PACEART REMOTE DEVICE CHECK
Battery Remaining Longevity: 114 mo
Battery Voltage: 2.79 V
Implantable Lead Implant Date: 20150826
Implantable Lead Location: 753860
Implantable Lead Model: 5076
Implantable Lead Model: 5076
Implantable Pulse Generator Implant Date: 20150826
Lead Channel Impedance Value: 389 Ohm
Lead Channel Pacing Threshold Amplitude: 0.375 V
Lead Channel Pacing Threshold Amplitude: 0.625 V
Lead Channel Pacing Threshold Pulse Width: 0.4 ms
Lead Channel Setting Pacing Amplitude: 2 V
Lead Channel Setting Pacing Pulse Width: 0.4 ms
Lead Channel Setting Sensing Sensitivity: 4 mV
MDC IDC LEAD IMPLANT DT: 20150826
MDC IDC LEAD LOCATION: 753859
MDC IDC MSMT BATTERY IMPEDANCE: 233 Ohm
MDC IDC MSMT LEADCHNL RA PACING THRESHOLD PULSEWIDTH: 0.4 ms
MDC IDC MSMT LEADCHNL RV IMPEDANCE VALUE: 619 Ohm
MDC IDC SESS DTM: 20200107140739
MDC IDC SET LEADCHNL RV PACING AMPLITUDE: 2.5 V
MDC IDC STAT BRADY AP VP PERCENT: 0 %
MDC IDC STAT BRADY AP VS PERCENT: 84 %
MDC IDC STAT BRADY AS VP PERCENT: 0 %
MDC IDC STAT BRADY AS VS PERCENT: 16 %

## 2018-04-10 DIAGNOSIS — J09X2 Influenza due to identified novel influenza A virus with other respiratory manifestations: Secondary | ICD-10-CM | POA: Diagnosis not present

## 2018-04-10 DIAGNOSIS — R05 Cough: Secondary | ICD-10-CM | POA: Diagnosis not present

## 2018-04-10 DIAGNOSIS — Z6828 Body mass index (BMI) 28.0-28.9, adult: Secondary | ICD-10-CM | POA: Diagnosis not present

## 2018-04-10 DIAGNOSIS — I10 Essential (primary) hypertension: Secondary | ICD-10-CM | POA: Diagnosis not present

## 2018-04-10 DIAGNOSIS — N183 Chronic kidney disease, stage 3 (moderate): Secondary | ICD-10-CM | POA: Diagnosis not present

## 2018-04-10 DIAGNOSIS — J45909 Unspecified asthma, uncomplicated: Secondary | ICD-10-CM | POA: Diagnosis not present

## 2018-04-10 DIAGNOSIS — J302 Other seasonal allergic rhinitis: Secondary | ICD-10-CM | POA: Diagnosis not present

## 2018-04-17 DIAGNOSIS — J09X2 Influenza due to identified novel influenza A virus with other respiratory manifestations: Secondary | ICD-10-CM | POA: Diagnosis not present

## 2018-04-17 DIAGNOSIS — J45909 Unspecified asthma, uncomplicated: Secondary | ICD-10-CM | POA: Diagnosis not present

## 2018-04-17 DIAGNOSIS — R05 Cough: Secondary | ICD-10-CM | POA: Diagnosis not present

## 2018-04-17 DIAGNOSIS — K219 Gastro-esophageal reflux disease without esophagitis: Secondary | ICD-10-CM | POA: Diagnosis not present

## 2018-04-17 DIAGNOSIS — I10 Essential (primary) hypertension: Secondary | ICD-10-CM | POA: Diagnosis not present

## 2018-04-17 DIAGNOSIS — Z6828 Body mass index (BMI) 28.0-28.9, adult: Secondary | ICD-10-CM | POA: Diagnosis not present

## 2018-04-18 ENCOUNTER — Encounter: Payer: Self-pay | Admitting: Internal Medicine

## 2018-04-26 ENCOUNTER — Other Ambulatory Visit: Payer: Self-pay | Admitting: Internal Medicine

## 2018-05-09 ENCOUNTER — Encounter: Payer: Self-pay | Admitting: Internal Medicine

## 2018-05-09 ENCOUNTER — Ambulatory Visit: Payer: PPO | Admitting: Internal Medicine

## 2018-05-09 VITALS — BP 120/60 | HR 71 | Ht 63.5 in | Wt 170.0 lb

## 2018-05-09 DIAGNOSIS — I495 Sick sinus syndrome: Secondary | ICD-10-CM | POA: Diagnosis not present

## 2018-05-09 DIAGNOSIS — I10 Essential (primary) hypertension: Secondary | ICD-10-CM

## 2018-05-09 DIAGNOSIS — Z950 Presence of cardiac pacemaker: Secondary | ICD-10-CM

## 2018-05-09 NOTE — Progress Notes (Signed)
HPI Mrs. Jurek returns today for followup. She is a pleasant 85 yo woman with a h/o marked sinus node dysfunction, heart block, and HTN who presents today for followup. She is s/p PPM insertion in 2015. In the interim, she has moved to an independent living center. She denies chest pain or sob. No edema. Rare palpitations.  Allergies  Allergen Reactions  . Tape Other (See Comments)    PAPER TAPE -Raw, itching, bleeding skin  . Codeine Nausea And Vomiting and Other (See Comments)  . Latex     Other reaction(s): Other (See Comments) Other  . Other Nausea And Vomiting    [name]  . Oxycodone-Acetaminophen     Other reaction(s): Dizziness (intolerance) Other reaction(s): Dizziness (intolerance) Other reaction(s): Dizziness (intolerance)  . Penicillins Other (See Comments)    Heart palpitations Has patient had a PCN reaction causing immediate rash, facial/tongue/throat swelling, SOB or lightheadedness with hypotension: no Has patient had a PCN reaction causing severe rash involving mucus membranes or skin necrosis: no Has patient had a PCN reaction that required hospitalization yes - caused her to get a heart catheterization Has patient had a PCN reaction occurring within the last 10 years: about 22 years ago If all of the above answers are "NO", then may proceed with C  . Percodan [Oxycodone-Aspirin] Nausea And Vomiting  . Pravastatin Other (See Comments)    Statins cause cramps  . Repatha [Evolocumab]     Flu like symptoms  . Vancomycin Hives    Other reaction(s): Other (See Comments)  . Epinephrine Other (See Comments) and Palpitations    Enhances longevity of Novocaine - severe heart palpatations      Current Outpatient Medications  Medication Sig Dispense Refill  . atenolol (TENORMIN) 25 MG tablet TAKE 1 TABLET BY MOUTH EVERY MORNING 30 tablet 10  . Biotin w/ Vitamins C & E (HAIR/SKIN/NAILS) 1250-7.5-7.5 MCG-MG-UNT CHEW Chew by mouth daily.    . bisacodyl (DULCOLAX) 5  MG EC tablet Take 5 mg by mouth daily as needed for moderate constipation.    . clonazePAM (KLONOPIN) 0.5 MG tablet Take 1 tablet by mouth 2 (two) times daily as needed for anxiety.  2  . diphenhydrAMINE (BENADRYL) 25 MG tablet Take 25 mg by mouth at bedtime as needed for allergies.    Marland Kitchen dorzolamide (TRUSOPT) 2 % ophthalmic solution Place 1 drop into both eyes 2 (two) times daily.    Marland Kitchen ezetimibe (ZETIA) 10 MG tablet Take 1 tablet (10 mg total) by mouth daily. 15 tablet 0  . fluticasone (FLONASE) 50 MCG/ACT nasal spray Place 1 spray into both nostrils daily as needed for allergies or rhinitis.    Marland Kitchen guaiFENesin (MUCINEX) 600 MG 12 hr tablet Take 600 mg by mouth 2 (two) times daily.    Marland Kitchen losartan (COZAAR) 25 MG tablet Take 25 mg by mouth daily.    . Multiple Vitamin (MULTIVITAMIN) tablet Take 1 tablet by mouth daily.    Marland Kitchen NITROSTAT 0.4 MG SL tablet PLACE 1 TABLET (0.4 MG TOTAL) UNDER THE TONGUE EVERY 5 (FIVE) MINUTES AS NEEDED FOR CHEST PAIN. 25 tablet 0  . omeprazole (PRILOSEC) 40 MG capsule Take 40 mg by mouth daily.    Vladimir Faster Glycol-Propyl Glycol (SYSTANE) 0.4-0.3 % SOLN Place 1 drop into both eyes daily as needed (For dry eyes.).     No current facility-administered medications for this visit.      Past Medical History:  Diagnosis Date  . Acquired absence of  breast and nipple   . Acute myocardial infarction, unspecified site, episode of care unspecified    1965 and 2015   . ALLERGIC RHINITIS   . Anginal pain ([place])   . Arthritis   . Asthma   . Blindness of left eye    decreased vision in left eye related to ocular occlusion   . Breast cancer ([place])    left mastectomy  . Complication of anesthesia   . Coronary atherosclerosis   . Cough   . Dysfunction of eustachian tube   . Esophageal reflux   . Heart murmur   . Hemorrhoids   . Hyperlipidemia   . Hypertension   . Peripheral vascular disease ([place])   . Pneumonia    hx of walking pneumonia x 2   . PONV (postoperative nausea  and vomiting)   . Presence of permanent cardiac pacemaker   . PVC's (premature ventricular contractions)   . Shortness of breath   . Stress incontinence   . Syncope 11/17/2013    ROS:   All systems reviewed and negative except as noted in the HPI.   Past Surgical History:  Procedure Laterality Date  . ABDOMINAL HYSTERECTOMY  1967  . CARDIAC CATHETERIZATION  03/01/1986   normal coronaries (Dr. Domenic Moras)  . CARDIAC CATHETERIZATION  10/25/2002   normal L main; LAD w/40% narrowing in prox 3rd and 60-70% narrowing beyond 1st diagonal, LAD was tortuous; dominant RCA with 30-40% segmental narrowing and 20-30% narrowing at junction of prox 3rd (Dr. Marella Chimes)  . CARDIAC CATHETERIZATION  04/11/2006   trivial luminal irregularities in coronaries and mid LAD 50% (Dr. Domenic Moras)  . CARDIOPULMONARY MET TEST  05/05/2012   excellent effort w/RER 1.06, peak VO2>100%, peak HR 81%, good functional capacity  . CAROTID DOPPLER  2005   normal study (ordered for swelling & pain, left neck clavicle to ear)  . DILATION AND CURETTAGE OF UTERUS  1962-1968   x4  . GALLBLADDER SURGERY  1985  . MASTECTOMY Left 1981  . MYOMECTOMY  1968  . NM MYOCAR PERF WALL MOTION  2012   bruce myoview -no inducible ischemia, EF 71%, low risk scan  . PERMANENT PACEMAKER INSERTION N/A 11/18/2013   Procedure: PERMANENT PACEMAKER INSERTION;  Surgeon: Evans Lance, MD;  Location: [facility] CATH LAB;  Service: Cardiovascular;  Laterality: N/A;  . PLACEMENT OF BREAST IMPLANTS  1993   [facility]  . REMOVAL OF BILATERAL TISSUE EXPANDERS WITH PLACEMENT OF BILATERAL BREAST IMPLANTS    . TOTAL HIP ARTHROPLASTY Right 08/02/2015   Procedure: RIGHT TOTAL HIP ARTHROPLASTY ANTERIOR APPROACH;  Surgeon: Paralee Cancel, MD;  Location: [facility] ORS;  Service: Orthopedics;  Laterality: Right;  . TRANSTHORACIC ECHOCARDIOGRAM  2014   EF 17-61%, grade 1 diastolic dysfunction; mildly thickened MV leaflets, trivial regurg      Family History  Problem Relation Age  of Onset  . Heart attack Brother   . Stroke Brother   . Lymphoma Sister   . Colon cancer Sister   . Heart disease Mother   . Stroke Mother   . Colon cancer Mother 61  . Cancer Mother   . CAD Brother        + stents  . CAD Brother        + stents  . Stroke Brother   . Cervical cancer Sister   . Liver cancer Brother   . Heart disease Maternal Grandmother   . Stroke Maternal Grandmother   . Heart disease Maternal Grandfather   .  Cancer Maternal Grandfather   . Heart disease Paternal Grandmother   . Stroke Paternal Grandfather   . Cancer Sister   . Cancer Brother      Social History   Socioeconomic History  . Marital status: Married    Spouse name: Not on file  . Number of children: 1  . Years of education: Not on file  . Highest education level: Not on file  Occupational History  . Occupation: RETIRED-swim Tax adviser  Social Needs  . Financial resource strain: Not on file  . Food insecurity:    Worry: Not on file    Inability: Not on file  . Transportation needs:    Medical: Not on file    Non-medical: Not on file  Tobacco Use  . Smoking status: Never Smoker  . Smokeless tobacco: Never Used  Substance and Sexual Activity  . Alcohol use: Yes    Comment: 2 glasses of wine with dinner occasional   . Drug use: No  . Sexual activity: Not on file  Lifestyle  . Physical activity:    Days per week: Not on file    Minutes per session: Not on file  . Stress: Not on file  Relationships  . Social connections:    Talks on phone: Not on file    Gets together: Not on file    Attends religious service: Not on file    Active member of club or organization: Not on file    Attends meetings of clubs or organizations: Not on file    Relationship status: Not on file  . Intimate partner violence:    Fear of current or ex partner: Not on file    Emotionally abused: Not on file    Physically abused: Not on file    Forced sexual activity: Not on file  Other  Topics Concern  . Not on file  Social History Narrative  . Not on file     BP 120/60   Pulse 71   Ht 5' 3.5" (1.613 m)   Wt 170 lb (77.1 kg)   BMI 29.64 kg/m   Physical Exam:  Well appearing NAD HEENT: Unremarkable Neck:  No JVD, no thyromegally Lymphatics:  No adenopathy Back:  No CVA tenderness Lungs:  Clear HEART:  Regular rate rhythm, no murmurs, no rubs, no clicks Abd:  soft, positive bowel sounds, no organomegally, no rebound, no guarding Ext:  2 plus pulses, no edema, no cyanosis, no clubbing Skin:  No rashes no nodules Neuro:  CN II through XII intact, motor grossly intact  EKG - nsr with atrial pacing  DEVICE  Normal device function.  See PaceArt for details.   Assess/Plan: 1. Sinus node dysfunction - she is asymptomatic, s/p PPM insertion.  2. PPM - her medtronic DDD PM is working normally.  We will recheck in several months. 3. HTN - her blood pressure is well controlled. She is encouraged to lose weight and maintain a low sodium diet.  Mikle Bosworth.D.

## 2018-05-09 NOTE — Patient Instructions (Signed)

## 2018-05-12 ENCOUNTER — Other Ambulatory Visit (HOSPITAL_COMMUNITY): Payer: Self-pay | Admitting: Internal Medicine

## 2018-05-12 ENCOUNTER — Ambulatory Visit (HOSPITAL_COMMUNITY)
Admission: RE | Admit: 2018-05-12 | Discharge: 2018-05-12 | Disposition: A | Discharge status: Home / Self Care | Payer: PPO | Source: Ambulatory Visit | Attending: Family | Admitting: Family

## 2018-05-12 DIAGNOSIS — I6529 Occlusion and stenosis of unspecified carotid artery: Secondary | ICD-10-CM | POA: Diagnosis not present

## 2018-05-13 LAB — CUP PACEART INCLINIC DEVICE CHECK
Battery Impedance: 233 Ohm
Battery Remaining Longevity: 113 mo
Battery Voltage: 2.79 V
Brady Statistic AP VP Percent: 0 %
Brady Statistic AP VS Percent: 85 %
Brady Statistic AS VP Percent: 0 %
Brady Statistic AS VS Percent: 15 %
Date Time Interrogation Session: 20200214172849
Implantable Lead Implant Date: 20150826
Implantable Lead Implant Date: 20150826
Implantable Lead Location: 753859
Implantable Lead Location: 753860
Implantable Lead Model: 5076
Implantable Lead Model: 5076
Implantable Pulse Generator Implant Date: 20150826
Lead Channel Impedance Value: 385 Ohm
Lead Channel Pacing Threshold Amplitude: 0.375 V
Lead Channel Pacing Threshold Amplitude: 0.5 V
Lead Channel Pacing Threshold Amplitude: 0.625 V
Lead Channel Pacing Threshold Amplitude: 0.75 V
Lead Channel Pacing Threshold Pulse Width: 0.4 ms
Lead Channel Pacing Threshold Pulse Width: 0.4 ms
Lead Channel Pacing Threshold Pulse Width: 0.4 ms
Lead Channel Pacing Threshold Pulse Width: 0.4 ms
Lead Channel Sensing Intrinsic Amplitude: 11.2 mV
Lead Channel Setting Pacing Amplitude: 2 V
Lead Channel Setting Pacing Amplitude: 2.5 V
Lead Channel Setting Pacing Pulse Width: 0.4 ms
Lead Channel Setting Sensing Sensitivity: 5.6 mV
MDC IDC MSMT LEADCHNL RA SENSING INTR AMPL: 2 mV
MDC IDC MSMT LEADCHNL RV IMPEDANCE VALUE: 610 Ohm

## 2018-05-20 ENCOUNTER — Other Ambulatory Visit: Payer: Self-pay | Admitting: Internal Medicine

## 2018-05-20 ENCOUNTER — Encounter: Payer: PPO | Admitting: Internal Medicine

## 2018-06-03 ENCOUNTER — Other Ambulatory Visit: Payer: Self-pay | Admitting: Internal Medicine

## 2018-07-01 ENCOUNTER — Other Ambulatory Visit: Payer: Self-pay

## 2018-07-01 ENCOUNTER — Telehealth: Payer: Self-pay

## 2018-07-01 ENCOUNTER — Ambulatory Visit (INDEPENDENT_AMBULATORY_CARE_PROVIDER_SITE_OTHER): Payer: PPO | Admitting: *Deleted

## 2018-07-01 DIAGNOSIS — I25119 Atherosclerotic heart disease of native coronary artery with unspecified angina pectoris: Secondary | ICD-10-CM | POA: Diagnosis not present

## 2018-07-01 DIAGNOSIS — I495 Sick sinus syndrome: Secondary | ICD-10-CM | POA: Diagnosis not present

## 2018-07-01 DIAGNOSIS — I5032 Chronic diastolic (congestive) heart failure: Secondary | ICD-10-CM | POA: Diagnosis not present

## 2018-07-01 DIAGNOSIS — I25709 Atherosclerosis of coronary artery bypass graft(s), unspecified, with unspecified angina pectoris: Secondary | ICD-10-CM | POA: Diagnosis not present

## 2018-07-01 NOTE — Telephone Encounter (Signed)
Spoke with patient to remind of missed remote transmission 

## 2018-07-02 LAB — CUP PACEART REMOTE DEVICE CHECK
Battery Impedance: 257 Ohm
Battery Remaining Longevity: 110 mo
Battery Voltage: 2.79 V
Brady Statistic AP VP Percent: 0 %
Brady Statistic AP VS Percent: 88 %
Brady Statistic AS VP Percent: 0 %
Brady Statistic AS VS Percent: 12 %
Date Time Interrogation Session: 20200408114013
Implantable Lead Implant Date: 20150826
Implantable Lead Implant Date: 20150826
Implantable Lead Location: 753859
Implantable Lead Location: 753860
Implantable Lead Model: 5076
Implantable Lead Model: 5076
Implantable Pulse Generator Implant Date: 20150826
Lead Channel Impedance Value: 389 Ohm
Lead Channel Impedance Value: 619 Ohm
Lead Channel Pacing Threshold Amplitude: 0.375 V
Lead Channel Pacing Threshold Amplitude: 0.625 V
Lead Channel Pacing Threshold Pulse Width: 0.4 ms
Lead Channel Pacing Threshold Pulse Width: 0.4 ms
Lead Channel Setting Pacing Amplitude: 2 V
Lead Channel Setting Pacing Amplitude: 2.5 V
Lead Channel Setting Pacing Pulse Width: 0.4 ms
Lead Channel Setting Sensing Sensitivity: 4 mV

## 2018-07-10 ENCOUNTER — Encounter: Payer: Self-pay | Admitting: Cardiology

## 2018-07-10 NOTE — Progress Notes (Signed)
Remote pacemaker transmission.   

## 2018-07-16 ENCOUNTER — Other Ambulatory Visit: Payer: Self-pay | Admitting: Internal Medicine

## 2018-07-16 MED ORDER — ATENOLOL 25 MG PO TABS: ORAL_TABLET | ORAL | 0 refills | Status: DC

## 2018-07-18 ENCOUNTER — Telehealth: Payer: Self-pay | Admitting: Pharmacist

## 2018-07-18 MED ORDER — BEMPEDOIC ACID 180 MG PO TABS: 1.0000 | ORAL_TABLET | Freq: Every day | ORAL | 11 refills | Status: DC

## 2018-07-18 MED ORDER — EZETIMIBE 10 MG PO TABS: 10.0000 mg | ORAL_TABLET | Freq: Every day | ORAL | 11 refills | Status: DC

## 2018-07-18 NOTE — Addendum Note (Signed)
Addended by: SUPPLE, MEGAN E on: 07/18/2018 02:05 PM   Modules accepted: Orders

## 2018-07-18 NOTE — Telephone Encounter (Signed)
Nexletol PA approved through 03/26/19. Rx sent to pharmacy to determine copay which came back at $90. Pt prefers to hold off for the time being and will discuss with her PCP Dr Joylene Draft when she sees him next.

## 2018-07-18 NOTE — Telephone Encounter (Signed)
Followed up with pt regarding lipids, discussed Nexletol since this is now available. She would like to think about it, will submit prior authorization and follow up with pricing to see if it's affordable. LDL was 90 last February on Zetia 10mg  daily, close to goal < 70 due to hx of ASCVD.

## 2018-07-24 ENCOUNTER — Telehealth: Payer: Self-pay

## 2018-07-24 NOTE — Telephone Encounter (Signed)
Called pt to set up virtual visit with Dr. Harrington Challenger.  Pt declined virtual visit at this time as she does not feel she needs to speak with Dr. Harrington Challenger at this time.  Pt prefers to wait until she is able to come in for an OV.   Pt is a resident of [facility] and is very concerned about the virus spreading to her living facility. She agrees to call back with any cardiac concerns but states she does not have any issues at this time.   Will route to [place], RN for followup

## 2018-07-24 NOTE — Telephone Encounter (Signed)
Patient has a recall in [place] for July, 2020.

## 2018-09-23 NOTE — Telephone Encounter (Signed)
Called pt to follow up with lipids - she states she started taking atorvastatin 10mg  3-4x a week for the past 3-4 months. She states Dr Joylene Draft will be checking her lab work next week with a virtual follow up the following week. Will await lipid panel results.

## 2018-09-30 ENCOUNTER — Encounter: Payer: PPO | Admitting: *Deleted

## 2018-10-01 ENCOUNTER — Telehealth: Payer: Self-pay

## 2018-10-01 DIAGNOSIS — R7301 Impaired fasting glucose: Secondary | ICD-10-CM | POA: Diagnosis not present

## 2018-10-01 DIAGNOSIS — E7849 Other hyperlipidemia: Secondary | ICD-10-CM | POA: Diagnosis not present

## 2018-10-01 DIAGNOSIS — I10 Essential (primary) hypertension: Secondary | ICD-10-CM | POA: Diagnosis not present

## 2018-10-01 DIAGNOSIS — E538 Deficiency of other specified B group vitamins: Secondary | ICD-10-CM | POA: Diagnosis not present

## 2018-10-01 DIAGNOSIS — M859 Disorder of bone density and structure, unspecified: Secondary | ICD-10-CM | POA: Diagnosis not present

## 2018-10-01 DIAGNOSIS — R82998 Other abnormal findings in urine: Secondary | ICD-10-CM | POA: Diagnosis not present

## 2018-10-01 NOTE — Telephone Encounter (Signed)
Left message for patient to remind of missed remote transmission.  

## 2018-10-02 ENCOUNTER — Telehealth: Payer: Self-pay

## 2018-10-02 NOTE — Telephone Encounter (Signed)
Pt called stating medtronic is sending her a new monitor. She will call me as soon as she receives the new monitor.

## 2018-10-03 NOTE — Telephone Encounter (Signed)
F/u labs at PCP on 10/01/18: TC 145, TG 130, HDL 52, LDL 67. Pt tolerating low dose atorvastatin a few days a week and ezetimibe. Will continue current meds.

## 2018-10-08 DIAGNOSIS — H348122 Central retinal vein occlusion, left eye, stable: Secondary | ICD-10-CM | POA: Diagnosis not present

## 2018-10-08 DIAGNOSIS — M1611 Unilateral primary osteoarthritis, right hip: Secondary | ICD-10-CM | POA: Diagnosis not present

## 2018-10-08 DIAGNOSIS — I2510 Atherosclerotic heart disease of native coronary artery without angina pectoris: Secondary | ICD-10-CM | POA: Diagnosis not present

## 2018-10-08 DIAGNOSIS — K635 Polyp of colon: Secondary | ICD-10-CM | POA: Diagnosis not present

## 2018-10-08 DIAGNOSIS — I6529 Occlusion and stenosis of unspecified carotid artery: Secondary | ICD-10-CM | POA: Diagnosis not present

## 2018-10-08 DIAGNOSIS — Z950 Presence of cardiac pacemaker: Secondary | ICD-10-CM | POA: Diagnosis not present

## 2018-10-08 DIAGNOSIS — M6740 Ganglion, unspecified site: Secondary | ICD-10-CM | POA: Diagnosis not present

## 2018-10-08 DIAGNOSIS — I7090 Unspecified atherosclerosis: Secondary | ICD-10-CM | POA: Diagnosis not present

## 2018-10-08 DIAGNOSIS — N183 Chronic kidney disease, stage 3 (moderate): Secondary | ICD-10-CM | POA: Diagnosis not present

## 2018-10-08 DIAGNOSIS — M791 Myalgia, unspecified site: Secondary | ICD-10-CM | POA: Diagnosis not present

## 2018-10-08 DIAGNOSIS — Z0000 Encounter for general adult medical examination without abnormal findings: Secondary | ICD-10-CM | POA: Diagnosis not present

## 2018-10-08 DIAGNOSIS — Z1331 Encounter for screening for depression: Secondary | ICD-10-CM | POA: Diagnosis not present

## 2018-10-08 DIAGNOSIS — J45909 Unspecified asthma, uncomplicated: Secondary | ICD-10-CM | POA: Diagnosis not present

## 2018-10-15 ENCOUNTER — Telehealth: Payer: Self-pay

## 2018-10-15 NOTE — Telephone Encounter (Signed)
The pt called stating she received her new monitor today however, It is the [employer]. She do not have a smartphone or a tablet. She wants the monitor she had previously. I contacted [employer] to order the pt the previous model and a return kit for the new monitor.

## 2018-10-21 ENCOUNTER — Other Ambulatory Visit: Payer: Self-pay | Admitting: Internal Medicine

## 2018-10-23 ENCOUNTER — Ambulatory Visit (INDEPENDENT_AMBULATORY_CARE_PROVIDER_SITE_OTHER): Payer: PPO | Admitting: *Deleted

## 2018-10-23 DIAGNOSIS — I495 Sick sinus syndrome: Secondary | ICD-10-CM | POA: Diagnosis not present

## 2018-10-24 ENCOUNTER — Telehealth: Payer: Self-pay

## 2018-10-24 LAB — CUP PACEART REMOTE DEVICE CHECK
Battery Impedance: 281 Ohm
Battery Remaining Longevity: 105 mo
Battery Voltage: 2.79 V
Brady Statistic AP VP Percent: 0 %
Brady Statistic AP VS Percent: 95 %
Brady Statistic AS VP Percent: 0 %
Brady Statistic AS VS Percent: 5 %
Date Time Interrogation Session: 20200731142106
Implantable Lead Implant Date: 20150826
Implantable Lead Implant Date: 20150826
Implantable Lead Location: 753859
Implantable Lead Location: 753860
Implantable Lead Model: 5076
Implantable Lead Model: 5076
Implantable Pulse Generator Implant Date: 20150826
Lead Channel Impedance Value: 395 Ohm
Lead Channel Impedance Value: 605 Ohm
Lead Channel Pacing Threshold Amplitude: 0.375 V
Lead Channel Pacing Threshold Amplitude: 0.625 V
Lead Channel Pacing Threshold Pulse Width: 0.4 ms
Lead Channel Pacing Threshold Pulse Width: 0.4 ms
Lead Channel Setting Pacing Amplitude: 2 V
Lead Channel Setting Pacing Amplitude: 2.5 V
Lead Channel Setting Pacing Pulse Width: 0.4 ms
Lead Channel Setting Sensing Sensitivity: 5.6 mV

## 2018-10-24 NOTE — Telephone Encounter (Signed)
Spoke with patient to remind of missed remote transmission 

## 2018-10-24 NOTE — Telephone Encounter (Signed)
Spoke with patient. Advised transmission was successfully received. All questions answered.

## 2018-10-31 ENCOUNTER — Encounter: Payer: Self-pay | Admitting: Cardiology

## 2018-10-31 NOTE — Progress Notes (Signed)
Remote pacemaker transmission.   

## 2018-11-14 DIAGNOSIS — Z23 Encounter for immunization: Secondary | ICD-10-CM | POA: Diagnosis not present

## 2018-11-25 DIAGNOSIS — K219 Gastro-esophageal reflux disease without esophagitis: Secondary | ICD-10-CM | POA: Diagnosis not present

## 2018-11-25 DIAGNOSIS — Z800 Family history of malignant neoplasm of digestive organs: Secondary | ICD-10-CM | POA: Diagnosis not present

## 2018-11-25 DIAGNOSIS — K625 Hemorrhage of anus and rectum: Secondary | ICD-10-CM | POA: Diagnosis not present

## 2018-11-25 DIAGNOSIS — K601 Chronic anal fissure: Secondary | ICD-10-CM | POA: Diagnosis not present

## 2018-11-25 DIAGNOSIS — K5900 Constipation, unspecified: Secondary | ICD-10-CM | POA: Diagnosis not present

## 2018-11-27 ENCOUNTER — Telehealth: Payer: Self-pay | Admitting: Internal Medicine

## 2018-11-27 NOTE — Telephone Encounter (Signed)
New message     STAT if patient feels like he/she is going to faint   1) Are you dizzy now? Patient states that she is lightheaded   2) Do you feel faint or have you passed out? Yes   3) Do you have any other symptoms?weak and tired  4) Have you checked your HR and BP (record if available)? 108/74 hr 74

## 2018-11-27 NOTE — Telephone Encounter (Signed)
Light headedness that makes "me feel like I'm gonna pass out"   Light cough-not new.  Has happened 3 times in last couple weeks. She has had a sinus infection and also has inner ear problems. Has had good appetite and staying hydrated.  Usually lightheadedness has passes shortly but feels tired afterward.  Has happened with position changes.   Pt asking for appointment today or tomorrow with Dr. Harrington Challenger or someone on her care team.  She is unable to drive to [facility] to see Dr. Harrington Challenger tomorrow. Scheduled with Janan Ridge at [address] office tomorrow.  Pt aware I will route to Dr. Harrington Challenger to inform.

## 2018-11-27 NOTE — Telephone Encounter (Signed)
Will assess during office visit. But please make sure the patient does not drive herself given presyncope

## 2018-11-28 ENCOUNTER — Ambulatory Visit: Payer: PPO | Admitting: General Practice

## 2018-11-28 ENCOUNTER — Encounter: Payer: Self-pay | Admitting: Physician Assistant

## 2018-11-28 ENCOUNTER — Other Ambulatory Visit: Payer: Self-pay

## 2018-11-28 VITALS — BP 110/68 | Ht 63.5 in | Wt 171.0 lb

## 2018-11-28 DIAGNOSIS — R42 Dizziness and giddiness: Secondary | ICD-10-CM | POA: Diagnosis not present

## 2018-11-28 DIAGNOSIS — I10 Essential (primary) hypertension: Secondary | ICD-10-CM

## 2018-11-28 DIAGNOSIS — I2510 Atherosclerotic heart disease of native coronary artery without angina pectoris: Secondary | ICD-10-CM

## 2018-11-28 DIAGNOSIS — E785 Hyperlipidemia, unspecified: Secondary | ICD-10-CM

## 2018-11-28 MED ORDER — NITROGLYCERIN 0.4 MG SL SUBL: 0.4000 mg | SUBLINGUAL_TABLET | SUBLINGUAL | 0 refills | Status: DC | PRN

## 2018-11-28 NOTE — Patient Instructions (Signed)
Medication Instructions:  STOP Benadryl  STOP Losartan  If you need a refill on your cardiac medications before your next appointment, please call your pharmacy.    Follow-Up: At [facility], you and your health needs are our priority.  As part of our continuing mission to provide you with exceptional heart care, we have created designated Provider Care Teams.  These Care Teams include your primary Cardiologist (physician) and Advanced Practice Providers (APPs -  Physician Assistants and Nurse Practitioners) who all work together to provide you with the care you need, when you need it. You will need a follow up appointment in:  3 weeks.  Please call our office in advance to schedule this appointment.  You may see Dorris Carnes, MD or one of the following Advanced Practice Providers on your designated Care Team: Richardson Dopp, PA-C [place], [place] . Daune Perch, NP

## 2018-11-28 NOTE — Progress Notes (Signed)
Cardiology Clinic Note   Patient Name: Madison [name] Date of Encounter: 11/28/2018  Primary Care Provider:  Crist Infante, MD Primary Cardiologist:  Madison Carnes, MD  Patient Profile    Madison [name] [phone]. female presents today for lightheadedness .  Past Medical History    Past Medical History:  Diagnosis Date  . Acquired absence of breast and nipple   . Acute myocardial infarction, unspecified site, episode of care unspecified    1965 and 2015   . ALLERGIC RHINITIS   . Anginal pain ([place])   . Arthritis   . Asthma   . Blindness of left eye    decreased vision in left eye related to ocular occlusion   . Breast cancer ([place])    left mastectomy  . Complication of anesthesia   . Coronary atherosclerosis   . Cough   . Dysfunction of eustachian tube   . Esophageal reflux   . Heart murmur   . Hemorrhoids   . Hyperlipidemia   . Hypertension   . Peripheral vascular disease ([place])   . Pneumonia    hx of walking pneumonia x 2   . PONV (postoperative nausea and vomiting)   . Presence of permanent cardiac pacemaker   . PVC's (premature ventricular contractions)   . Shortness of breath   . Stress incontinence   . Syncope 11/17/2013   Past Surgical History:  Procedure Laterality Date  . ABDOMINAL HYSTERECTOMY  1967  . CARDIAC CATHETERIZATION  03/01/1986   normal coronaries (Dr. Domenic [name])  . CARDIAC CATHETERIZATION  10/25/2002   normal L main; LAD w/40% narrowing in prox 3rd and 60-70% narrowing beyond 1st diagonal, LAD was tortuous; dominant RCA with 30-40% segmental narrowing and 20-30% narrowing at junction of prox 3rd (Dr. Marella [name])  . CARDIAC CATHETERIZATION  04/11/2006   trivial luminal irregularities in coronaries and mid LAD 50% (Dr. Domenic [name])  . CARDIOPULMONARY MET TEST  05/05/2012   excellent effort w/RER 1.06, peak VO2>100%, peak HR 81%, good functional capacity  . CAROTID DOPPLER  2005   normal study (ordered for swelling & pain, left neck clavicle to ear)  .  DILATION AND CURETTAGE OF UTERUS  1962-1968   x4  . GALLBLADDER SURGERY  1985  . MASTECTOMY Left 1981  . MYOMECTOMY  1968  . NM MYOCAR PERF WALL MOTION  2012   bruce myoview -no inducible ischemia, EF 71%, low risk scan  . PERMANENT PACEMAKER INSERTION N/A 11/18/2013   Procedure: PERMANENT PACEMAKER INSERTION;  Surgeon: Madison Lance, MD;  Location: [facility] CATH LAB;  Service: Cardiovascular;  Laterality: N/A;  . PLACEMENT OF BREAST IMPLANTS  1993   [facility]  . REMOVAL OF BILATERAL TISSUE EXPANDERS WITH PLACEMENT OF BILATERAL BREAST IMPLANTS    . TOTAL HIP ARTHROPLASTY Right 08/02/2015   Procedure: RIGHT TOTAL HIP ARTHROPLASTY ANTERIOR APPROACH;  Surgeon: Madison Cancel, MD;  Location: [facility] ORS;  Service: Orthopedics;  Laterality: Right;  . TRANSTHORACIC ECHOCARDIOGRAM  2014   EF 39-76%, grade 1 diastolic dysfunction; mildly thickened MV leaflets, trivial regurg     Allergies  Allergies  Allergen Reactions  . Tape Other (See Comments)    PAPER TAPE -Raw, itching, bleeding skin  . Codeine Nausea And Vomiting and Other (See Comments)  . Latex     Other reaction(s): Other (See Comments) Other  . Other Nausea And Vomiting    Madison [name]  . Oxycodone-Acetaminophen     Other reaction(s): Dizziness (intolerance) Other reaction(s): Dizziness (intolerance) Other reaction(s): Dizziness (  intolerance)  . Penicillins Other (See Comments)    Heart palpitations Has patient had a PCN reaction causing immediate rash, facial/tongue/throat swelling, SOB or lightheadedness with hypotension: no Has patient had a PCN reaction causing severe rash involving mucus membranes or skin necrosis: no Has patient had a PCN reaction that required hospitalization yes - caused her to get a heart catheterization Has patient had a PCN reaction occurring within the last 10 years: about 22 years ago If all of the above answers are "NO", then may proceed with C  . Percodan [Oxycodone-Aspirin] Nausea And Vomiting  . Pravastatin Other  (See Comments)    Statins cause cramps  . Repatha [Evolocumab]     Flu like symptoms  . Vancomycin Hives    Other reaction(s): Other (See Comments)  . Epinephrine Other (See Comments) and Palpitations    Enhances longevity of Novocaine - severe heart palpatations     History of Present Illness    [name] was last seen by Madison [name] on 05/09/2018.  During that time she was doing well.  She had moved to an independent living center and denied chest pain, shortness of breath, edema, and palpitations.  She had a pacemaker inserted in 2015 due to sinus node dysfunction and heart block.  She also has hypertension, coronary artery disease, history of PVCs near syncope, sinus pause, GERD, eustachian tube dysfunction, obesity, dyslipidemia, and right THA.  She called see [facility] yesterday and stated that she was lightheaded 11/27/2018.  She presents to the clinic today and states she has had 3 episodes of lightheadedness over the past month.  She noticed that these episodes happen when she gets up fast from a seated position.  She states that she has not had any recent medication changes and that she continually drinks greater than 64 fluid ounces daily.  She has noticed that over the past several months her blood pressure which is normally 120/80 has been in the 110s to 100s over 70s.  Finally, she states that she occasionally takes diphenhydramine to help her sleep and also for allergies.  We performed orthostatic blood pressures today which were negative.  However, we will trial her off her losartan for now to see if this prevents her lightheadedness.  She denies chest pain, shortness of breath, lower extremity edema, fatigue, palpitations, melena, hematuria, hemoptysis, diaphoresis, weakness, syncope, orthopnea, and PND.   Home Medications    Prior to Admission medications   Medication Sig Start Date End Date Taking? Authorizing Provider  atenolol (TENORMIN) 25 MG tablet TAKE 1 TABLET IN  THE MORNING. 10/21/18   Madison Records, MD  atorvastatin (LIPITOR) 10 MG tablet Take 10 mg by mouth 3 (three) times a week.    [provider]  Biotin w/ Vitamins C & E (HAIR/SKIN/NAILS) 1250-7.5-7.5 MCG-MG-UNT CHEW Chew by mouth daily.    [provider]  bisacodyl (DULCOLAX) 5 MG EC tablet Take 5 mg by mouth daily as needed for moderate constipation.    [provider]  clonazePAM (KLONOPIN) 0.5 MG tablet Take 1 tablet by mouth 2 (two) times daily as needed for anxiety. 12/16/15   [provider]  diphenhydrAMINE (BENADRYL) 25 MG tablet Take 25 mg by mouth at bedtime as needed for allergies.    [provider]  dorzolamide (TRUSOPT) 2 % ophthalmic solution Place 1 drop into both eyes 2 (two) times daily.    [provider]  ezetimibe (ZETIA) 10 MG tablet Take 1 tablet (10 mg  total) by mouth daily. 07/18/18   Madison Records, MD  fluticasone [facility]) 50 MCG/ACT nasal spray Place 1 spray into both nostrils daily as needed for allergies or rhinitis.    [provider]  guaiFENesin (MUCINEX) 600 MG 12 hr tablet Take 600 mg by mouth 2 (two) times daily.    [provider]  losartan (COZAAR) 25 MG tablet Take 25 mg by mouth daily.    [provider]  Multiple Vitamin (MULTIVITAMIN) tablet Take 1 tablet by mouth daily.    [provider]  NITROSTAT 0.4 MG SL tablet PLACE 1 TABLET (0.4 MG TOTAL) UNDER THE TONGUE EVERY 5 (FIVE) MINUTES AS NEEDED FOR CHEST PAIN. 03/20/16   Hilty, Nadean Corwin, MD  omeprazole (PRILOSEC) 40 MG capsule Take 40 mg by mouth daily.    [provider]  Polyethyl Glycol-Propyl Glycol (SYSTANE) 0.4-0.3 % SOLN Place 1 drop into both eyes daily as needed (For dry eyes.).    [provider]    Family History    Family History  Problem Relation Age of Onset  . Heart attack Brother   . Stroke Brother   . Lymphoma Sister   . Colon cancer Sister   . Heart disease Mother   . Stroke  Mother   . Colon cancer Mother 87  . Cancer Mother   . CAD Brother        + stents  . CAD Brother        + stents  . Stroke Brother   . Cervical cancer Sister   . Liver cancer Brother   . Heart disease Maternal Grandmother   . Stroke Maternal Grandmother   . Heart disease Maternal Grandfather   . Cancer Maternal Grandfather   . Heart disease Paternal Grandmother   . Stroke Paternal Grandfather   . Cancer Sister   . Cancer Brother    She indicated that her mother is deceased. She indicated that her father is deceased. She indicated that two of her four sisters are deceased. She indicated that two of her six brothers are deceased. She indicated that her maternal grandmother is deceased. She indicated that her maternal grandfather is deceased. She indicated that her paternal grandmother is deceased. She indicated that her paternal grandfather is deceased.  Social History    Social History   Socioeconomic History  . Marital status: Married    Spouse name: Not on file  . Number of children: 1  . Years of education: Not on file  . Highest education level: Not on file  Occupational History  . Occupation: RETIRED-swim Tax adviser  Social Needs  . Financial resource strain: Not on file  . Food insecurity    Worry: Not on file    Inability: Not on file  . Transportation needs    Medical: Not on file    Non-medical: Not on file  Tobacco Use  . Smoking status: Never Smoker  . Smokeless tobacco: Never Used  Substance and Sexual Activity  . Alcohol use: Yes    Comment: 2 glasses of wine with dinner occasional   . Drug use: No  . Sexual activity: Not on file  Lifestyle  . Physical activity    Days per week: Not on file    Minutes per session: Not on file  . Stress: Not on file  Relationships  . Social Herbalist on phone: Not on file    Gets together: Not on file  Attends religious service: Not on file    Active member of club or organization:  Not on file    Attends meetings of clubs or organizations: Not on file    Relationship status: Not on file  . Intimate partner violence    Fear of current or ex partner: Not on file    Emotionally abused: Not on file    Physically abused: Not on file    Forced sexual activity: Not on file  Other Topics Concern  . Not on file  Social History Narrative  . Not on file     Review of Systems    General:  No chills, fever, night sweats or weight changes.  Cardiovascular:  No chest pain, dyspnea on exertion, edema, orthopnea, palpitations, paroxysmal nocturnal dyspnea. Dermatological: No rash, lesions/masses Respiratory: No cough, dyspnea Urologic: No hematuria, dysuria Abdominal:   No nausea, vomiting, diarrhea, bright red blood per rectum, melena, or hematemesis Neurologic:  No visual changes, wkns, changes in mental status. All other systems reviewed and are otherwise negative except as noted above.  Physical Exam    VS:  Ht 5' 3.5" (1.613 m)   Wt 171 lb (77.6 kg)   BMI 29.82 kg/m  , BMI Body mass index is 29.82 kg/m. GEN: Well nourished, well developed, in no acute distress. HEENT: normal. Neck: Supple, no JVD, carotid bruits, or masses. Cardiac: RRR, no murmurs, rubs, or gallops. No clubbing, cyanosis, edema.  Radials/DP/PT 2+ and equal bilaterally.  Respiratory:  Respirations regular and unlabored, clear to auscultation bilaterally. GI: Soft, nontender, nondistended, BS + x 4. MS: no deformity or atrophy. Skin: warm and dry, no rash. Neuro:  Strength and sensation are intact. Psych: Normal affect.  Accessory Clinical Findings    ECG personally reviewed by me today- Atrial paced with prolonged AV conduction.73 bpm - No acute changes  EKG 05/09/2018 Atrial paced with prolonged AV conduction 71 bpm  Echocardiogram 10/28/2014 Study Conclusions  - Left ventricle: The cavity size was normal. There was mild   concentric hypertrophy. Systolic function was normal. The    estimated ejection fraction was in the range of 55% to 60%. Wall   motion was normal; there were no regional wall motion   abnormalities. Doppler parameters are consistent with abnormal   left ventricular relaxation (grade 1 diastolic dysfunction).   Cardiac catheterization 04/11/2006   Trivial luminal irregularities in coronaries and mid LAD 50% (Dr. Domenic [name]). Assessment & Plan   1.  Dizziness-she noticed episodes of lightheadedness.EKG today shows  PPM inserted 2015 due to complete heart block and sinus node dysfunction.  Last remote device check indicated 8.5 years of battery life, both atrial and ventricular leads okay, a low heart rate limit of 60 and an upper limit of 120.  Carotid Dopplers 2015 no evidence of carotid artery disease, within normal limits .  Negative Orthostatics. Stop taking diphenhydramine Increase p.o. hydration Hold 25 mg losartan   2.  Coronary artery disease-no chest pain today.  Cardiac catheterization 04/11/2006 showed trivial luminal irregularities in coronaries and mid LAD 50% (Dr. Domenic [name]). Continue Lipitor 10 mg tablet 3 times a week Continue Zetia 10 mg tablet daily Continue nitroglycerin 0.4 mg sublingual tablet as needed Increase physical activity as tolerated  Low-sodium heart healthy diet  3.Essential hypertension-blood pressure today 110/68, Negative Orthostatics. Continue atenolol 25 mg daily Hold losartan 25 mg  Increase physical activity as tolerated  Low-sodium heart healthy diet  4. Hyperlipidemia- LDL 109 10/28/2014 Continue Lipitor 10 mg tablet  3 times a week Continue Zetia 10 mg tablet daily Increase physical activity as tolerated  Low-sodium heart healthy diet  Disposition: follow up with MadisonRoss in 3 weeks.  Deberah Pelton, NP-C 11/28/2018, 5:07 PM

## 2018-12-09 DIAGNOSIS — Z950 Presence of cardiac pacemaker: Secondary | ICD-10-CM | POA: Diagnosis not present

## 2018-12-09 DIAGNOSIS — I2510 Atherosclerotic heart disease of native coronary artery without angina pectoris: Secondary | ICD-10-CM | POA: Diagnosis not present

## 2018-12-09 DIAGNOSIS — I10 Essential (primary) hypertension: Secondary | ICD-10-CM | POA: Diagnosis not present

## 2018-12-09 DIAGNOSIS — J45909 Unspecified asthma, uncomplicated: Secondary | ICD-10-CM | POA: Diagnosis not present

## 2018-12-09 DIAGNOSIS — M791 Myalgia, unspecified site: Secondary | ICD-10-CM | POA: Diagnosis not present

## 2018-12-09 DIAGNOSIS — R251 Tremor, unspecified: Secondary | ICD-10-CM | POA: Diagnosis not present

## 2018-12-12 ENCOUNTER — Telehealth: Payer: Self-pay | Admitting: Internal Medicine

## 2018-12-12 NOTE — Telephone Encounter (Signed)
°  Patient wanted to know if it was OK with Dr. Harrington Challenger for her to have a Colonoscopy . She went to the PA at [facility] for dizziness on 09/04, and the PA discontinued one of her medications at the time of that visit. An EKG was done at the visit with the PA.   She has also seen her PCP, and a Gastroenterologist since the visit with the PA.  Her PCP did not see any reason as to why she shouldn't have the colonoscopy. She just wants to check with Dr. Harrington Challenger before she makes the appointment,   If you can not reach her on her, you can contact her on her husband's cell phone.

## 2018-12-15 NOTE — Telephone Encounter (Signed)
From cardiac standpoint I think it is OK for pt to have colonscopy

## 2018-12-18 ENCOUNTER — Telehealth: Payer: Self-pay | Admitting: Family Medicine

## 2018-12-18 NOTE — Telephone Encounter (Signed)
Left message for patient to call back or to follow up tomorrow at appointment with Dr. Harrington Challenger.

## 2018-12-18 NOTE — Telephone Encounter (Signed)
I spoke to patient and informed per Dr. Harrington Challenger from cardiac standpoint she would be ok to have colonoscopy.  She has appt scheduled 12/19/18 and is anxious to discuss with Dr. Harrington Challenger the reason for her concern-that her blood pressures have still be on the low side.  102/   She has stopped losartan.  She has a fissure and she notes some bright read blood at times.  Her mother and sisters died from colon cancer and so she is very worried.  Her last hgb/hct were normal in July.

## 2018-12-18 NOTE — Telephone Encounter (Signed)
Follow Up:; ° ° °Returning your call. °

## 2018-12-19 ENCOUNTER — Encounter: Payer: Self-pay | Admitting: Internal Medicine

## 2018-12-19 ENCOUNTER — Telehealth: Payer: Self-pay

## 2018-12-19 ENCOUNTER — Ambulatory Visit: Payer: PPO | Admitting: Internal Medicine

## 2018-12-19 ENCOUNTER — Ambulatory Visit (INDEPENDENT_AMBULATORY_CARE_PROVIDER_SITE_OTHER): Payer: PPO | Admitting: *Deleted

## 2018-12-19 ENCOUNTER — Other Ambulatory Visit: Payer: Self-pay

## 2018-12-19 VITALS — Ht 63.5 in | Wt 170.0 lb

## 2018-12-19 DIAGNOSIS — Z950 Presence of cardiac pacemaker: Secondary | ICD-10-CM

## 2018-12-19 DIAGNOSIS — E785 Hyperlipidemia, unspecified: Secondary | ICD-10-CM

## 2018-12-19 DIAGNOSIS — Z79899 Other long term (current) drug therapy: Secondary | ICD-10-CM | POA: Diagnosis not present

## 2018-12-19 DIAGNOSIS — R42 Dizziness and giddiness: Secondary | ICD-10-CM | POA: Diagnosis not present

## 2018-12-19 DIAGNOSIS — I442 Atrioventricular block, complete: Secondary | ICD-10-CM | POA: Diagnosis not present

## 2018-12-19 DIAGNOSIS — I10 Essential (primary) hypertension: Secondary | ICD-10-CM | POA: Diagnosis not present

## 2018-12-19 DIAGNOSIS — I495 Sick sinus syndrome: Secondary | ICD-10-CM

## 2018-12-19 NOTE — Telephone Encounter (Signed)
Pt sent a transmission on Dr. Harrington Challenger request. I had Jonni Sanger, [place] look at the transmission and states the pt ppm shows normal device function. I relayed the message to the pt. The pt asked was her hurt ok. I told the pt we could only see through the ppm perspective and the ppm shows normal device function with no symptoms at this time. The pt verbalized understanding and thanked me for the help.

## 2018-12-19 NOTE — Patient Instructions (Signed)
Medication Instructions:  Your physician recommends that you continue on your current medications as directed. Please refer to the Current Medication list given to you today.  If you need a refill on your cardiac medications before your next appointment, please call your pharmacy.   Lab work: CBC, BMET, CORTISOL  If you have labs (blood work) drawn today and your tests are completely normal, you will receive your results only by: Marland Kitchen MyChart Message (if you have MyChart) OR . A paper copy in the mail If you have any lab test that is abnormal or we need to change your treatment, we will call you to review the results.  Testing/Procedures: NONE  Follow-Up: Your physician wants you to follow-up in: 12 months with Dr. Dorris Carnes. You will receive a reminder letter in the mail two months in advance. If you don't receive a letter, please call our office to schedule the follow-up appointment.  At [facility], you and your health needs are our priority.  As part of our continuing mission to provide you with exceptional heart care, we have created designated Provider Care Teams.  These Care Teams include your primary Cardiologist (physician) and Advanced Practice Providers (APPs -  Physician Assistants and Nurse Practitioners) who all work together to provide you with the care you need, when you need it.   Any Other Special Instructions Will Be Listed Below (If Applicable).  INCREASE THE SALT IN YOUR DIET.  ELEVATE YOUR FEET WHEN SITTING AND THE HEAD OF YOUR BED.  LEG EXERCISES WHEN YOU ARE NOT MOVING AROUND A LOT.

## 2018-12-19 NOTE — Progress Notes (Signed)
Cardiology Office Note   Date:  12/19/2018   ID:  Amberle, Lyter [birth year], MRN [id number]  PCP:  Crist Infante, MD  Cardiologist:   Dorris Carnes, MD   F/U of CAD and dizziness   History of Present Illness: Madison [name] is a 85 y.o. female with a history of mild to mod CAD  Myovue in 2015 normal Echo showed normal LVEF Cardopulmonary stress test consistent with deconditioning.   The pt also has hx of HTN, PVCs, GERD, HL  And she is  s/p PPM in 2015 for SN dysfunction    I last saw her in clinic in Jan 2019   She is also followed by Beckie Salts  The pt was recently seen in clinic by APP (9/4)  SHe had a few spells of lightheadedness over the month prior  Usuall when getting up from sitting  NO syncope    Has come down on meds   Losartan stopped at last visit    SInce that visit hse has had one other spell of dizzienss  Overall says she has no energy like she used to  Denies CP   Breathing is OK    Current Meds  Medication Sig  . atenolol (TENORMIN) 25 MG tablet TAKE 1 TABLET IN THE MORNING.  Marland Kitchen atorvastatin (LIPITOR) 10 MG tablet Take 10 mg by mouth 3 (three) times a week.  . bisacodyl (DULCOLAX) 5 MG EC tablet Take 5 mg by mouth daily as needed for moderate constipation.  . brimonidine (ALPHAGAN) 0.2 % ophthalmic solution   . cholecalciferol (VITAMIN D3) 25 MCG (1000 UT) tablet Take 1,000 Units by mouth 2 (two) times a week.  . clonazePAM (KLONOPIN) 0.5 MG tablet Take 1 tablet by mouth 2 (two) times daily as needed for anxiety.  Marland Kitchen ezetimibe (ZETIA) 10 MG tablet Take 1 tablet (10 mg total) by mouth daily.  . fluticasone (FLONASE) 50 MCG/ACT nasal spray Place 1 spray into both nostrils daily as needed for allergies or rhinitis.  Marland Kitchen guaiFENesin (MUCINEX) 600 MG 12 hr tablet Take 600 mg by mouth 2 (two) times daily.  . Multiple Vitamin (MULTIVITAMIN) tablet Take 1 tablet by mouth daily.  . nitroGLYCERIN (NITROSTAT) 0.4 MG SL tablet Place 1 tablet (0.4 mg total) under the tongue  every 5 (five) minutes as needed for chest pain.  Vladimir Faster Glycol-Propyl Glycol (SYSTANE) 0.4-0.3 % SOLN Place 1 drop into both eyes daily as needed (For dry eyes.).     Allergies:   Tape, Codeine, Latex, Other, Oxycodone-acetaminophen, Penicillins, Percodan [oxycodone-aspirin], Pravastatin, Repatha [evolocumab], Vancomycin, and Epinephrine   Past Medical History:  Diagnosis Date  . Acquired absence of breast and nipple   . Acute myocardial infarction, unspecified site, episode of care unspecified    1965 and 2015   . ALLERGIC RHINITIS   . Anginal pain ([place])   . Arthritis   . Asthma   . Blindness of left eye    decreased vision in left eye related to ocular occlusion   . Breast cancer ([place])    left mastectomy  . Complication of anesthesia   . Coronary atherosclerosis   . Cough   . Dysfunction of eustachian tube   . Esophageal reflux   . Heart murmur   . Hemorrhoids   . Hyperlipidemia   . Hypertension   . Peripheral vascular disease ([place])   . Pneumonia    hx of walking pneumonia x 2   . PONV (postoperative nausea and vomiting)   .  Presence of permanent cardiac pacemaker   . PVC's (premature ventricular contractions)   . Shortness of breath   . Stress incontinence   . Syncope 11/17/2013    Past Surgical History:  Procedure Laterality Date  . ABDOMINAL HYSTERECTOMY  1967  . CARDIAC CATHETERIZATION  03/01/1986   normal coronaries (Dr. Domenic Moras)  . CARDIAC CATHETERIZATION  10/25/2002   normal L main; LAD w/40% narrowing in prox 3rd and 60-70% narrowing beyond 1st diagonal, LAD was tortuous; dominant RCA with 30-40% segmental narrowing and 20-30% narrowing at junction of prox 3rd (Dr. Marella Chimes)  . CARDIAC CATHETERIZATION  04/11/2006   trivial luminal irregularities in coronaries and mid LAD 50% (Dr. Domenic Moras)  . CARDIOPULMONARY MET TEST  05/05/2012   excellent effort w/RER 1.06, peak VO2>100%, peak HR 81%, good functional capacity  . CAROTID DOPPLER  2005   normal  study (ordered for swelling & pain, left neck clavicle to ear)  . DILATION AND CURETTAGE OF UTERUS  1962-1968   x4  . GALLBLADDER SURGERY  1985  . MASTECTOMY Left 1981  . MYOMECTOMY  1968  . NM MYOCAR PERF WALL MOTION  2012   bruce myoview -no inducible ischemia, EF 71%, low risk scan  . PERMANENT PACEMAKER INSERTION N/A 11/18/2013   Procedure: PERMANENT PACEMAKER INSERTION;  Surgeon: Evans Lance, MD;  Location: [facility] C[facility] LAB;  Service: Cardiovascular;  Laterality: N/A;  . PLACEMENT OF BREAST IMPLANTS  1993   [facility]  . REMOVAL OF BILATERAL TISSUE EXPANDERS WITH PLACEMENT OF BILATERAL BREAST IMPLANTS    . TOTAL HIP ARTHROPLASTY Right 08/02/2015   Procedure: RIGHT TOTAL HIP ARTHROPLASTY ANTERIOR APPROACH;  Surgeon: Paralee Cancel, MD;  Location: [facility] ORS;  Service: Orthopedics;  Laterality: Right;  . TRANSTHORACIC ECHOCARDIOGRAM  2014   EF 69-62%, grade 1 diastolic dysfunction; mildly thickened MV leaflets, trivial regurg      Social History:  The patient  reports that she has never smoked. She has never used smokeless tobacco. She reports current alcohol use. She reports that she does not use drugs.   Family History:  The patient's family history includes CAD in her brother and brother; Cancer in her brother, maternal grandfather, mother, and sister; Cervical cancer in her sister; Colon cancer in her sister; Colon cancer (age of onset: 71) in her mother; Heart attack in her brother; Heart disease in her maternal grandfather, maternal grandmother, mother, and paternal grandmother; Liver cancer in her brother; Lymphoma in her sister; Stroke in her brother, brother, maternal grandmother, mother, and paternal grandfather.    ROS:  Please see the history of present illness. All other systems are reviewed and  Negative to the above problem except as noted.    PHYSICAL EXAM: VS:  Ht 5' 3.5" (1.613 m)   Wt 170 lb (77.1 kg)   SpO2 97%   BMI 29.64 kg/m    BP 112/74  P 62   Sitting   114/82  P 64    Standing  108/72  P 68  Standing 3 mint  106/72  P 64  GEN: Well nourished, well developed, in no acute distress  HEENT: normal  Neck: no JVD, carotid bruits, or masses Cardiac: RRR; no murmurs, rubs, or gallops,no edema  Respiratory:  clear to auscultation bilaterally, normal work of breathing GI: soft, nontender, nondistended, + BS  No hepatomegaly  MS: no deformity Moving all extremities   Skin: warm and dry, no rash Neuro:  Strength and sensation are intact Psych: euthymic mood, full  affect   EKG:  EKG is not ordered today.   Lipid Panel    Component Value Date/Time   CHOL 176 10/28/2014 0440   TRIG 126 10/28/2014 0440   HDL 42 10/28/2014 0440   CHOLHDL 4.2 10/28/2014 0440   VLDL 25 10/28/2014 0440   LDLCALC 109 (H) 10/28/2014 0440      Wt Readings from Last 3 Encounters:  12/19/18 170 lb (77.1 kg)  11/28/18 171 lb (77.6 kg)  05/09/18 170 lb (77.1 kg)      ASSESSMENT AND PLAN:  1  Dizziness  Pt is not orthostatic on exam   BP is lower than it has been in past    Encouraged her to increase salt and fluid intake  SPANX  Elevate HOB   Will check coritsol today Back down on atenolol to 12.5 mg Pacer to be checked remotely  2  CAD   I am not convinced above fatigue represents angina   FOllow  3  HL  LDL 145  HDL 52  LDL 67   Follow     Current medicines are reviewed at length with the patient today.  The patient does not have concerns regarding medicines.  Signed, Dorris Carnes, MD  12/19/2018 9:21 PM    [address], [place], [place]  [address] Phone: [phone]; Fax: [phone]

## 2018-12-20 LAB — CBC
Hematocrit: 40 % (ref 34.0–46.6)
Hemoglobin: 13.5 g/dL (ref 11.1–15.9)
MCH: 29 pg (ref 26.6–33.0)
MCHC: 33.8 g/dL (ref 31.5–35.7)
MCV: 86 fL (ref 79–97)
Platelets: 228 10*3/uL (ref 150–450)
RBC: 4.66 x10E6/uL (ref 3.77–5.28)
RDW: 11.9 % (ref 11.7–15.4)
WBC: 5.1 10*3/uL (ref 3.4–10.8)

## 2018-12-20 LAB — BASIC METABOLIC PANEL
BUN/Creatinine Ratio: 14 (ref 12–28)
BUN: 15 mg/dL (ref 8–27)
CO2: 24 mmol/L (ref 20–29)
Calcium: 9.4 mg/dL (ref 8.7–10.3)
Chloride: 103 mmol/L (ref 96–106)
Creatinine, Ser: 1.11 mg/dL — ABNORMAL HIGH (ref 0.57–1.00)
GFR calc Af Amer: 53 mL/min/{1.73_m2} — ABNORMAL LOW (ref >59)
GFR calc non Af Amer: 46 mL/min/{1.73_m2} — ABNORMAL LOW (ref >59)
Glucose: 107 mg/dL — ABNORMAL HIGH (ref 65–99)
Potassium: 5.1 mmol/L (ref 3.5–5.2)
Sodium: 140 mmol/L (ref 134–144)

## 2018-12-20 LAB — CORTISOL: Cortisol: 10.3 ug/dL

## 2018-12-21 LAB — CUP PACEART REMOTE DEVICE CHECK
Battery Impedance: 282 Ohm
Battery Remaining Longevity: 105 mo
Battery Voltage: 2.79 V
Brady Statistic AP VP Percent: 0 %
Brady Statistic AP VS Percent: 96 %
Brady Statistic AS VP Percent: 0 %
Brady Statistic AS VS Percent: 4 %
Date Time Interrogation Session: 20200925182627
Implantable Lead Implant Date: 20150826
Implantable Lead Implant Date: 20150826
Implantable Lead Location: 753859
Implantable Lead Location: 753860
Implantable Lead Model: 5076
Implantable Lead Model: 5076
Implantable Pulse Generator Implant Date: 20150826
Lead Channel Impedance Value: 366 Ohm
Lead Channel Impedance Value: 617 Ohm
Lead Channel Pacing Threshold Amplitude: 0.375 V
Lead Channel Pacing Threshold Amplitude: 0.625 V
Lead Channel Pacing Threshold Pulse Width: 0.4 ms
Lead Channel Pacing Threshold Pulse Width: 0.4 ms
Lead Channel Setting Pacing Amplitude: 2 V
Lead Channel Setting Pacing Amplitude: 2.5 V
Lead Channel Setting Pacing Pulse Width: 0.4 ms
Lead Channel Setting Sensing Sensitivity: 4 mV

## 2018-12-22 ENCOUNTER — Telehealth: Payer: Self-pay | Admitting: Internal Medicine

## 2018-12-22 NOTE — Telephone Encounter (Signed)
Patient's medication was changed on Friday, she was advised to call today to give update on BP: 148/92 for today.  She is concerned she states that is too high for her.

## 2018-12-22 NOTE — Telephone Encounter (Signed)
Spoke with patient. Informed of lab results and review by Dr. Harrington Challenger. She is concerned that her BP today was elevated by nurse check 148/92. She has an appointment tomorrow at Dr. Lorie Apley office and will pay attention to what BP is. She will be getting a blood pressure cuff later this week and keep track of readings.   Adv that since she just decreased atenolol to 12.5 mg Friday, we should get a couple more bp readings before making any further adjustments, since Dr. Harrington Challenger is adjusting due to dizziness and lower BP readings. Pt is in agreement with this plan.

## 2018-12-23 ENCOUNTER — Encounter: Payer: Self-pay | Admitting: Cardiology

## 2018-12-23 DIAGNOSIS — Z800 Family history of malignant neoplasm of digestive organs: Secondary | ICD-10-CM | POA: Diagnosis not present

## 2018-12-23 DIAGNOSIS — K5900 Constipation, unspecified: Secondary | ICD-10-CM | POA: Diagnosis not present

## 2018-12-23 DIAGNOSIS — K5730 Diverticulosis of large intestine without perforation or abscess without bleeding: Secondary | ICD-10-CM | POA: Diagnosis not present

## 2018-12-23 DIAGNOSIS — K601 Chronic anal fissure: Secondary | ICD-10-CM | POA: Diagnosis not present

## 2018-12-23 NOTE — Addendum Note (Signed)
Addended by: Tiajuana Amass on: 12/23/2018 04:32 PM   Modules accepted: Level of Service

## 2018-12-23 NOTE — Progress Notes (Signed)
Remote pacemaker transmission.   

## 2018-12-30 NOTE — Telephone Encounter (Signed)
Follow Up  Patient is calling in to report the blood pressure readings that she is supposed to be documenting starting from 12/15/18 to 12/30/18.  While Taking Losartan 12/15/18 - 102/70 (sitting) & 90/68 (standing) 12/17/18 - 120/70 (sitting) & 108/62 (standing) 12/19/18 - 106/82 (sitting) & 114/80 (standing)  Coming off Losartan 12/22/18 - 148/90 (sitting) &128/92 (standing) 12/24/18 - 122/80 (sitting) &110/78 (standing) 12/26/18 - 124/78 (sitting) & 117/78(standing)  After Reducing to 12.5 mg of Atenolol 12/27/18 - 141/92 HR 94 (sitting) & 122/83 HR 81 (standing) 12/28/18 - 113/81 HR 81 (sitting) & 96/74 HR 71 (standing) 12/29/18 - 148/94 HR 81 (sitting) & 109/87 HR 78 (standing) 12/30/18 - 109/78 HR 81 (laying down) & 142/93 HR 87 (sitting) & 138/95 HR 82 (standing)   Patient would like a call back to inform if she needs to adjust medications or if the blood pressure readings are ok. Please give patient a call back.

## 2018-12-31 NOTE — Telephone Encounter (Signed)
Will route to Dr. Harrington Challenger to review and for any further recommendations.

## 2019-01-01 DIAGNOSIS — H401131 Primary open-angle glaucoma, bilateral, mild stage: Secondary | ICD-10-CM | POA: Diagnosis not present

## 2019-01-01 DIAGNOSIS — Z961 Presence of intraocular lens: Secondary | ICD-10-CM | POA: Diagnosis not present

## 2019-01-01 DIAGNOSIS — H25812 Combined forms of age-related cataract, left eye: Secondary | ICD-10-CM | POA: Diagnosis not present

## 2019-01-01 DIAGNOSIS — H34812 Central retinal vein occlusion, left eye, with macular edema: Secondary | ICD-10-CM | POA: Diagnosis not present

## 2019-01-02 NOTE — Telephone Encounter (Signed)
Spoke with pt   She is still not feeling right   She says this all began about a month ago   She lives at [employer]   Had salad for lunch one day    Had vomiting and diarrhe that night and the following day   Has not had any since but bowels have not been right  That is when she got dizzy  On review of her BP measurements they do go down with standing but not as low as when she was on higher doses of meds   She is followed in GI   Will caull to discuss    I have asked her to keep track of BP over the next week  Call the follow ing week with how she is feeling.

## 2019-01-16 ENCOUNTER — Telehealth: Payer: Self-pay | Admitting: Internal Medicine

## 2019-01-16 NOTE — Telephone Encounter (Signed)
New message   B/p readings:  10/17  105/82 hr 74 10:00am  124/84 hr 83 7:30 pm 10/18 136/93 hr 79 9:00am 150/98 hr 87 8:30pm 10/19 134/90 hr 98 9:30am 91/60 hr 81 11:00am 10/20 128/87 hr 84 10:30am  10/22 101/70 hr 65 11:30am  153/98 hr 87 8:15pm 10/23  151/85 hr 86 7:45am

## 2019-01-16 NOTE — Telephone Encounter (Signed)
Pt sending BP readings as requested.  Takes Atenolol 12.5mg  every night.  States dizziness/lightheadedness has resolved since she spoke with Dr. Harrington Challenger.  Has increased salt as instructed but says she only puts a couple of small sprinkles on her salad.  Increased her water intake.  Denies any swelling.  Used an exercise machine for 15 mins the other day and BP was 101/70, HR 65 after.  States when BP gets that low she feels weak like a "limp noodle" and has to rest.  Advised I would send message to Dr. Harrington Challenger for review and advisement.

## 2019-01-22 ENCOUNTER — Encounter: Payer: PPO | Admitting: *Deleted

## 2019-01-27 ENCOUNTER — Telehealth: Payer: Self-pay | Admitting: Internal Medicine

## 2019-01-27 NOTE — Telephone Encounter (Signed)
Called pt   To see how she was feeling  Since backing down on her atenolol she has been feeling better Only one dzzy spell  BP 99/   Admits to not drinking much that day Otherwise BP measurements have been 110s to 130s/   No dizziness  No SOb   No CP with activity Does have reflux and will be contacting Dr Collene Mares

## 2019-04-09 DIAGNOSIS — K5900 Constipation, unspecified: Secondary | ICD-10-CM | POA: Diagnosis not present

## 2019-04-09 DIAGNOSIS — L290 Pruritus ani: Secondary | ICD-10-CM | POA: Diagnosis not present

## 2019-04-09 DIAGNOSIS — B373 Candidiasis of vulva and vagina: Secondary | ICD-10-CM | POA: Diagnosis not present

## 2019-04-09 DIAGNOSIS — Z800 Family history of malignant neoplasm of digestive organs: Secondary | ICD-10-CM | POA: Diagnosis not present

## 2019-04-09 DIAGNOSIS — K5730 Diverticulosis of large intestine without perforation or abscess without bleeding: Secondary | ICD-10-CM | POA: Diagnosis not present

## 2019-04-23 ENCOUNTER — Ambulatory Visit (INDEPENDENT_AMBULATORY_CARE_PROVIDER_SITE_OTHER): Payer: PPO | Admitting: *Deleted

## 2019-04-23 DIAGNOSIS — Z950 Presence of cardiac pacemaker: Secondary | ICD-10-CM

## 2019-04-27 ENCOUNTER — Telehealth: Payer: Self-pay

## 2019-04-27 LAB — CUP PACEART REMOTE DEVICE CHECK
Battery Impedance: 330 Ohm
Battery Remaining Longevity: 101 mo
Battery Voltage: 2.79 V
Brady Statistic AP VP Percent: 0 %
Brady Statistic AP VS Percent: 95 %
Brady Statistic AS VP Percent: 0 %
Brady Statistic AS VS Percent: 5 %
Date Time Interrogation Session: 20210201095148
Implantable Lead Implant Date: 20150826
Implantable Lead Implant Date: 20150826
Implantable Lead Location: 753859
Implantable Lead Location: 753860
Implantable Lead Model: 5076
Implantable Lead Model: 5076
Implantable Pulse Generator Implant Date: 20150826
Lead Channel Impedance Value: 405 Ohm
Lead Channel Impedance Value: 647 Ohm
Lead Channel Pacing Threshold Amplitude: 0.375 V
Lead Channel Pacing Threshold Amplitude: 0.5 V
Lead Channel Pacing Threshold Pulse Width: 0.4 ms
Lead Channel Pacing Threshold Pulse Width: 0.4 ms
Lead Channel Setting Pacing Amplitude: 2 V
Lead Channel Setting Pacing Amplitude: 2.5 V
Lead Channel Setting Pacing Pulse Width: 0.4 ms
Lead Channel Setting Sensing Sensitivity: 4 mV

## 2019-04-27 NOTE — Telephone Encounter (Signed)
Lm to call back ./cy 

## 2019-04-27 NOTE — Telephone Encounter (Signed)
DPR on file.  Attempted to return call, no answer, left detailed message on identified VM advising transmission received, device functioning properly.  There were short episodes of fast rate similar to what she has had in the past, not concerning.

## 2019-04-27 NOTE — Telephone Encounter (Signed)
Pt returned my phone call.  Informed pt of info left on VM,  Device functioning appropriately no concerning rhythm episodes.  Pt requested callback from Dr. Alan Ripper nurse so that she can provide recent BP results.  Pt stated she will be out for an appt tomorrow between 9:30am-1:00pm

## 2019-04-27 NOTE — Telephone Encounter (Signed)
Pt wants to know the results of her transmission from today. Her phone number is [phone].

## 2019-04-28 DIAGNOSIS — C50912 Malignant neoplasm of unspecified site of left female breast: Secondary | ICD-10-CM | POA: Diagnosis not present

## 2019-04-28 DIAGNOSIS — Z853 Personal history of malignant neoplasm of breast: Secondary | ICD-10-CM | POA: Diagnosis not present

## 2019-04-28 NOTE — Telephone Encounter (Signed)
Left message for patient to call back  

## 2019-04-29 ENCOUNTER — Telehealth: Payer: Self-pay | Admitting: Internal Medicine

## 2019-04-29 NOTE — Telephone Encounter (Signed)
Patient calling with BP readings:  1/21 110/77 HR 81 light headed 12:00pm, 1/22  153/94 HR 92 light headed 11:00am, 132/94 HR 83 6pm  1/23 139/100 Hr 92 light headed 9:45am, 136/90 HR 79 11am  1/24 141/94 HR 91 light headed 1pm,  1/25 122/83 HR 82 light headed 11am, 1/26  135/91 HR 85 light headed 12pm,  1/27 136/94 HR 81 8am,  1/31  119/72 HR 74 felt nervous 7:30am 2/2 126/90 HR 68 8am  Patient states she was also concerned about her diastolic reading and would like to discuss.

## 2019-04-29 NOTE — Telephone Encounter (Addendum)
Pt called to report her BP readings to Dr. Harrington Challenger... she is only on Atenolol 12.5 mg a day.. she gets very SOB when using her stairs, she has light headedness when her BP is elevated and she is having anxiety since her husband has been sick with pneumonia so she is under a lot of stress but he is back home and they are getting better rest.. she will be getting her second vaccine next week.   She wants Dr. Harrington Challenger to review her BP readings... last OV 11/2018. No headache, chest pain... just worried her BP is getting out of control.   Will forward to Dr. Harrington Challenger for review.    Patient calling with BP readings:  1/21     110/77 HR 81 light headed 12:00pm, 1/22     153/94 HR 92 light headed 11:00am, 132/94 HR 83 6pm  1/23     139/100 Hr 92 light headed 9:45am, 136/90 HR 79 11am  1/24     141/94 HR 91 light headed 1pm,  1/25     122/83 HR 82 light headed 11am, 1/26     135/91 HR 85 light headed 12pm,  1/27     136/94 HR 81 8am,  1/31     119/72 HR 74 felt nervous 7:30am 2/2       126/90 HR 68 8am

## 2019-04-30 NOTE — Telephone Encounter (Signed)
I would continue to follow   Her BP is labile   Last few aren't bad Continue to record/follow

## 2019-05-01 NOTE — Telephone Encounter (Signed)
Left detailed message (DPR) of Dr. Alan Ripper review/recommendations.

## 2019-05-12 DIAGNOSIS — C50912 Malignant neoplasm of unspecified site of left female breast: Secondary | ICD-10-CM | POA: Diagnosis not present

## 2019-06-02 ENCOUNTER — Encounter: Payer: Self-pay | Admitting: Internal Medicine

## 2019-06-02 ENCOUNTER — Ambulatory Visit: Payer: PPO | Admitting: Internal Medicine

## 2019-06-02 ENCOUNTER — Other Ambulatory Visit: Payer: Self-pay

## 2019-06-02 VITALS — BP 126/70 | HR 79 | Ht 63.5 in | Wt 166.0 lb

## 2019-06-02 DIAGNOSIS — I10 Essential (primary) hypertension: Secondary | ICD-10-CM | POA: Diagnosis not present

## 2019-06-02 DIAGNOSIS — Z950 Presence of cardiac pacemaker: Secondary | ICD-10-CM

## 2019-06-02 DIAGNOSIS — I442 Atrioventricular block, complete: Secondary | ICD-10-CM | POA: Diagnosis not present

## 2019-06-02 DIAGNOSIS — I495 Sick sinus syndrome: Secondary | ICD-10-CM

## 2019-06-02 NOTE — Progress Notes (Signed)
HPI Madison [name] returns today for ongoing evaluation of sinus node dysfunction, s/p PPM insertion. She has a h/o carotid stenosis. She has recently found out that her husband has been diagnosed with pacreatic CA. Allergies  Allergen Reactions  . Tape Other (See Comments)    PAPER TAPE -Raw, itching, bleeding skin  . Codeine Nausea And Vomiting and Other (See Comments)  . Latex     Other reaction(s): Other (See Comments) Other  . Other Nausea And Vomiting    [name]  . Oxycodone-Acetaminophen     Other reaction(s): Dizziness (intolerance) Other reaction(s): Dizziness (intolerance) Other reaction(s): Dizziness (intolerance)  . Penicillins Other (See Comments)    Heart palpitations Has patient had a PCN reaction causing immediate rash, facial/tongue/throat swelling, SOB or lightheadedness with hypotension: no Has patient had a PCN reaction causing severe rash involving mucus membranes or skin necrosis: no Has patient had a PCN reaction that required hospitalization yes - caused her to get a heart catheterization Has patient had a PCN reaction occurring within the last 10 years: about 22 years ago If all of the above answers are "NO", then may proceed with C  . Percodan [Oxycodone-Aspirin] Nausea And Vomiting  . Pravastatin Other (See Comments)    Statins cause cramps  . Repatha [Evolocumab]     Flu like symptoms  . Vancomycin Hives    Other reaction(s): Other (See Comments)  . Epinephrine Other (See Comments) and Palpitations    Enhances longevity of Novocaine - severe heart palpatations      Current Outpatient Medications  Medication Sig Dispense Refill  . atenolol (TENORMIN) 25 MG tablet Take 12.5 mg by mouth daily.    Marland Kitchen atorvastatin (LIPITOR) 10 MG tablet Take 10 mg by mouth 3 (three) times a week.    . brimonidine (ALPHAGAN) 0.2 % ophthalmic solution     . cholecalciferol (VITAMIN D3) 25 MCG (1000 UT) tablet Take 1,000 Units by mouth 2 (two) times a week.    .  clonazePAM (KLONOPIN) 0.5 MG tablet Take 1 tablet by mouth 2 (two) times daily as needed for anxiety.  2  . ezetimibe (ZETIA) 10 MG tablet Take 1 tablet (10 mg total) by mouth daily. 30 tablet 11  . fluticasone (FLONASE) 50 MCG/ACT nasal spray Place 1 spray into both nostrils daily as needed for allergies or rhinitis.    Marland Kitchen guaiFENesin (MUCINEX) 600 MG 12 hr tablet Take 600 mg by mouth 2 (two) times daily as needed for cough.     . Multiple Vitamin (MULTIVITAMIN) tablet Take 1 tablet by mouth daily.    . nitroGLYCERIN (NITROSTAT) 0.4 MG SL tablet Place 1 tablet (0.4 mg total) under the tongue every 5 (five) minutes as needed for chest pain. 25 tablet 0  . Polyethyl Glycol-Propyl Glycol (SYSTANE) 0.4-0.3 % SOLN Place 1 drop into both eyes daily as needed (For dry eyes.).     No current facility-administered medications for this visit.     Past Medical History:  Diagnosis Date  . Acquired absence of breast and nipple   . Acute myocardial infarction, unspecified site, episode of care unspecified    1965 and 2015   . ALLERGIC RHINITIS   . Anginal pain ([place])   . Arthritis   . Asthma   . Blindness of left eye    decreased vision in left eye related to ocular occlusion   . Breast cancer ([place])    left mastectomy  . Complication of anesthesia   .  Coronary atherosclerosis   . Cough   . Dysfunction of eustachian tube   . Esophageal reflux   . Heart murmur   . Hemorrhoids   . Hyperlipidemia   . Hypertension   . Peripheral vascular disease ([place])   . Pneumonia    hx of walking pneumonia x 2   . PONV (postoperative nausea and vomiting)   . Presence of permanent cardiac pacemaker   . PVC's (premature ventricular contractions)   . Shortness of breath   . Stress incontinence   . Syncope 11/17/2013    ROS:   All systems reviewed and negative except as noted in the HPI.   Past Surgical History:  Procedure Laterality Date  . ABDOMINAL HYSTERECTOMY  1967  . CARDIAC CATHETERIZATION   03/01/1986   normal coronaries (Dr. Domenic Moras)  . CARDIAC CATHETERIZATION  10/25/2002   normal L main; LAD w/40% narrowing in prox 3rd and 60-70% narrowing beyond 1st diagonal, LAD was tortuous; dominant RCA with 30-40% segmental narrowing and 20-30% narrowing at junction of prox 3rd (Dr. Marella Chimes)  . CARDIAC CATHETERIZATION  04/11/2006   trivial luminal irregularities in coronaries and mid LAD 50% (Dr. Domenic Moras)  . CARDIOPULMONARY MET TEST  05/05/2012   excellent effort w/RER 1.06, peak VO2>100%, peak HR 81%, good functional capacity  . CAROTID DOPPLER  2005   normal study (ordered for swelling & pain, left neck clavicle to ear)  . DILATION AND CURETTAGE OF UTERUS  1962-1968   x4  . GALLBLADDER SURGERY  1985  . MASTECTOMY Left 1981  . MYOMECTOMY  1968  . NM MYOCAR PERF WALL MOTION  2012   bruce myoview -no inducible ischemia, EF 71%, low risk scan  . PERMANENT PACEMAKER INSERTION N/A 11/18/2013   Procedure: PERMANENT PACEMAKER INSERTION;  Surgeon: Evans Lance, MD;  Location: [facility] CATH LAB;  Service: Cardiovascular;  Laterality: N/A;  . PLACEMENT OF BREAST IMPLANTS  1993   [facility]  . REMOVAL OF BILATERAL TISSUE EXPANDERS WITH PLACEMENT OF BILATERAL BREAST IMPLANTS    . TOTAL HIP ARTHROPLASTY Right 08/02/2015   Procedure: RIGHT TOTAL HIP ARTHROPLASTY ANTERIOR APPROACH;  Surgeon: Paralee Cancel, MD;  Location: [facility] ORS;  Service: Orthopedics;  Laterality: Right;  . TRANSTHORACIC ECHOCARDIOGRAM  2014   EF 13-24%, grade 1 diastolic dysfunction; mildly thickened MV leaflets, trivial regurg      Family History  Problem Relation Age of Onset  . Heart attack Brother   . Stroke Brother   . Lymphoma Sister   . Colon cancer Sister   . Heart disease Mother   . Stroke Mother   . Colon cancer Mother 27  . Cancer Mother   . CAD Brother        + stents  . CAD Brother        + stents  . Stroke Brother   . Cervical cancer Sister   . Liver cancer Brother   . Heart disease Maternal Grandmother   .  Stroke Maternal Grandmother   . Heart disease Maternal Grandfather   . Cancer Maternal Grandfather   . Heart disease Paternal Grandmother   . Stroke Paternal Grandfather   . Cancer Sister   . Cancer Brother      Social History   Socioeconomic History  . Marital status: Married    Spouse name: Not on file  . Number of children: 1  . Years of education: Not on file  . Highest education level: Not on file  Occupational History  .  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 Smoking status: Never Smoker  . Smokeless tobacco: Never Used  Substance and Sexual Activity  . Alcohol use: Yes    Comment: 2 glasses of wine with dinner occasional   . Drug use: No  . Sexual activity: Not on file  Other Topics Concern  . Not on file  Social History Narrative  . Not on file   Social Determinants of Health   Financial Resource Strain:   . Difficulty of Paying Living Expenses: Not on file  Food Insecurity:   . Worried About Charity fundraiser in the Last Year: Not on file  . Ran Out of Food in the Last Year: Not on file  Transportation Needs:   . Lack of Transportation (Medical): Not on file  . Lack of Transportation (Non-Medical): Not on file  Physical Activity:   . Days of Exercise per Week: Not on file  . Minutes of Exercise per Session: Not on file  Stress:   . Feeling of Stress : Not on file  Social Connections:   . Frequency of Communication with Friends and Family: Not on file  . Frequency of Social Gatherings with Friends and Family: Not on file  . Attends Religious Services: Not on file  . Active Member of Clubs or Organizations: Not on file  . Attends Archivist Meetings: Not on file  . Marital Status: Not on file  Intimate Partner Violence:   . Fear of Current or Ex-Partner: Not on file  . Emotionally Abused: Not on file  . Physically Abused: Not on file  . Sexually Abused: Not on file     BP 126/70   Pulse 79   Ht 5' 3.5"  (1.613 m)   Wt 166 lb (75.3 kg)   SpO2 97%   BMI 28.94 kg/m   Physical Exam:  Well appearing NAD HEENT: Unremarkable Neck:  No JVD, no thyromegally Lymphatics:  No adenopathy Back:  No CVA tenderness Lungs:  Clear with no wheezes HEART:  Regular rate rhythm, no murmurs, no rubs, no clicks Abd:  soft, positive bowel sounds, no organomegally, no rebound, no guarding Ext:  2 plus pulses, no edema, no cyanosis, no clubbing Skin:  No rashes no nodules Neuro:  CN II through XII intact, motor grossly intact  DEVICE  Normal device function.  See PaceArt for details.   Assess/Plan: 1. Sinus node dysfunction - she is s/p PPM insertion. Her medtronic DDD PM is working normally. 2. HTN - her bp is well controlled.    Mikle Bosworth.D.

## 2019-06-02 NOTE — Patient Instructions (Addendum)
Medication Instructions:  Your physician recommends that you continue on your current medications as directed. Please refer to the Current Medication list given to you today.  Labwork: None ordered.  Testing/Procedures: None ordered.  Follow-Up:  Your physician wants you to follow-up in: June 2021 with Dr. Harrington Challenger.  You will receive a reminder letter in the mail two months in advance. If you don't receive a letter, please call our office to schedule the follow-up appointment.  Your physician wants you to follow-up in: one year with Dr. Lovena Le.   You will receive a reminder letter in the mail two months in advance. If you don't receive a letter, please call our office to schedule the follow-up appointment.  Remote monitoring is used to monitor your Pacemaker from home. This monitoring reduces the number of office visits required to check your device to one time per year. It allows [place] to keep an eye on the functioning of your device to ensure it is working properly. You are scheduled for a device check from home on 07/23/2019. You may send your transmission at any time that day. If you have a wireless device, the transmission will be sent automatically. After your physician reviews your transmission, you will receive a postcard with your next transmission date.  Any Other Special Instructions Will Be Listed Below (If Applicable).  If you need a refill on your cardiac medications before your next appointment, please call your pharmacy.

## 2019-06-15 LAB — CUP PACEART INCLINIC DEVICE CHECK
Battery Impedance: 330 Ohm
Battery Remaining Longevity: 100 mo
Battery Voltage: 2.79 V
Brady Statistic AP VP Percent: 0 %
Brady Statistic AP VS Percent: 95 %
Brady Statistic AS VP Percent: 0 %
Brady Statistic AS VS Percent: 5 %
Date Time Interrogation Session: 20210309153000
Implantable Lead Implant Date: 20150826
Implantable Lead Implant Date: 20150826
Implantable Lead Location: 753859
Implantable Lead Location: 753860
Implantable Lead Model: 5076
Implantable Lead Model: 5076
Implantable Pulse Generator Implant Date: 20150826
Lead Channel Impedance Value: 390 Ohm
Lead Channel Impedance Value: 574 Ohm
Lead Channel Pacing Threshold Amplitude: 0.5 V
Lead Channel Pacing Threshold Amplitude: 0.5 V
Lead Channel Pacing Threshold Pulse Width: 0.4 ms
Lead Channel Pacing Threshold Pulse Width: 0.4 ms
Lead Channel Sensing Intrinsic Amplitude: 11.2 mV
Lead Channel Setting Pacing Amplitude: 2 V
Lead Channel Setting Pacing Amplitude: 2.5 V
Lead Channel Setting Pacing Pulse Width: 0.4 ms
Lead Channel Setting Sensing Sensitivity: 4 mV

## 2019-07-24 ENCOUNTER — Telehealth: Payer: Self-pay

## 2019-07-24 NOTE — Telephone Encounter (Signed)
Spoke with patient to remind of missed remote transmission 

## 2019-08-14 ENCOUNTER — Telehealth: Payer: Self-pay

## 2019-08-14 ENCOUNTER — Ambulatory Visit (INDEPENDENT_AMBULATORY_CARE_PROVIDER_SITE_OTHER): Payer: PPO | Admitting: *Deleted

## 2019-08-14 DIAGNOSIS — I495 Sick sinus syndrome: Secondary | ICD-10-CM

## 2019-08-14 LAB — CUP PACEART REMOTE DEVICE CHECK
Battery Impedance: 355 Ohm
Battery Remaining Longevity: 98 mo
Battery Voltage: 2.79 V
Brady Statistic AP VP Percent: 0 %
Brady Statistic AP VS Percent: 97 %
Brady Statistic AS VP Percent: 0 %
Brady Statistic AS VS Percent: 3 %
Date Time Interrogation Session: 20210521104605
Implantable Lead Implant Date: 20150826
Implantable Lead Implant Date: 20150826
Implantable Lead Location: 753859
Implantable Lead Location: 753860
Implantable Lead Model: 5076
Implantable Lead Model: 5076
Implantable Pulse Generator Implant Date: 20150826
Lead Channel Impedance Value: 406 Ohm
Lead Channel Impedance Value: 615 Ohm
Lead Channel Pacing Threshold Amplitude: 0.5 V
Lead Channel Pacing Threshold Amplitude: 0.625 V
Lead Channel Pacing Threshold Pulse Width: 0.4 ms
Lead Channel Pacing Threshold Pulse Width: 0.4 ms
Lead Channel Setting Pacing Amplitude: 2 V
Lead Channel Setting Pacing Amplitude: 2.5 V
Lead Channel Setting Pacing Pulse Width: 0.4 ms
Lead Channel Setting Sensing Sensitivity: 4 mV

## 2019-08-14 NOTE — Telephone Encounter (Signed)
The pt states she know she missed her transmission and was going to do it today. She mention her husband of 21 years passed away last week. She needs an appointment with Dr. Harrington Challenger as well. I let the pt know she can send the transmission today and I'm so sorry for her loss.

## 2019-08-17 NOTE — Progress Notes (Signed)
Remote pacemaker transmission.   

## 2019-08-25 ENCOUNTER — Other Ambulatory Visit: Payer: Self-pay

## 2019-08-25 ENCOUNTER — Telehealth: Payer: Self-pay

## 2019-08-25 MED ORDER — ATENOLOL 25 MG PO TABS: 12.5000 mg | ORAL_TABLET | Freq: Every day | ORAL | 2 refills | Status: DC

## 2019-08-25 NOTE — Telephone Encounter (Signed)
Pt calling stating that Dr. Harrington Challenger increased her Atenolol for pt to take 1/2 tablet BID, this change is not on pt's medication list and pt wanted to let Dr. Harrington Challenger know that her husband has passed away and her BP has been up and down and that is why she was told that she could take this medication BID. Please address

## 2019-08-25 NOTE — Telephone Encounter (Signed)
Left message to call back.  Scheduled patient 6/16. Will keep or cancel depending on what the patient says when she calls back.

## 2019-08-25 NOTE — Telephone Encounter (Signed)
Spoke with pt today. She states her BP has been "all over the place" since her husband died last week. She states she takes Atenolol 12.5mg  bid. Her order currently states 12.5 qd, but it was verbalized to her from Dr. Lovena Le that "I can take an extra half pill in the evening when my pressure is high." She states she has been taking it bid for "a while" now. She needs a refill and would like it refilled for bid vs qd. I advised her I did not see an order for bid, but will forward to Dr. Harrington Challenger to approve. In the meantime, I will send in a refill for her current order.  Secondly, she feels she needs to see Dr. Harrington Challenger soon to discuss recent life events and her blood pressure. I did not see any open availability, but will forward to Dr. Alan Ripper RN to review. She understands she will not have a return phone call until, at least, next week.  She agreed with plan and had no additional questions.

## 2019-08-25 NOTE — Telephone Encounter (Signed)
OK to take atenolol 2x per day Have her added on to see me on 09/09/19

## 2019-08-27 MED ORDER — ATENOLOL 25 MG PO TABS: 12.5000 mg | ORAL_TABLET | Freq: Two times a day (BID) | ORAL | 3 refills | Status: DC

## 2019-08-27 NOTE — Telephone Encounter (Signed)
Reviewed new medication orders and upcoming appt with Madison [name]. She understands Dr. Harrington Challenger would like to see her on 6/16. Unfortunately she has an appt with the social security office at 2:30 that day and will not be able to make her scheduled time. I advised her I would keep her name on the schedule for now and ask Dr. Alan Ripper RN to follow up with rescheduling options.

## 2019-09-08 NOTE — Progress Notes (Signed)
Cardiology Office Note   Date:  09/09/2019   ID:  Madison [name], DOB November 27, 1933, MRN [id number]  PCP:  Crist Infante, MD  Cardiologist:   Dorris Carnes, MD   F/U of HTN and CAD    History of Present Illness: Madison [name] is a 86 y.o. female with a history of mild to mod CAD  Myovue in 2015 normal Echo showed normal LVEF Cardopulmonary stress test consistent with deconditioning.   The pt also has hx of HTN, PVCs, GERD, HL  And she is  s/p PPM in 2015 for SN dysfunction    I last saw her in clinic in Fall 2020   She had some lightheadedness IN the interval she has been seen by Beckie Salts    The pt called in recently and said her BP was all over the place  Taking extra atenolol   The pt has been under significant stress as he husband died recently of pancreatic Ca   Current Meds  Medication Sig  . atenolol (TENORMIN) 25 MG tablet Take 0.5 tablets (12.5 mg total) by mouth 2 (two) times daily.  Marland Kitchen atorvastatin (LIPITOR) 10 MG tablet Take 10 mg by mouth 3 (three) times a week.  . brimonidine (ALPHAGAN) 0.2 % ophthalmic solution   . clonazePAM (KLONOPIN) 0.5 MG tablet Take 1 tablet by mouth 2 (two) times daily as needed for anxiety.  Marland Kitchen ezetimibe (ZETIA) 10 MG tablet Take 1 tablet (10 mg total) by mouth daily.  . fluticasone (FLONASE) 50 MCG/ACT nasal spray Place 1 spray into both nostrils daily as needed for allergies or rhinitis.  Marland Kitchen guaiFENesin (MUCINEX) 600 MG 12 hr tablet Take 600 mg by mouth 2 (two) times daily as needed for cough.   . Multiple Vitamin (MULTIVITAMIN) tablet Take 1 tablet by mouth daily.  . nitroGLYCERIN (NITROSTAT) 0.4 MG SL tablet Place 1 tablet (0.4 mg total) under the tongue every 5 (five) minutes as needed for chest pain.  Vladimir Faster Glycol-Propyl Glycol (SYSTANE) 0.4-0.3 % SOLN Place 1 drop into both eyes daily as needed (For dry eyes.).     Allergies:   Tape, Codeine, Latex, Other, Oxycodone-acetaminophen, Penicillins, Percodan [oxycodone-aspirin], Pravastatin,  Repatha [evolocumab], Vancomycin, and Epinephrine   Past Medical History:  Diagnosis Date  . Acquired absence of breast and nipple   . Acute myocardial infarction, unspecified site, episode of care unspecified    1965 and 2015   . ALLERGIC RHINITIS   . Anginal pain ([place])   . Arthritis   . Asthma   . Blindness of left eye    decreased vision in left eye related to ocular occlusion   . Breast cancer ([place])    left mastectomy  . Complication of anesthesia   . Coronary atherosclerosis   . Cough   . Dysfunction of eustachian tube   . Esophageal reflux   . Heart murmur   . Hemorrhoids   . Hyperlipidemia   . Hypertension   . Peripheral vascular disease ([place])   . Pneumonia    hx of walking pneumonia x 2   . PONV (postoperative nausea and vomiting)   . Presence of permanent cardiac pacemaker   . PVC's (premature ventricular contractions)   . Shortness of breath   . Stress incontinence   . Syncope 11/17/2013    Past Surgical History:  Procedure Laterality Date  . ABDOMINAL HYSTERECTOMY  1967  . CARDIAC CATHETERIZATION  03/01/1986   normal coronaries (Dr. Domenic Moras)  . CARDIAC CATHETERIZATION  10/25/2002   normal L main; LAD w/40% narrowing in prox 3rd and 60-70% narrowing beyond 1st diagonal, LAD was tortuous; dominant RCA with 30-40% segmental narrowing and 20-30% narrowing at junction of prox 3rd (Dr. Marella Chimes)  . CARDIAC CATHETERIZATION  04/11/2006   trivial luminal irregularities in coronaries and mid LAD 50% (Dr. Domenic Moras)  . CARDIOPULMONARY MET TEST  05/05/2012   excellent effort w/RER 1.06, peak VO2>100%, peak HR 81%, good functional capacity  . CAROTID DOPPLER  2005   normal study (ordered for swelling & pain, left neck clavicle to ear)  . DILATION AND CURETTAGE OF UTERUS  1962-1968   x4  . GALLBLADDER SURGERY  1985  . MASTECTOMY Left 1981  . MYOMECTOMY  1968  . NM MYOCAR PERF WALL MOTION  2012   bruce myoview -no inducible ischemia, EF 71%, low risk scan  .  PERMANENT PACEMAKER INSERTION N/A 11/18/2013   Procedure: PERMANENT PACEMAKER INSERTION;  Surgeon: Evans Lance, MD;  Location: [facility] CATH LAB;  Service: Cardiovascular;  Laterality: N/A;  . PLACEMENT OF BREAST IMPLANTS  1993   [facility]  . REMOVAL OF BILATERAL TISSUE EXPANDERS WITH PLACEMENT OF BILATERAL BREAST IMPLANTS    . TOTAL HIP ARTHROPLASTY Right 08/02/2015   Procedure: RIGHT TOTAL HIP ARTHROPLASTY ANTERIOR APPROACH;  Surgeon: Paralee Cancel, MD;  Location: [facility] ORS;  Service: Orthopedics;  Laterality: Right;  . TRANSTHORACIC ECHOCARDIOGRAM  2014   EF 61-95%, grade 1 diastolic dysfunction; mildly thickened MV leaflets, trivial regurg      Social History:  The patient  reports that she has never smoked. She has never used smokeless tobacco. She reports current alcohol use. She reports that she does not use drugs.   Family History:  The patient's family history includes CAD in her brother and brother; Cancer in her brother, maternal grandfather, mother, and sister; Cervical cancer in her sister; Colon cancer in her sister; Colon cancer (age of onset: 82) in her mother; Heart attack in her brother; Heart disease in her maternal grandfather, maternal grandmother, mother, and paternal grandmother; Liver cancer in her brother; Lymphoma in her sister; Stroke in her brother, brother, maternal grandmother, mother, and paternal grandfather.    ROS:  Please see the history of present illness. All other systems are reviewed and  Negative to the above problem except as noted.    PHYSICAL EXAM: VS:  BP 128/82   Pulse 65   Ht 5' 3.5" (1.613 m)   Wt 165 lb 6.4 oz (75 kg)   SpO2 96%   BMI 28.84 kg/m     GEN: Well nourished, well developed, in no acute distress  HEENT: normal  Neck: no JVD, carotid bruits Cardiac: RRR; no murmurs, rubs, or gallops,no LEedema  Respiratory:  clear to auscultation bilaterally, normal work of breathing GI: soft, nontender, nondistended, + BS  No hepatomegaly  MS: no  deformity Moving all extremities   Skin: warm and dry, no rash Neuro:  Strength and sensation are intact Psych: euthymic mood, full affect   EKG:  EKG is not ordered today.   Lipid Panel    Component Value Date/Time   CHOL 176 10/28/2014 0440   TRIG 126 10/28/2014 0440   HDL 42 10/28/2014 0440   CHOLHDL 4.2 10/28/2014 0440   VLDL 25 10/28/2014 0440   LDLCALC 109 (H) 10/28/2014 0440      Wt Readings from Last 3 Encounters:  09/09/19 165 lb 6.4 oz (75 kg)  06/02/19 166 lb (75.3 kg)  12/19/18 170  lb (77.1 kg)      ASSESSMENT AND PLAN:  1  HTN  Pt's BP has been up   The last couple weeks of readings are not too bad   She has taken an extra b blocker as needed    I agree with this   There hav e been no extremely high valvues     2  Hx dizziness   Pt denies symptoms    3  CAD  No symptoms to suggest angina  4  HL   LDL 67  HDL 52   Continue current regimen   Current medicines are reviewed at length with the patient today.  The patient does not have concerns regarding medicines.  Signed, Dorris Carnes, MD  09/09/2019 8:18 AM    [address], [place], [place]  [address] Phone: [phone]; Fax: [phone]

## 2019-09-09 ENCOUNTER — Encounter: Payer: Self-pay | Admitting: Internal Medicine

## 2019-09-09 ENCOUNTER — Other Ambulatory Visit: Payer: Self-pay

## 2019-09-09 ENCOUNTER — Ambulatory Visit: Payer: PPO | Admitting: Internal Medicine

## 2019-09-09 VITALS — BP 128/82 | HR 65 | Ht 63.5 in | Wt 165.4 lb

## 2019-09-09 DIAGNOSIS — I10 Essential (primary) hypertension: Secondary | ICD-10-CM | POA: Diagnosis not present

## 2019-09-09 NOTE — Patient Instructions (Signed)
Medication Instructions:  No changes *If you need a refill on your cardiac medications before your next appointment, please call your pharmacy*   Lab Work: none If you have labs (blood work) drawn today and your tests are completely normal, you will receive your results only by: Marland Kitchen MyChart Message (if you have MyChart) OR . A paper copy in the mail If you have any lab test that is abnormal or we need to change your treatment, we will call you to review the results.   Testing/Procedures: none   Follow-Up: We will contact you regarding follow up.

## 2019-09-24 DIAGNOSIS — H401131 Primary open-angle glaucoma, bilateral, mild stage: Secondary | ICD-10-CM | POA: Diagnosis not present

## 2019-09-24 DIAGNOSIS — H25812 Combined forms of age-related cataract, left eye: Secondary | ICD-10-CM | POA: Diagnosis not present

## 2019-09-24 DIAGNOSIS — H34812 Central retinal vein occlusion, left eye, with macular edema: Secondary | ICD-10-CM | POA: Diagnosis not present

## 2019-10-06 DIAGNOSIS — Z1231 Encounter for screening mammogram for malignant neoplasm of breast: Secondary | ICD-10-CM | POA: Diagnosis not present

## 2019-10-08 DIAGNOSIS — E7849 Other hyperlipidemia: Secondary | ICD-10-CM | POA: Diagnosis not present

## 2019-10-08 DIAGNOSIS — R7301 Impaired fasting glucose: Secondary | ICD-10-CM | POA: Diagnosis not present

## 2019-10-08 DIAGNOSIS — M859 Disorder of bone density and structure, unspecified: Secondary | ICD-10-CM | POA: Diagnosis not present

## 2019-10-08 DIAGNOSIS — E538 Deficiency of other specified B group vitamins: Secondary | ICD-10-CM | POA: Diagnosis not present

## 2019-10-13 DIAGNOSIS — K635 Polyp of colon: Secondary | ICD-10-CM | POA: Diagnosis not present

## 2019-10-13 DIAGNOSIS — R519 Headache, unspecified: Secondary | ICD-10-CM | POA: Diagnosis not present

## 2019-10-13 DIAGNOSIS — J45909 Unspecified asthma, uncomplicated: Secondary | ICD-10-CM | POA: Diagnosis not present

## 2019-10-13 DIAGNOSIS — N1831 Chronic kidney disease, stage 3a: Secondary | ICD-10-CM | POA: Diagnosis not present

## 2019-10-13 DIAGNOSIS — I2510 Atherosclerotic heart disease of native coronary artery without angina pectoris: Secondary | ICD-10-CM | POA: Diagnosis not present

## 2019-10-13 DIAGNOSIS — Z950 Presence of cardiac pacemaker: Secondary | ICD-10-CM | POA: Diagnosis not present

## 2019-10-13 DIAGNOSIS — Z0000 Encounter for general adult medical examination without abnormal findings: Secondary | ICD-10-CM | POA: Diagnosis not present

## 2019-10-13 DIAGNOSIS — I7090 Unspecified atherosclerosis: Secondary | ICD-10-CM | POA: Diagnosis not present

## 2019-10-13 DIAGNOSIS — I10 Essential (primary) hypertension: Secondary | ICD-10-CM | POA: Diagnosis not present

## 2019-10-13 DIAGNOSIS — Z1331 Encounter for screening for depression: Secondary | ICD-10-CM | POA: Diagnosis not present

## 2019-10-13 DIAGNOSIS — R251 Tremor, unspecified: Secondary | ICD-10-CM | POA: Diagnosis not present

## 2019-10-13 DIAGNOSIS — M6740 Ganglion, unspecified site: Secondary | ICD-10-CM | POA: Diagnosis not present

## 2019-10-13 DIAGNOSIS — M1611 Unilateral primary osteoarthritis, right hip: Secondary | ICD-10-CM | POA: Diagnosis not present

## 2019-10-13 DIAGNOSIS — R82998 Other abnormal findings in urine: Secondary | ICD-10-CM | POA: Diagnosis not present

## 2019-10-13 DIAGNOSIS — I6529 Occlusion and stenosis of unspecified carotid artery: Secondary | ICD-10-CM | POA: Diagnosis not present

## 2019-10-23 ENCOUNTER — Other Ambulatory Visit: Payer: Self-pay | Admitting: Internal Medicine

## 2019-11-11 DIAGNOSIS — L814 Other melanin hyperpigmentation: Secondary | ICD-10-CM | POA: Diagnosis not present

## 2019-11-11 DIAGNOSIS — L570 Actinic keratosis: Secondary | ICD-10-CM | POA: Diagnosis not present

## 2019-11-11 DIAGNOSIS — D485 Neoplasm of uncertain behavior of skin: Secondary | ICD-10-CM | POA: Diagnosis not present

## 2019-11-11 DIAGNOSIS — L820 Inflamed seborrheic keratosis: Secondary | ICD-10-CM | POA: Diagnosis not present

## 2019-11-13 ENCOUNTER — Ambulatory Visit (INDEPENDENT_AMBULATORY_CARE_PROVIDER_SITE_OTHER): Payer: PPO | Admitting: *Deleted

## 2019-11-13 DIAGNOSIS — I495 Sick sinus syndrome: Secondary | ICD-10-CM | POA: Diagnosis not present

## 2019-11-16 DIAGNOSIS — I10 Essential (primary) hypertension: Secondary | ICD-10-CM | POA: Diagnosis not present

## 2019-11-16 LAB — CUP PACEART REMOTE DEVICE CHECK
Battery Impedance: 380 Ohm
Battery Remaining Longevity: 97 mo
Battery Voltage: 2.79 V
Brady Statistic AP VP Percent: 0 %
Brady Statistic AP VS Percent: 97 %
Brady Statistic AS VP Percent: 0 %
Brady Statistic AS VS Percent: 3 %
Date Time Interrogation Session: 20210820164755
Implantable Lead Implant Date: 20150826
Implantable Lead Implant Date: 20150826
Implantable Lead Location: 753859
Implantable Lead Location: 753860
Implantable Lead Model: 5076
Implantable Lead Model: 5076
Implantable Pulse Generator Implant Date: 20150826
Lead Channel Impedance Value: 440 Ohm
Lead Channel Impedance Value: 560 Ohm
Lead Channel Pacing Threshold Amplitude: 0.5 V
Lead Channel Pacing Threshold Amplitude: 0.5 V
Lead Channel Pacing Threshold Pulse Width: 0.4 ms
Lead Channel Pacing Threshold Pulse Width: 0.4 ms
Lead Channel Setting Pacing Amplitude: 2 V
Lead Channel Setting Pacing Amplitude: 2.5 V
Lead Channel Setting Pacing Pulse Width: 0.4 ms
Lead Channel Setting Sensing Sensitivity: 4 mV

## 2019-11-16 NOTE — Progress Notes (Signed)
Remote pacemaker transmission.   

## 2019-12-23 DIAGNOSIS — L821 Other seborrheic keratosis: Secondary | ICD-10-CM | POA: Diagnosis not present

## 2019-12-23 DIAGNOSIS — L570 Actinic keratosis: Secondary | ICD-10-CM | POA: Diagnosis not present

## 2020-01-07 DIAGNOSIS — H401131 Primary open-angle glaucoma, bilateral, mild stage: Secondary | ICD-10-CM | POA: Diagnosis not present

## 2020-01-07 DIAGNOSIS — H5201 Hypermetropia, right eye: Secondary | ICD-10-CM | POA: Diagnosis not present

## 2020-01-09 DIAGNOSIS — Z23 Encounter for immunization: Secondary | ICD-10-CM | POA: Diagnosis not present

## 2020-02-12 ENCOUNTER — Ambulatory Visit (INDEPENDENT_AMBULATORY_CARE_PROVIDER_SITE_OTHER): Payer: PPO

## 2020-02-12 DIAGNOSIS — I495 Sick sinus syndrome: Secondary | ICD-10-CM | POA: Diagnosis not present

## 2020-02-15 ENCOUNTER — Telehealth: Payer: Self-pay

## 2020-02-15 NOTE — Telephone Encounter (Signed)
LMOVM letting pt know I received her message and it is okay for her to transmit today.

## 2020-02-16 LAB — CUP PACEART REMOTE DEVICE CHECK
Battery Impedance: 454 Ohm
Battery Remaining Longevity: 90 mo
Battery Voltage: 2.79 V
Brady Statistic AP VP Percent: 0 %
Brady Statistic AP VS Percent: 98 %
Brady Statistic AS VP Percent: 0 %
Brady Statistic AS VS Percent: 2 %
Date Time Interrogation Session: 20211122153915
Implantable Lead Implant Date: 20150826
Implantable Lead Implant Date: 20150826
Implantable Lead Location: 753859
Implantable Lead Location: 753860
Implantable Lead Model: 5076
Implantable Lead Model: 5076
Implantable Pulse Generator Implant Date: 20150826
Lead Channel Impedance Value: 416 Ohm
Lead Channel Impedance Value: 594 Ohm
Lead Channel Pacing Threshold Amplitude: 0.5 V
Lead Channel Pacing Threshold Amplitude: 0.625 V
Lead Channel Pacing Threshold Pulse Width: 0.4 ms
Lead Channel Pacing Threshold Pulse Width: 0.4 ms
Lead Channel Setting Pacing Amplitude: 2 V
Lead Channel Setting Pacing Amplitude: 2.5 V
Lead Channel Setting Pacing Pulse Width: 0.4 ms
Lead Channel Setting Sensing Sensitivity: 4 mV

## 2020-02-16 NOTE — Progress Notes (Signed)
Remote pacemaker transmission.   

## 2020-03-03 ENCOUNTER — Telehealth: Payer: Self-pay | Admitting: *Deleted

## 2020-03-03 DIAGNOSIS — Z800 Family history of malignant neoplasm of digestive organs: Secondary | ICD-10-CM | POA: Diagnosis not present

## 2020-03-03 DIAGNOSIS — Z1211 Encounter for screening for malignant neoplasm of colon: Secondary | ICD-10-CM | POA: Diagnosis not present

## 2020-03-03 DIAGNOSIS — K219 Gastro-esophageal reflux disease without esophagitis: Secondary | ICD-10-CM | POA: Diagnosis not present

## 2020-03-03 DIAGNOSIS — K5730 Diverticulosis of large intestine without perforation or abscess without bleeding: Secondary | ICD-10-CM | POA: Diagnosis not present

## 2020-03-03 DIAGNOSIS — R194 Change in bowel habit: Secondary | ICD-10-CM | POA: Diagnosis not present

## 2020-03-03 NOTE — Telephone Encounter (Signed)
   [facility] Pre-operative Risk Assessment    HEARTCARE STAFF: - Please ensure there is not already an duplicate clearance open for this procedure. - Under Visit Info/Reason for Call, type in Other and utilize the format Clearance MM/DD/YY or Clearance TBD. Do not use dashes or single digits. - If request is for dental extraction, please clarify the # of teeth to be extracted.  Request for surgical clearance:  1. What type of surgery is being performed? COLONOSCOPY   2. When is this surgery scheduled? TBD   3. What type of clearance is required (medical clearance vs. Pharmacy clearance to hold med vs. Both)? MEDICAL  4. Are there any medications that need to be held prior to surgery and how long? NONE LISTED   5. Practice name and name of physician performing surgery? [facility]; DR. MANN   6. What is the office phone number? [phone]   7.   What is the office fax number? [phone]  8.   Anesthesia type (None, local, MAC, general) ? PROPOFOL    Julaine Hua 03/03/2020, 4:06 PM  _________________________________________________________________   (provider comments below)

## 2020-03-04 NOTE — Telephone Encounter (Signed)
I do not see anything sooner with Dr Harrington Challenger either

## 2020-03-04 NOTE — Telephone Encounter (Signed)
Forwarded to requesting providers office 

## 2020-03-04 NOTE — Telephone Encounter (Signed)
I tried to call patient today to complete pre-op risk assessment. She reported being very concerned about having another colonoscopy and has not decided if she is going to have it or not. When asked how she has been doing from a heart standpoint, she reports I don't know. She is very concerned that she has not had any specific cardiac work-up over the last several years besides a EKG. I tried to reassure her that we typically do not have to order other tests if patient has been doing well. I reviewed her last Echo from 2016 with her because she was worried about her EF. It does not sound like she is having any significant cardiac symptoms and is more just concerned about her general heart health and would like to discuss this more. Therefore, offered office visit. Offered visit with APP in early January but patient states she wants to see Dr. Harrington Challenger. First available appointment with Dr. Harrington Challenger is not until 04/25/2020. Patient states this is fine.   Pre-op covering staff, can you just make sure that there is not anything with Dr. Harrington Challenger sooner than this that I missed? Can you also please notify requesting surgeon's office of patient's desire to have this appointment before deciding on whether or not she wants to have her colonoscopy?  Thank you!

## 2020-04-25 ENCOUNTER — Ambulatory Visit: Payer: PPO | Admitting: Internal Medicine

## 2020-04-25 ENCOUNTER — Other Ambulatory Visit: Payer: Self-pay

## 2020-04-25 ENCOUNTER — Encounter: Payer: Self-pay | Admitting: Internal Medicine

## 2020-04-25 VITALS — BP 120/84 | HR 66 | Ht 63.5 in | Wt 164.0 lb

## 2020-04-25 DIAGNOSIS — R079 Chest pain, unspecified: Secondary | ICD-10-CM

## 2020-04-25 DIAGNOSIS — I6529 Occlusion and stenosis of unspecified carotid artery: Secondary | ICD-10-CM

## 2020-04-25 NOTE — Patient Instructions (Signed)
Medication Instructions:  No changes *If you need a refill on your cardiac medications before your next appointment, please call your pharmacy*   Lab Work: none   Testing/Procedures: Your physician has requested that you have a carotid duplex. This test is an ultrasound of the carotid arteries in your neck. It looks at blood flow through these arteries that supply the brain with blood. Allow one hour for this exam. There are no restrictions or special instructions.  Your physician has requested that you have an echocardiogram. Echocardiography is a painless test that uses sound waves to create images of your heart. It provides your doctor with information about the size and shape of your heart and how well your heart's chambers and valves are working. This procedure takes approximately one hour. There are no restrictions for this procedure.   Follow-Up: At [facility], you and your health needs are our priority.  As part of our continuing mission to provide you with exceptional heart care, we have created designated Provider Care Teams.  These Care Teams include your primary Cardiologist (physician) and Advanced Practice Providers (APPs -  Physician Assistants and Nurse Practitioners) who all work together to provide you with the care you need, when you need it.  We recommend signing up for the patient portal called "MyChart".  Sign up information is provided on this After Visit Summary.  MyChart is used to connect with patients for Virtual Visits (Telemedicine).  Patients are able to view lab/test results, encounter notes, upcoming appointments, etc.  Non-urgent messages can be sent to your provider as well.   To learn more about what you can do with MyChart, go to [email or web address].    Your next appointment:   6-8 month(s)  The format for your next appointment:   In Person  Provider:   You may see Dorris Carnes, MD or one of the following Advanced Practice Providers on your  designated Care Team:    Richardson Dopp, PA-C  Robbie Lis, [place]   Other Instructions

## 2020-04-25 NOTE — Progress Notes (Addendum)
 Cardiology Office Note   Date:  04/25/2020   ID:  Madison [name], DOB 10/22/1933, MRN [id number]  PCP:  Perini, Mark, MD  Cardiologist:   Paula Ross, MD   F/U of HTN and CAD    History of Present Illness: Madison [name] is a 86 y.o. female with a history of mild to mod CAD  Myovue in 2015 normal Echo showed normal LVEF Cardopulmonary stress test consistent with deconditioning.   The pt also has hx of HTN, PVCs, GERD, HL  And she is  s/p PPM in 2015 for SN dysfunction     SInce I saw her she has done well from  A cardiac standpoint  Her breathing is good  She denies CP   Notes occasional dizziness if she stands quickly   Being evaluated for colonoscopy    COncerned about pumping function (LVEF) and carotids   Allergies:   Tape, Codeine, Latex, Other, Oxycodone-acetaminophen, Penicillins, Percodan [oxycodone-aspirin], Pravastatin, Repatha [evolocumab], Vancomycin, and Epinephrine   Past Medical History:  Diagnosis Date  . Acquired absence of breast and nipple   . Acute myocardial infarction, unspecified site, episode of care unspecified    1965 and 2015   . ALLERGIC RHINITIS   . Anginal pain ([facility])   . Arthritis   . Asthma   . Blindness of left eye    decreased vision in left eye related to ocular occlusion   . Breast cancer ([facility])    left mastectomy  . Complication of anesthesia   . Coronary atherosclerosis   . Cough   . Dysfunction of eustachian tube   . Esophageal reflux   . Heart murmur   . Hemorrhoids   . Hyperlipidemia   . Hypertension   . Peripheral vascular disease ([facility])   . Pneumonia    hx of walking pneumonia x 2   . PONV (postoperative nausea and vomiting)   . Presence of permanent cardiac pacemaker   . PVC's (premature ventricular contractions)   . Shortness of breath   . Stress incontinence   . Syncope 11/17/2013    Past Surgical History:  Procedure Laterality Date  . ABDOMINAL HYSTERECTOMY  1967  . CARDIAC CATHETERIZATION  03/01/1986   normal  coronaries (Dr. W. Gamble)  . CARDIAC CATHETERIZATION  10/25/2002   normal L main; LAD w/40% narrowing in prox 3rd and 60-70% narrowing beyond 1st diagonal, LAD was tortuous; dominant RCA with 30-40% segmental narrowing and 20-30% narrowing at junction of prox 3rd (Dr. R. Weintraub)  . CARDIAC CATHETERIZATION  04/11/2006   trivial luminal irregularities in coronaries and mid LAD 50% (Dr. W. Gamble)  . CARDIOPULMONARY MET TEST  05/05/2012   excellent effort w/RER 1.06, peak VO2>100%, peak HR 81%, good functional capacity  . CAROTID DOPPLER  2005   normal study (ordered for swelling & pain, left neck clavicle to ear)  . DILATION AND CURETTAGE OF UTERUS  1962-1968   x4  . GALLBLADDER SURGERY  1985  . MASTECTOMY Left 1981  . MYOMECTOMY  1968  . NM MYOCAR PERF WALL MOTION  2012   bruce myoview -no inducible ischemia, EF 71%, low risk scan  . PERMANENT PACEMAKER INSERTION N/A 11/18/2013   Procedure: PERMANENT PACEMAKER INSERTION;  Surgeon: Gregg W Taylor, MD;  Location: [facility] CATH LAB;  Service: Cardiovascular;  Laterality: N/A;  . PLACEMENT OF BREAST IMPLANTS  1993   [facility]  . REMOVAL OF BILATERAL TISSUE EXPANDERS WITH PLACEMENT OF BILATERAL BREAST IMPLANTS    . TOTAL HIP   ARTHROPLASTY Right 08/02/2015   Procedure: RIGHT TOTAL HIP ARTHROPLASTY ANTERIOR APPROACH;  Surgeon: Paralee Cancel, MD;  Location: [facility] ORS;  Service: Orthopedics;  Laterality: Right;  . TRANSTHORACIC ECHOCARDIOGRAM  2014   EF 43-15%, grade 1 diastolic dysfunction; mildly thickened MV leaflets, trivial regurg      Social History:  The patient  reports that she has never smoked. She has never used smokeless tobacco. She reports current alcohol use. She reports that she does not use drugs.   Family History:  The patient's family history includes CAD in her brother and brother; Cancer in her brother, maternal grandfather, mother, and sister; Cervical cancer in her sister; Colon cancer in her sister; Colon cancer (age of onset: 85) in her  mother; Heart attack in her brother; Heart disease in her maternal grandfather, maternal grandmother, mother, and paternal grandmother; Liver cancer in her brother; Lymphoma in her sister; Stroke in her brother, brother, maternal grandmother, mother, and paternal grandfather.    ROS:  Please see the history of present illness. All other systems are reviewed and  Negative to the above problem except as noted.    PHYSICAL EXAM: VS:  BP 120/84   Pulse 66   Ht 5' 3.5" (1.613 m)   Wt 164 lb (74.4 kg)   BMI 28.60 kg/m     GEN: Well nourished, well developed, in no acute distress  HEENT: normal  Neck: no JVD, carotid bruits Cardiac: RRR; no murmurs, rubs, or gallops,no LEedema  Respiratory:  clear to auscultation bilaterally,  GI: soft, nontender, nondistended, + BS  No hepatomegaly  MS: no deformity Moving all extremities   Skin: warm and dry, no rash Neuro:  Strength and sensation are intact Psych: euthymic mood, full affect   EKG:  EKG is  ordered today.  Atrial paced  66 abpm  Firs degre AV block  AQ waves V1 to V3   Lipid Panel    Component Value Date/Time   CHOL 176 10/28/2014 0440   TRIG 126 10/28/2014 0440   HDL 42 10/28/2014 0440   CHOLHDL 4.2 10/28/2014 0440   VLDL 25 10/28/2014 0440   LDLCALC 109 (H) 10/28/2014 0440      Wt Readings from Last 3 Encounters:  04/25/20 164 lb (74.4 kg)  09/09/19 165 lb 6.4 oz (75 kg)  06/02/19 166 lb (75.3 kg)      ASSESSMENT AND PLAN:  1  HTN  BP is controlled  Would continue to follow  NO changes   2 Hx of CAD  No symptoms to sugg angina   3  Hx PPM   Rare dizzienss   WIll get echo to reassess LVEF  4  Hx dizziness   Rare, with standing   5  Lipids  COntinue to follow   6  CV dz   Mild at [facility] in past   WIll set up for f/u  From a cardiac standpoint I think pt is at low risk for cardiac complication from colonscopy and OK to proceed    Current medicines are reviewed at length with the patient today.  The patient  does not have concerns regarding medicines.  Signed, Dorris Carnes, MD  04/25/2020 6:31 PM    [address], [place], [place]  [address] Phone: [phone]; Fax: [phone]

## 2020-05-05 DIAGNOSIS — E785 Hyperlipidemia, unspecified: Secondary | ICD-10-CM | POA: Diagnosis not present

## 2020-05-05 DIAGNOSIS — F4322 Adjustment disorder with anxiety: Secondary | ICD-10-CM | POA: Diagnosis not present

## 2020-05-05 DIAGNOSIS — K219 Gastro-esophageal reflux disease without esophagitis: Secondary | ICD-10-CM | POA: Diagnosis not present

## 2020-05-05 DIAGNOSIS — Z950 Presence of cardiac pacemaker: Secondary | ICD-10-CM | POA: Diagnosis not present

## 2020-05-05 DIAGNOSIS — R7301 Impaired fasting glucose: Secondary | ICD-10-CM | POA: Diagnosis not present

## 2020-05-05 DIAGNOSIS — I2510 Atherosclerotic heart disease of native coronary artery without angina pectoris: Secondary | ICD-10-CM | POA: Diagnosis not present

## 2020-05-05 DIAGNOSIS — I10 Essential (primary) hypertension: Secondary | ICD-10-CM | POA: Diagnosis not present

## 2020-05-05 DIAGNOSIS — J45909 Unspecified asthma, uncomplicated: Secondary | ICD-10-CM | POA: Diagnosis not present

## 2020-05-05 DIAGNOSIS — E538 Deficiency of other specified B group vitamins: Secondary | ICD-10-CM | POA: Diagnosis not present

## 2020-05-06 ENCOUNTER — Ambulatory Visit (HOSPITAL_COMMUNITY)
Admission: RE | Admit: 2020-05-06 | Discharge: 2020-05-06 | Disposition: A | Discharge status: Home / Self Care | Payer: PPO | Source: Ambulatory Visit | Attending: Cardiovascular Disease | Admitting: Cardiovascular Disease

## 2020-05-06 ENCOUNTER — Other Ambulatory Visit: Payer: Self-pay

## 2020-05-06 DIAGNOSIS — R079 Chest pain, unspecified: Secondary | ICD-10-CM | POA: Diagnosis not present

## 2020-05-06 DIAGNOSIS — I6529 Occlusion and stenosis of unspecified carotid artery: Secondary | ICD-10-CM | POA: Diagnosis not present

## 2020-05-10 ENCOUNTER — Other Ambulatory Visit: Payer: Self-pay | Admitting: Internal Medicine

## 2020-05-10 DIAGNOSIS — E041 Nontoxic single thyroid nodule: Secondary | ICD-10-CM

## 2020-05-13 ENCOUNTER — Other Ambulatory Visit: Payer: Self-pay

## 2020-05-13 ENCOUNTER — Ambulatory Visit (HOSPITAL_COMMUNITY): Payer: PPO | Attending: Internal Medicine

## 2020-05-13 ENCOUNTER — Telehealth: Payer: Self-pay

## 2020-05-13 DIAGNOSIS — R079 Chest pain, unspecified: Secondary | ICD-10-CM

## 2020-05-13 DIAGNOSIS — I6529 Occlusion and stenosis of unspecified carotid artery: Secondary | ICD-10-CM | POA: Diagnosis not present

## 2020-05-13 LAB — ECHOCARDIOGRAM COMPLETE
Area-P 1/2: 2.66 cm2
S' Lateral: 2.1 cm

## 2020-05-13 NOTE — Telephone Encounter (Signed)
The patient having trouble sending. I gave her the number to tech support to get additional help.

## 2020-05-13 NOTE — Telephone Encounter (Signed)
Patient reports her monitor read code 3230. Advised patient to leave the hand held device on the monitor box for 1 hour try to resend. If it does not call the device clinic and we will get a new monitor ordered.

## 2020-05-16 NOTE — Telephone Encounter (Signed)
LMOVM for the patient to help her trouble shoot her monitor. I had [employer] on the phone as well. [employer] is going to call her as well to try to help her with her monitor.

## 2020-05-19 ENCOUNTER — Telehealth: Payer: Self-pay

## 2020-05-19 NOTE — Telephone Encounter (Signed)
The patient is receiving a new handheld and a new [employer] ID card.

## 2020-05-19 NOTE — Telephone Encounter (Signed)
I called [employer] with the patient on the phone.

## 2020-05-23 ENCOUNTER — Other Ambulatory Visit: Payer: PPO

## 2020-05-23 DIAGNOSIS — Z1211 Encounter for screening for malignant neoplasm of colon: Secondary | ICD-10-CM | POA: Diagnosis not present

## 2020-05-23 DIAGNOSIS — K5730 Diverticulosis of large intestine without perforation or abscess without bleeding: Secondary | ICD-10-CM | POA: Diagnosis not present

## 2020-05-23 DIAGNOSIS — Z800 Family history of malignant neoplasm of digestive organs: Secondary | ICD-10-CM | POA: Diagnosis not present

## 2020-05-23 DIAGNOSIS — K635 Polyp of colon: Secondary | ICD-10-CM | POA: Diagnosis not present

## 2020-05-23 DIAGNOSIS — D122 Benign neoplasm of ascending colon: Secondary | ICD-10-CM | POA: Diagnosis not present

## 2020-05-26 ENCOUNTER — Ambulatory Visit (INDEPENDENT_AMBULATORY_CARE_PROVIDER_SITE_OTHER): Payer: PPO

## 2020-05-26 ENCOUNTER — Telehealth: Payer: Self-pay

## 2020-05-26 DIAGNOSIS — I442 Atrioventricular block, complete: Secondary | ICD-10-CM

## 2020-05-26 NOTE — Telephone Encounter (Signed)
The pt called to see if we got her transmission. I let her know we did receive it but we got cut off. I tried to call her back but received the voicemail. I left a message letting her know we received the transmission and her next transmission is 08/26/2019.

## 2020-05-27 LAB — CUP PACEART REMOTE DEVICE CHECK
Battery Impedance: 504 Ohm
Battery Remaining Longevity: 87 mo
Battery Voltage: 2.78 V
Brady Statistic AP VP Percent: 0 %
Brady Statistic AP VS Percent: 98 %
Brady Statistic AS VP Percent: 0 %
Brady Statistic AS VS Percent: 2 %
Date Time Interrogation Session: 20220303114414
Implantable Lead Implant Date: 20150826
Implantable Lead Implant Date: 20150826
Implantable Lead Location: 753859
Implantable Lead Location: 753860
Implantable Lead Model: 5076
Implantable Lead Model: 5076
Implantable Pulse Generator Implant Date: 20150826
Lead Channel Impedance Value: 445 Ohm
Lead Channel Impedance Value: 606 Ohm
Lead Channel Pacing Threshold Amplitude: 0.5 V
Lead Channel Pacing Threshold Amplitude: 0.5 V
Lead Channel Pacing Threshold Pulse Width: 0.4 ms
Lead Channel Pacing Threshold Pulse Width: 0.4 ms
Lead Channel Setting Pacing Amplitude: 2 V
Lead Channel Setting Pacing Amplitude: 2.5 V
Lead Channel Setting Pacing Pulse Width: 0.4 ms
Lead Channel Setting Sensing Sensitivity: 4 mV

## 2020-06-06 NOTE — Progress Notes (Signed)
Remote pacemaker transmission.   

## 2020-07-13 DIAGNOSIS — H401131 Primary open-angle glaucoma, bilateral, mild stage: Secondary | ICD-10-CM | POA: Diagnosis not present

## 2020-07-13 DIAGNOSIS — H5201 Hypermetropia, right eye: Secondary | ICD-10-CM | POA: Diagnosis not present

## 2020-08-17 ENCOUNTER — Telehealth: Payer: Self-pay | Admitting: Internal Medicine

## 2020-08-17 NOTE — Telephone Encounter (Signed)
   [facility] Pre-operative Risk Assessment    Patient Name: Madison [name]  DOB: [birth year]  MRN: [id number]   HEARTCARE STAFF: - Please ensure there is not already an duplicate clearance open for this procedure. - Under Visit Info/Reason for Call, type in Other and utilize the format Clearance MM/DD/YY or Clearance TBD. Do not use dashes or single digits. - If request is for dental extraction, please clarify the # of teeth to be extracted.  Request for surgical clearance:  1. What type of surgery is being performed? Routine dental cleaning   2. When is this surgery scheduled? today  3. What type of clearance is required (medical clearance vs. Pharmacy clearance to hold med vs. Both)?   4. Are there any medications that need to be held prior to surgery and how long? Not sure   5. Practice name and name of physician performing surgery? Dr. Barnett Abu  6. What is the office phone number? [phone]   7.   What is the office fax number? [phone]  8.   Anesthesia type (None, local, MAC, general) ? none   Johnna Acosta 08/17/2020, 2:14 PM  _________________________________________________________________   (provider comments below)

## 2020-08-17 NOTE — Telephone Encounter (Signed)
Dr. Tamala Julian reviewed and said pt did not need antibiotics.  Made dentist office aware.

## 2020-08-25 ENCOUNTER — Ambulatory Visit (INDEPENDENT_AMBULATORY_CARE_PROVIDER_SITE_OTHER): Payer: PPO

## 2020-08-25 DIAGNOSIS — I442 Atrioventricular block, complete: Secondary | ICD-10-CM | POA: Diagnosis not present

## 2020-08-28 LAB — CUP PACEART REMOTE DEVICE CHECK
Battery Impedance: 554 Ohm
Battery Remaining Longevity: 83 mo
Battery Voltage: 2.78 V
Brady Statistic AP VP Percent: 0 %
Brady Statistic AP VS Percent: 98 %
Brady Statistic AS VP Percent: 0 %
Brady Statistic AS VS Percent: 2 %
Date Time Interrogation Session: 20220603142119
Implantable Lead Implant Date: 20150826
Implantable Lead Implant Date: 20150826
Implantable Lead Location: 753859
Implantable Lead Location: 753860
Implantable Lead Model: 5076
Implantable Lead Model: 5076
Implantable Pulse Generator Implant Date: 20150826
Lead Channel Impedance Value: 439 Ohm
Lead Channel Impedance Value: 590 Ohm
Lead Channel Pacing Threshold Amplitude: 0.5 V
Lead Channel Pacing Threshold Amplitude: 0.5 V
Lead Channel Pacing Threshold Pulse Width: 0.4 ms
Lead Channel Pacing Threshold Pulse Width: 0.4 ms
Lead Channel Setting Pacing Amplitude: 2 V
Lead Channel Setting Pacing Amplitude: 2.5 V
Lead Channel Setting Pacing Pulse Width: 0.4 ms
Lead Channel Setting Sensing Sensitivity: 4 mV

## 2020-09-02 DIAGNOSIS — S86002A Unspecified injury of left Achilles tendon, initial encounter: Secondary | ICD-10-CM | POA: Diagnosis not present

## 2020-09-02 DIAGNOSIS — M25562 Pain in left knee: Secondary | ICD-10-CM | POA: Diagnosis not present

## 2020-09-02 DIAGNOSIS — M25572 Pain in left ankle and joints of left foot: Secondary | ICD-10-CM | POA: Diagnosis not present

## 2020-09-02 DIAGNOSIS — M79672 Pain in left foot: Secondary | ICD-10-CM | POA: Diagnosis not present

## 2020-09-08 DIAGNOSIS — I10 Essential (primary) hypertension: Secondary | ICD-10-CM | POA: Diagnosis not present

## 2020-09-08 DIAGNOSIS — N390 Urinary tract infection, site not specified: Secondary | ICD-10-CM | POA: Diagnosis not present

## 2020-09-19 NOTE — Progress Notes (Signed)
Remote pacemaker transmission.   

## 2020-09-20 DIAGNOSIS — N3000 Acute cystitis without hematuria: Secondary | ICD-10-CM | POA: Diagnosis not present

## 2020-09-20 DIAGNOSIS — R531 Weakness: Secondary | ICD-10-CM | POA: Diagnosis not present

## 2020-09-20 DIAGNOSIS — R5081 Fever presenting with conditions classified elsewhere: Secondary | ICD-10-CM | POA: Diagnosis not present

## 2020-09-27 DIAGNOSIS — Z87440 Personal history of urinary (tract) infections: Secondary | ICD-10-CM | POA: Diagnosis not present

## 2020-09-27 DIAGNOSIS — F331 Major depressive disorder, recurrent, moderate: Secondary | ICD-10-CM | POA: Diagnosis not present

## 2020-09-27 DIAGNOSIS — F5101 Primary insomnia: Secondary | ICD-10-CM | POA: Diagnosis not present

## 2020-09-27 DIAGNOSIS — R531 Weakness: Secondary | ICD-10-CM | POA: Diagnosis not present

## 2020-11-07 DIAGNOSIS — J00 Acute nasopharyngitis [common cold]: Secondary | ICD-10-CM | POA: Diagnosis not present

## 2020-11-07 DIAGNOSIS — J0100 Acute maxillary sinusitis, unspecified: Secondary | ICD-10-CM | POA: Diagnosis not present

## 2020-11-07 DIAGNOSIS — R051 Acute cough: Secondary | ICD-10-CM | POA: Diagnosis not present

## 2020-11-10 DIAGNOSIS — E538 Deficiency of other specified B group vitamins: Secondary | ICD-10-CM | POA: Diagnosis not present

## 2020-11-16 DIAGNOSIS — R7301 Impaired fasting glucose: Secondary | ICD-10-CM | POA: Diagnosis not present

## 2020-11-16 DIAGNOSIS — E785 Hyperlipidemia, unspecified: Secondary | ICD-10-CM | POA: Diagnosis not present

## 2020-11-16 DIAGNOSIS — M8589 Other specified disorders of bone density and structure, multiple sites: Secondary | ICD-10-CM | POA: Diagnosis not present

## 2020-11-16 DIAGNOSIS — Z1231 Encounter for screening mammogram for malignant neoplasm of breast: Secondary | ICD-10-CM | POA: Diagnosis not present

## 2020-11-16 DIAGNOSIS — E041 Nontoxic single thyroid nodule: Secondary | ICD-10-CM | POA: Diagnosis not present

## 2020-11-23 DIAGNOSIS — R251 Tremor, unspecified: Secondary | ICD-10-CM | POA: Diagnosis not present

## 2020-11-23 DIAGNOSIS — I872 Venous insufficiency (chronic) (peripheral): Secondary | ICD-10-CM | POA: Diagnosis not present

## 2020-11-23 DIAGNOSIS — Z1331 Encounter for screening for depression: Secondary | ICD-10-CM | POA: Diagnosis not present

## 2020-11-23 DIAGNOSIS — I7090 Unspecified atherosclerosis: Secondary | ICD-10-CM | POA: Diagnosis not present

## 2020-11-23 DIAGNOSIS — R7301 Impaired fasting glucose: Secondary | ICD-10-CM | POA: Diagnosis not present

## 2020-11-23 DIAGNOSIS — R82998 Other abnormal findings in urine: Secondary | ICD-10-CM | POA: Diagnosis not present

## 2020-11-23 DIAGNOSIS — M8580 Other specified disorders of bone density and structure, unspecified site: Secondary | ICD-10-CM | POA: Diagnosis not present

## 2020-11-23 DIAGNOSIS — M1611 Unilateral primary osteoarthritis, right hip: Secondary | ICD-10-CM | POA: Diagnosis not present

## 2020-11-23 DIAGNOSIS — E041 Nontoxic single thyroid nodule: Secondary | ICD-10-CM | POA: Diagnosis not present

## 2020-11-23 DIAGNOSIS — I10 Essential (primary) hypertension: Secondary | ICD-10-CM | POA: Diagnosis not present

## 2020-11-23 DIAGNOSIS — I2510 Atherosclerotic heart disease of native coronary artery without angina pectoris: Secondary | ICD-10-CM | POA: Diagnosis not present

## 2020-11-23 DIAGNOSIS — M791 Myalgia, unspecified site: Secondary | ICD-10-CM | POA: Diagnosis not present

## 2020-11-23 DIAGNOSIS — E785 Hyperlipidemia, unspecified: Secondary | ICD-10-CM | POA: Diagnosis not present

## 2020-11-23 DIAGNOSIS — Z1389 Encounter for screening for other disorder: Secondary | ICD-10-CM | POA: Diagnosis not present

## 2020-11-23 DIAGNOSIS — Z23 Encounter for immunization: Secondary | ICD-10-CM | POA: Diagnosis not present

## 2020-11-23 DIAGNOSIS — Z0000 Encounter for general adult medical examination without abnormal findings: Secondary | ICD-10-CM | POA: Diagnosis not present

## 2020-11-24 ENCOUNTER — Other Ambulatory Visit: Payer: Self-pay | Admitting: Internal Medicine

## 2020-11-24 ENCOUNTER — Ambulatory Visit (INDEPENDENT_AMBULATORY_CARE_PROVIDER_SITE_OTHER): Payer: PPO

## 2020-11-24 DIAGNOSIS — E041 Nontoxic single thyroid nodule: Secondary | ICD-10-CM

## 2020-11-24 DIAGNOSIS — I442 Atrioventricular block, complete: Secondary | ICD-10-CM | POA: Diagnosis not present

## 2020-11-25 ENCOUNTER — Telehealth: Payer: Self-pay

## 2020-11-25 NOTE — Telephone Encounter (Signed)
The patient left a message for the nurse to call her and go over the transmission she just sent. The Transmission is in [place].

## 2020-11-29 LAB — CUP PACEART REMOTE DEVICE CHECK
Battery Impedance: 629 Ohm
Battery Remaining Longevity: 78 mo
Battery Voltage: 2.78 V
Brady Statistic AP VP Percent: 0 %
Brady Statistic AP VS Percent: 98 %
Brady Statistic AS VP Percent: 0 %
Brady Statistic AS VS Percent: 2 %
Date Time Interrogation Session: 20220902134820
Implantable Lead Implant Date: 20150826
Implantable Lead Implant Date: 20150826
Implantable Lead Location: 753859
Implantable Lead Location: 753860
Implantable Lead Model: 5076
Implantable Lead Model: 5076
Implantable Pulse Generator Implant Date: 20150826
Lead Channel Impedance Value: 428 Ohm
Lead Channel Impedance Value: 642 Ohm
Lead Channel Pacing Threshold Amplitude: 0.5 V
Lead Channel Pacing Threshold Amplitude: 0.5 V
Lead Channel Pacing Threshold Pulse Width: 0.4 ms
Lead Channel Pacing Threshold Pulse Width: 0.4 ms
Lead Channel Setting Pacing Amplitude: 2 V
Lead Channel Setting Pacing Amplitude: 2.5 V
Lead Channel Setting Pacing Pulse Width: 0.4 ms
Lead Channel Setting Sensing Sensitivity: 4 mV

## 2020-11-29 NOTE — Telephone Encounter (Signed)
Called patient to advise her remote transmission was normal. Patient appreciative of call.

## 2020-12-02 ENCOUNTER — Ambulatory Visit
Admission: RE | Admit: 2020-12-02 | Discharge: 2020-12-02 | Disposition: A | Discharge status: Home / Self Care | Payer: PPO | Source: Ambulatory Visit | Attending: Internal Medicine | Admitting: Internal Medicine

## 2020-12-02 DIAGNOSIS — E041 Nontoxic single thyroid nodule: Secondary | ICD-10-CM | POA: Diagnosis not present

## 2020-12-06 NOTE — Progress Notes (Signed)
Remote pacemaker transmission.   

## 2020-12-07 ENCOUNTER — Other Ambulatory Visit: Payer: Self-pay | Admitting: Internal Medicine

## 2020-12-07 DIAGNOSIS — E041 Nontoxic single thyroid nodule: Secondary | ICD-10-CM

## 2021-01-10 ENCOUNTER — Other Ambulatory Visit (HOSPITAL_COMMUNITY)
Admission: RE | Admit: 2021-01-10 | Discharge: 2021-01-10 | Disposition: A | Discharge status: Home / Self Care | Payer: PPO | Source: Ambulatory Visit | Attending: Radiology | Admitting: Radiology

## 2021-01-10 ENCOUNTER — Ambulatory Visit
Admission: RE | Admit: 2021-01-10 | Discharge: 2021-01-10 | Disposition: A | Discharge status: Home / Self Care | Payer: PPO | Source: Ambulatory Visit | Attending: Internal Medicine | Admitting: Internal Medicine

## 2021-01-10 DIAGNOSIS — E041 Nontoxic single thyroid nodule: Secondary | ICD-10-CM | POA: Diagnosis not present

## 2021-01-10 IMAGING — US US FNA BIOPSY THYROID 1ST LESION
1 series · 13 of 13 positions shown · non-contrast
Comparison: Thyroid ultrasound performed [DATE]

MEDICATIONS:
1% plain lidocaine, 1 mL

COMPLICATIONS:
None immediate.

INDICATION: Indeterminate right thyroid nodule.

EXAM:
ULTRASOUND GUIDED FINE NEEDLE ASPIRATION OF INDETERMINATE RIGHT
THYROID NODULE
TECHNIQUE: Informed written consent was obtained from the patient after a
discussion of the risks, benefits and alternatives to treatment.
Questions regarding the procedure were encouraged and answered. A
timeout was performed prior to the initiation of the procedure.

[Series 1: us fna biopsy thyroid 1st lesion · 0.05mm/px · 13 acquisitions, 13 frames shown]
[im 1/13]
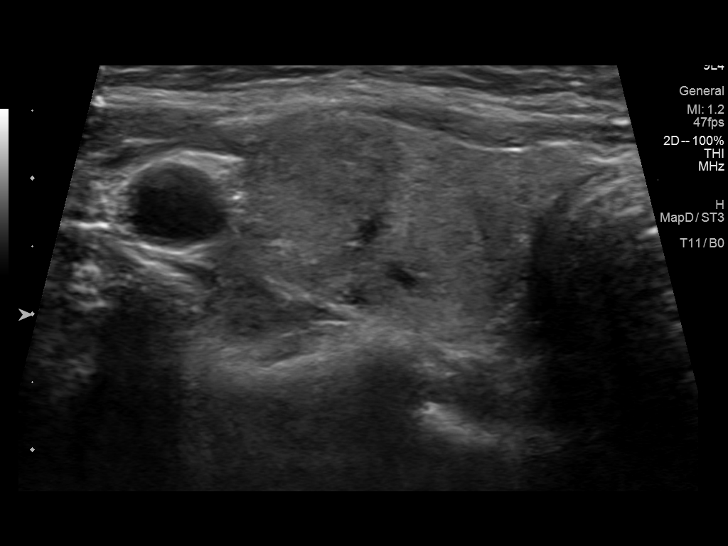
[im 2/13]
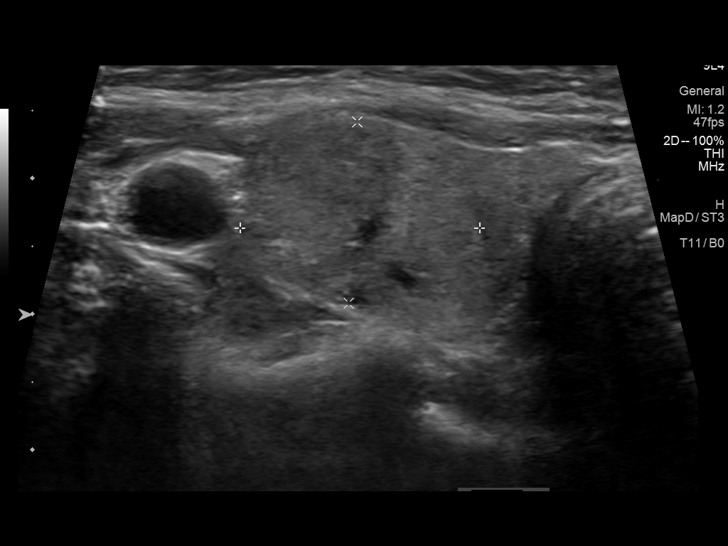
[im 3/13]
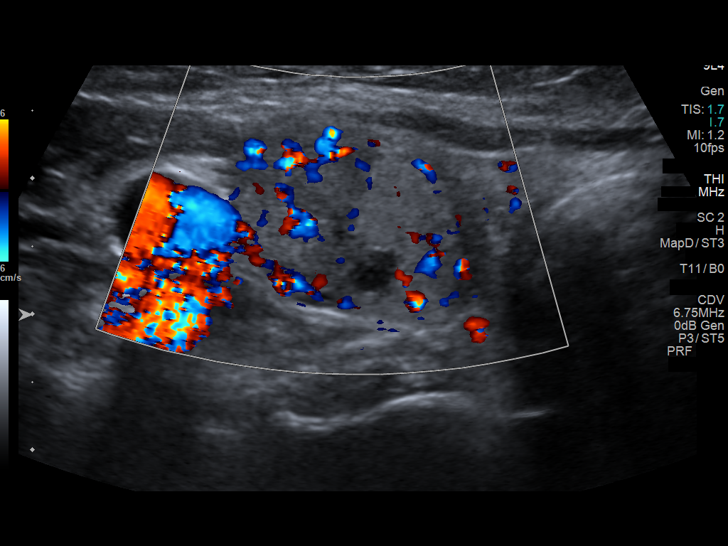
[im 4/13]
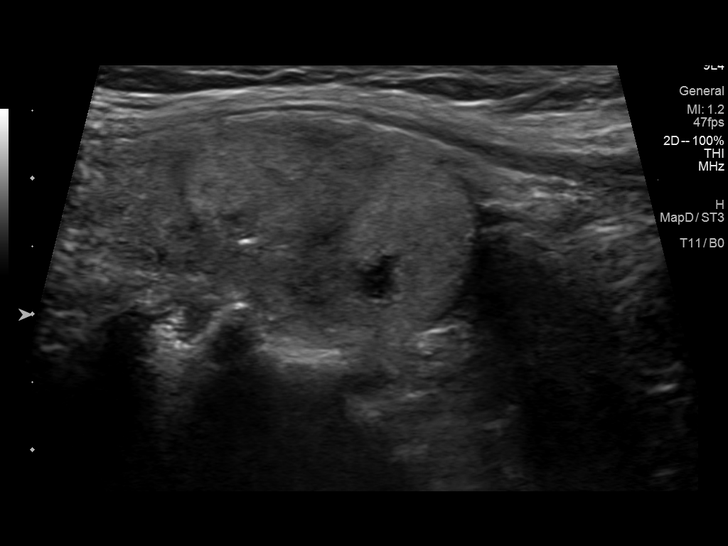
[im 5/13]
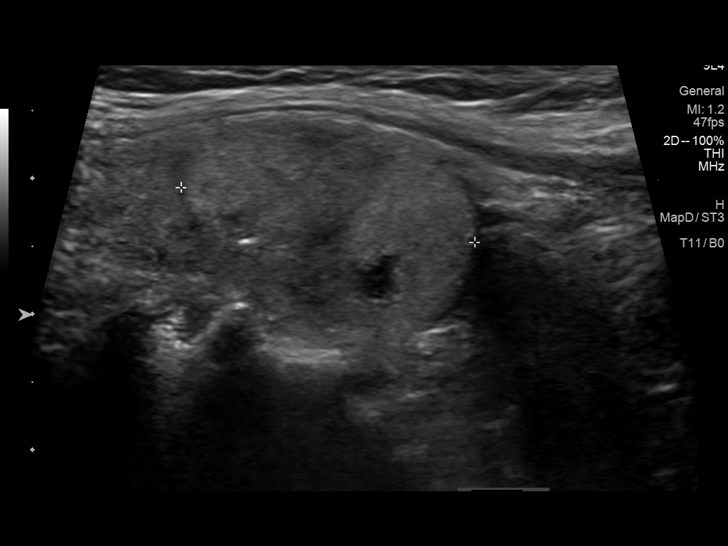
[im 6/13]
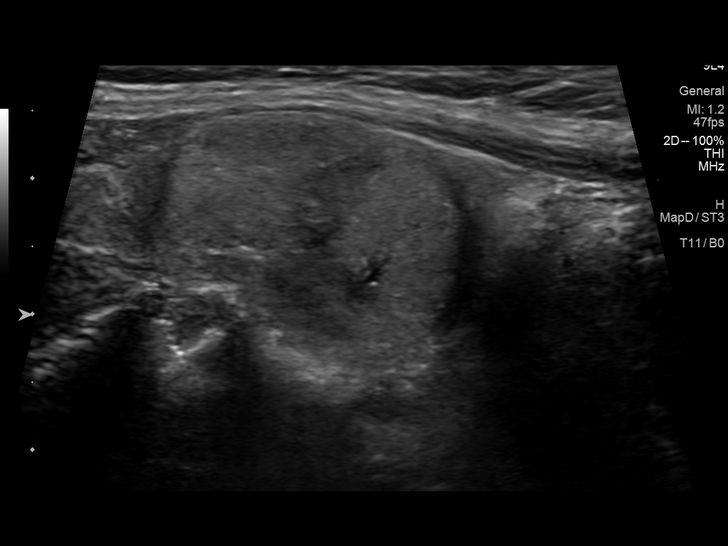
[im 7/13]
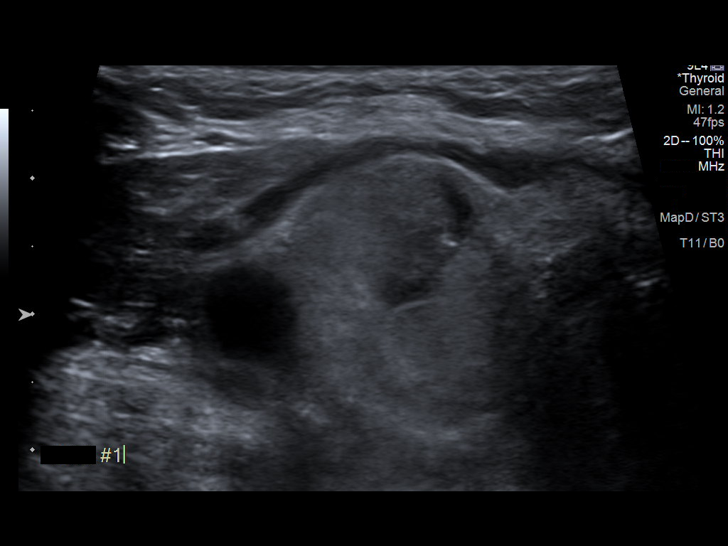
[im 8/13]
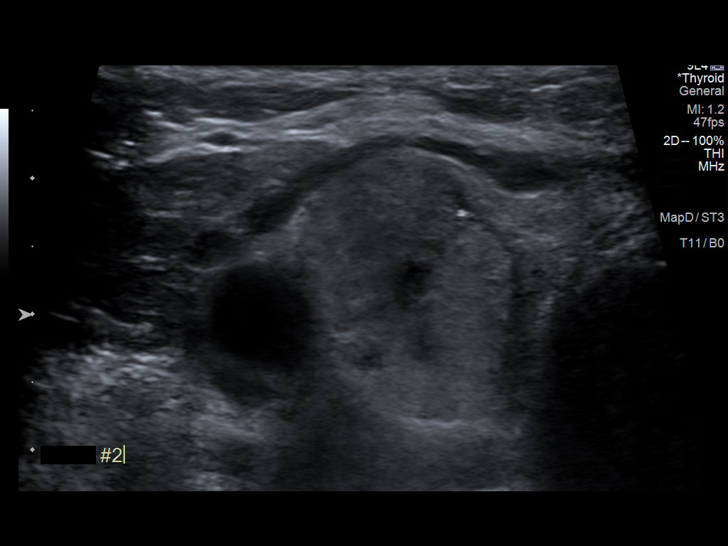
[im 9/13]
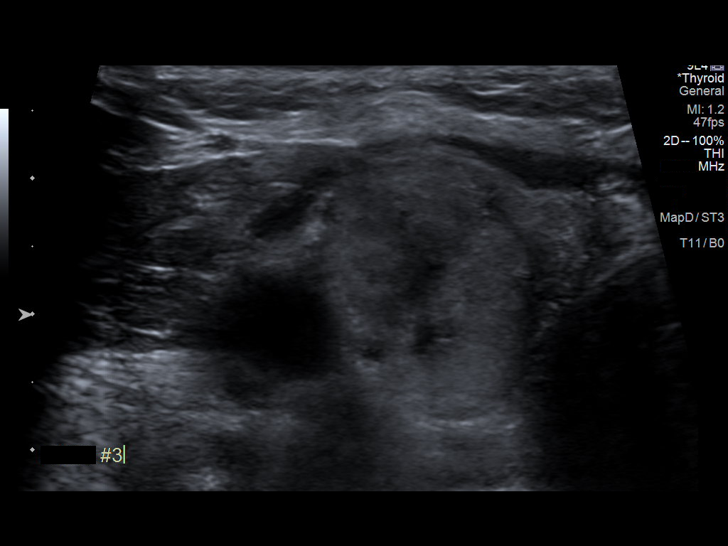
[im 10/13]
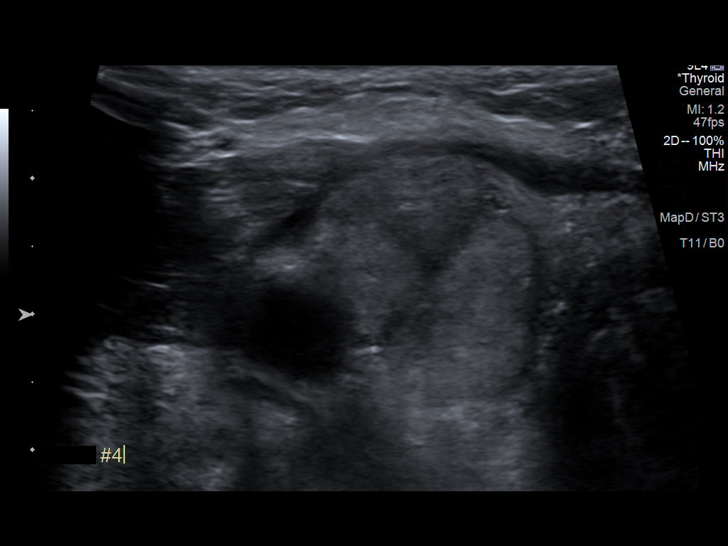
[im 11/13]
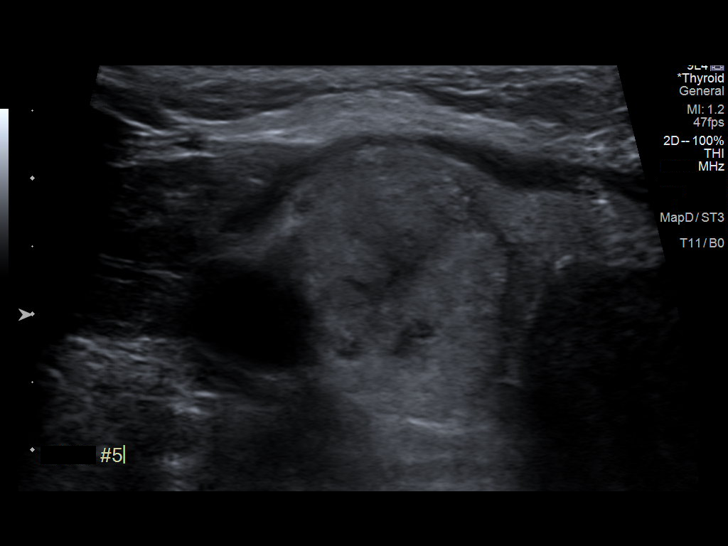
[im 12/13]
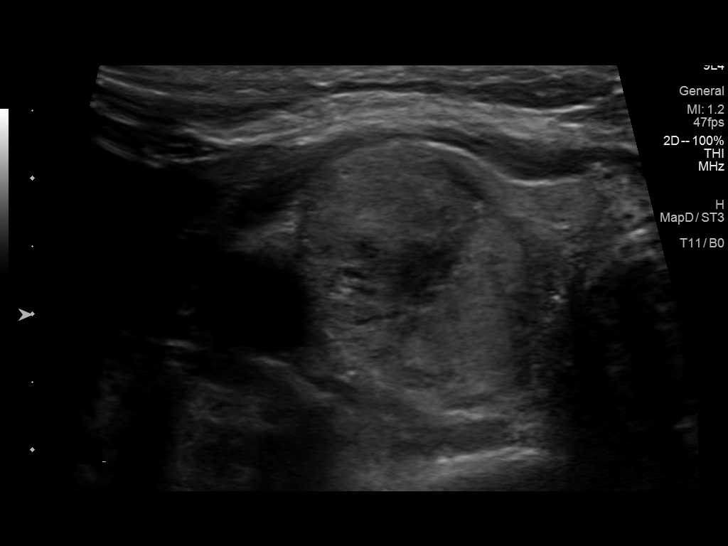
[im 13/13]
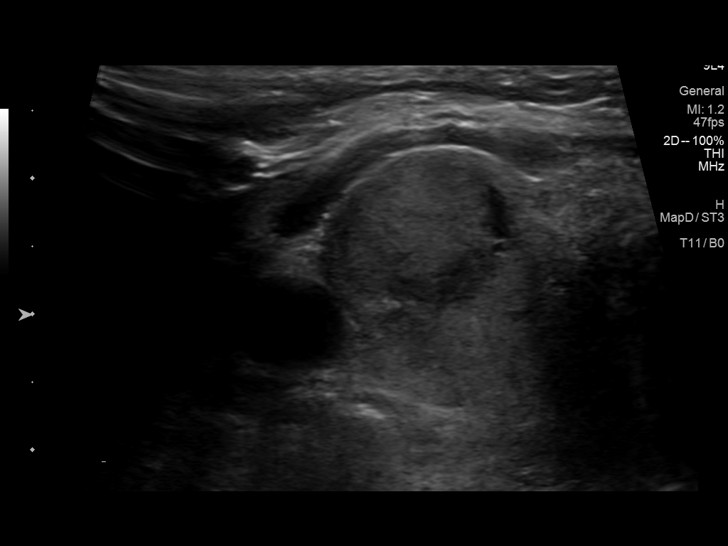

[13 of 13 positions shown; findings below may reference images not displayed]

Pre-procedural ultrasound scanning demonstrated unchanged size and
appearance of the indeterminate nodule within the right thyroid
lobe.

The procedure was planned. The neck was prepped in the usual sterile
fashion, and a sterile drape was applied covering the operative
field. A timeout was performed prior to the initiation of the
procedure. Local anesthesia was provided with 1% lidocaine.

Under direct ultrasound guidance, 5 FNA biopsies were performed of
the right mid thyroid nodule with a 25 gauge needle. Multiple
ultrasound images were saved for procedural documentation purposes.
The samples were prepared and submitted to pathology.

Limited post procedural scanning was negative for hematoma or
additional complication. Dressings were placed. The patient
tolerated the above procedures procedure well without immediate
postprocedural complication.
FINDINGS: FINDINGS
Nodule reference number based on prior diagnostic ultrasound: 1

Maximum size: 2.4 cm

Location: Right  ;  Mid

ACR TI-RADS total points: 4

ACR TI-RADS risk category:  TR4 (4-6 points)

Prior biopsy:  No

Reason for biopsy: meets ACR TI-RADS criteria

Ultrasound imaging confirms appropriate placement of the needles
within the thyroid nodule.
IMPRESSION: Technically successful ultrasound guided fine needle aspiration of
right mid thyroid nodule as described above.

## 2021-01-11 LAB — CYTOLOGY - NON PAP

## 2021-01-23 DIAGNOSIS — M25531 Pain in right wrist: Secondary | ICD-10-CM | POA: Diagnosis not present

## 2021-01-23 DIAGNOSIS — W19XXXA Unspecified fall, initial encounter: Secondary | ICD-10-CM | POA: Diagnosis not present

## 2021-01-23 DIAGNOSIS — F330 Major depressive disorder, recurrent, mild: Secondary | ICD-10-CM | POA: Diagnosis not present

## 2021-01-23 DIAGNOSIS — Z9181 History of falling: Secondary | ICD-10-CM | POA: Diagnosis not present

## 2021-01-24 ENCOUNTER — Other Ambulatory Visit: Payer: Self-pay

## 2021-01-24 DIAGNOSIS — R2243 Localized swelling, mass and lump, lower limb, bilateral: Secondary | ICD-10-CM | POA: Diagnosis not present

## 2021-01-24 DIAGNOSIS — R600 Localized edema: Secondary | ICD-10-CM | POA: Diagnosis not present

## 2021-01-24 DIAGNOSIS — Z7689 Persons encountering health services in other specified circumstances: Secondary | ICD-10-CM | POA: Diagnosis not present

## 2021-01-24 DIAGNOSIS — R0602 Shortness of breath: Secondary | ICD-10-CM | POA: Diagnosis not present

## 2021-01-24 MED ORDER — EZETIMIBE 10 MG PO TABS: 10.0000 mg | ORAL_TABLET | Freq: Every day | ORAL | 0 refills | Status: DC

## 2021-01-25 DIAGNOSIS — H52201 Unspecified astigmatism, right eye: Secondary | ICD-10-CM | POA: Diagnosis not present

## 2021-01-25 DIAGNOSIS — H2512 Age-related nuclear cataract, left eye: Secondary | ICD-10-CM | POA: Diagnosis not present

## 2021-01-25 DIAGNOSIS — H401131 Primary open-angle glaucoma, bilateral, mild stage: Secondary | ICD-10-CM | POA: Diagnosis not present

## 2021-01-26 ENCOUNTER — Other Ambulatory Visit: Payer: Self-pay | Admitting: Internal Medicine

## 2021-01-26 ENCOUNTER — Telehealth: Payer: Self-pay | Admitting: Internal Medicine

## 2021-01-26 MED ORDER — EZETIMIBE 10 MG PO TABS: 10.0000 mg | ORAL_TABLET | Freq: Every day | ORAL | 0 refills | Status: DC

## 2021-01-26 NOTE — Telephone Encounter (Signed)
Refill sent to pharmacy.   

## 2021-01-26 NOTE — Telephone Encounter (Signed)
 *  STAT* If patient is at the pharmacy, call can be transferred to refill team.   1. Which medications need to be refilled? (please list name of each medication and dose if known) ezetimibe (ZETIA) 10 MG tablet  2. Which pharmacy/location (including street and city if local pharmacy) is medication to be sent to? [place], [place]  3. Do they need a 30 day or 90 day supply? 30 days  Pt preferred to send her refill to [place], [place], please cancel prescription [facility] to [facility] and resend it to [facility]

## 2021-01-30 ENCOUNTER — Other Ambulatory Visit: Payer: Self-pay | Admitting: Internal Medicine

## 2021-02-08 ENCOUNTER — Encounter (HOSPITAL_COMMUNITY): Payer: Self-pay

## 2021-02-09 DIAGNOSIS — R3121 Asymptomatic microscopic hematuria: Secondary | ICD-10-CM | POA: Diagnosis not present

## 2021-02-09 DIAGNOSIS — R350 Frequency of micturition: Secondary | ICD-10-CM | POA: Diagnosis not present

## 2021-02-23 ENCOUNTER — Ambulatory Visit (INDEPENDENT_AMBULATORY_CARE_PROVIDER_SITE_OTHER): Payer: PPO

## 2021-02-23 DIAGNOSIS — I442 Atrioventricular block, complete: Secondary | ICD-10-CM

## 2021-02-24 ENCOUNTER — Telehealth: Payer: Self-pay

## 2021-02-24 LAB — CUP PACEART REMOTE DEVICE CHECK
Battery Impedance: 680 Ohm
Battery Remaining Longevity: 76 mo
Battery Voltage: 2.78 V
Brady Statistic AP VP Percent: 0 %
Brady Statistic AP VS Percent: 98 %
Brady Statistic AS VP Percent: 0 %
Brady Statistic AS VS Percent: 2 %
Date Time Interrogation Session: 20221202124835
Implantable Lead Implant Date: 20150826
Implantable Lead Implant Date: 20150826
Implantable Lead Location: 753859
Implantable Lead Location: 753860
Implantable Lead Model: 5076
Implantable Lead Model: 5076
Implantable Pulse Generator Implant Date: 20150826
Lead Channel Impedance Value: 464 Ohm
Lead Channel Impedance Value: 584 Ohm
Lead Channel Pacing Threshold Amplitude: 0.625 V
Lead Channel Pacing Threshold Amplitude: 0.625 V
Lead Channel Pacing Threshold Pulse Width: 0.4 ms
Lead Channel Pacing Threshold Pulse Width: 0.4 ms
Lead Channel Setting Pacing Amplitude: 2 V
Lead Channel Setting Pacing Amplitude: 2.5 V
Lead Channel Setting Pacing Pulse Width: 0.4 ms
Lead Channel Setting Sensing Sensitivity: 4 mV

## 2021-02-24 NOTE — Telephone Encounter (Signed)
The patient states she needs an appointment to see Dr. Harrington Challenger. She only wants to see Dr. Harrington Challenger and not a Np or Pa. I told her I will have someone call her back to schedule the appointment.

## 2021-02-27 DIAGNOSIS — R350 Frequency of micturition: Secondary | ICD-10-CM | POA: Diagnosis not present

## 2021-02-27 DIAGNOSIS — R3121 Asymptomatic microscopic hematuria: Secondary | ICD-10-CM | POA: Diagnosis not present

## 2021-03-02 NOTE — Telephone Encounter (Addendum)
Scheduled the pt for a one year OV for 03/29/2021.   Left a message for the pt to call back.

## 2021-03-07 NOTE — Progress Notes (Signed)
Remote pacemaker transmission.   

## 2021-03-28 NOTE — Progress Notes (Signed)
Cardiology Office Note   Date:  03/29/2021   ID:  Madison, [name] April 18, 1933, MRN [id number]  PCP:  Crist Infante, MD  Cardiologist:   Dorris Carnes, MD   F/U of HTN and CAD    History of Present Illness: Madison [name] is a 88 y.o. female with a history of mild to mod CAD  Myovue in 2015 normal Echo showed normal LVEF Cardopulmonary stress test consistent with deconditioning.   The pt also has hx of HTN, PVCs, GERD, HL  And she is  s/p PPM in 2015 for SN dysfunction    After I saw her last she had an echo done   LVEF / RVEF normal    She also had a carotid USN which showed on significant dz     I saw the pt in clinic in Jan 2022  Since then she denies CP   Breathing is OK   No palpitatoins.   No dizziness   Still grieving for loss of husban Did have some hematuria    Seen eventually in [place] (Dr Claudia Desanctis)   Not clear on thoughts   Does not want to have a cystoscopy   She says it actually has improved       Allergies:   Tape, Codeine, Latex, Other, Oxycodone-acetaminophen, Penicillins, Percodan [oxycodone-aspirin], Pravastatin, Repatha [evolocumab], Vancomycin, and Epinephrine   Past Medical History:  Diagnosis Date   Acquired absence of breast and nipple    Acute myocardial infarction, unspecified site, episode of care unspecified    1965 and 2015    ALLERGIC RHINITIS    Anginal pain (HCC)    Arthritis    Asthma    Blindness of left eye    decreased vision in left eye related to ocular occlusion    Breast cancer ([place])    left mastectomy   Complication of anesthesia    Coronary atherosclerosis    Cough    Dysfunction of eustachian tube    Esophageal reflux    Heart murmur    Hemorrhoids    Hyperlipidemia    Hypertension    Peripheral vascular disease ([place])    Pneumonia    hx of walking pneumonia x 2    PONV (postoperative nausea and vomiting)    Presence of permanent cardiac pacemaker    PVC's (premature ventricular contractions)    Shortness of breath    Stress  incontinence    Syncope 11/17/2013    Past Surgical History:  Procedure Laterality Date   ABDOMINAL HYSTERECTOMY  1967   CARDIAC CATHETERIZATION  03/01/1986   normal coronaries (Dr. Domenic Moras)   CARDIAC CATHETERIZATION  10/25/2002   normal L main; LAD w/40% narrowing in prox 3rd and 60-70% narrowing beyond 1st diagonal, LAD was tortuous; dominant RCA with 30-40% segmental narrowing and 20-30% narrowing at junction of prox 3rd (Dr. Marella Chimes)   CARDIAC CATHETERIZATION  04/11/2006   trivial luminal irregularities in coronaries and mid LAD 50% (Dr. Domenic Moras)   CARDIOPULMONARY MET TEST  05/05/2012   excellent effort w/RER 1.06, peak VO2>100%, peak HR 81%, good functional capacity   CAROTID DOPPLER  2005   normal study (ordered for swelling & pain, left neck clavicle to ear)   DILATION AND CURETTAGE OF UTERUS  1962-1968   x4   GALLBLADDER SURGERY  1985   MASTECTOMY Left 1981   MYOMECTOMY  1968   NM MYOCAR PERF WALL MOTION  2012   bruce myoview -no inducible ischemia, EF 71%, low  risk scan   PERMANENT PACEMAKER INSERTION N/A 11/18/2013   Procedure: PERMANENT PACEMAKER INSERTION;  Surgeon: Evans Lance, MD;  Location: [facility] CATH LAB;  Service: Cardiovascular;  Laterality: N/A;   PLACEMENT OF BREAST IMPLANTS  1993   [facility]   REMOVAL OF BILATERAL TISSUE EXPANDERS WITH PLACEMENT OF BILATERAL BREAST IMPLANTS     TOTAL HIP ARTHROPLASTY Right 08/02/2015   Procedure: RIGHT TOTAL HIP ARTHROPLASTY ANTERIOR APPROACH;  Surgeon: Paralee Cancel, MD;  Location: [facility] ORS;  Service: Orthopedics;  Laterality: Right;   TRANSTHORACIC ECHOCARDIOGRAM  2014   EF 35-57%, grade 1 diastolic dysfunction; mildly thickened MV leaflets, trivial regurg      Social History:  The patient  reports that she has never smoked. She has never used smokeless tobacco. She reports current alcohol use. She reports that she does not use drugs.   Family History:  The patient's family history includes CAD in her brother and brother; Cancer  in her brother, maternal grandfather, mother, and sister; Cervical cancer in her sister; Colon cancer in her sister; Colon cancer (age of onset: 54) in her mother; Heart attack in her brother; Heart disease in her maternal grandfather, maternal grandmother, mother, and paternal grandmother; Liver cancer in her brother; Lymphoma in her sister; Stroke in her brother, brother, maternal grandmother, mother, and paternal grandfather.    ROS:  Please see the history of present illness. All other systems are reviewed and  Negative to the above problem except as noted.    PHYSICAL EXAM: VS:  BP 122/74    Pulse 77    Ht _0  (1.6 m)    Wt 167 lb (75.8 kg)    BMI 29.58 kg/m     GEN: Well nourished, well developed, in no acute distress  HEENT: normal  Neck: no JVD, carotid bruits Cardiac: RRR; no murmurs  No  LE edema  Respiratory:  clear to auscultation bilaterally,  GI: soft, nontender, nondistended, + BS  No hepatomegaly  MS: no deformity Moving all extremities   Skin: warm and dry, no rash Neuro:  Strength and sensation are intact Psych: euthymic mood, full affect   EKG:  EKG is not  ordered today.    Echo   2/22  Left ventricular ejection fraction, by estimation, is 60 to 65%. Left ventricular ejection fraction by 3D volume is 66 %. The left ventricle has normal function. The left ventricle has no regional wall motion abnormalities. There is mild concentric left ventricular hypertrophy. The average left ventricular global longitudinal strain is -20.2 %. The global longitudinal strain is normal, though regional strain pattern is notable for diminished inferoseptal base deformation. In some views (A4C) there appears to be a small echo dense trabeculation in the apex. Normal wall motion. Would consider Echo Contrast Study if clinically indicated. 2. Right ventricular systolic function is normal. The right ventricular size is normal. There is normal pulmonary artery systolic pressure. The  estimated right ventricular systolic pressure is 32.2 mmHg. 3. The mitral valve is grossly normal. No evidence of mitral valve regurgitation. 4. The aortic valve is tricuspid. Aortic valve regurgitation is not visualized. 5. Aortic dilatation noted. There is mild dilatation of the ascending aorta, measuring 39 mm. Comparison(s): A prior study was performed on 10/28/2014. Compared to report and still images from prior study, similar study.  Carotid USN  04/2020  Right Carotid: The extracranial vessels were near-normal with only minimal wall thickening or plaque. Left Carotid: There was no evidence of thrombus, dissection, atherosclerotic plaque or  stenosis in the cervical carotid system. Final AFIFA TRUAX [id number] 05/06/2020 Incidental finding of bilateral thyroid nodules, both are hyperechoic in echo texture. Right dominant nodule 2.4 x 1.5 x 1.4 cm with small calcification within. Left inferior pole nodule .9 x .8 x .7 cm. Dedicated thyroid ultrasound is recommended if clinically warranted. *See table(s) above for measurements and observations. Electronically signed by Kathlyn Sacramento MD on 05/07/2020 at 2:59:39 PM. Vertebrals: Bilateral vertebral arteries demonstrate antegrade flow. Subclavians: Normal flow hemodynamics were seen in bilateral subclavian arteries. Lipid Panel    Component Value Date/Time   CHOL 176 10/28/2014 0440   TRIG 126 10/28/2014 0440   HDL 42 10/28/2014 0440   CHOLHDL 4.2 10/28/2014 0440   VLDL 25 10/28/2014 0440   LDLCALC 109 (H) 10/28/2014 0440      Wt Readings from Last 3 Encounters:  03/29/21 167 lb (75.8 kg)  04/25/20 164 lb (74.4 kg)  09/09/19 165 lb 6.4 oz (75 kg)      ASSESSMENT AND PLAN:  1  HTN  BP is controlled  Would continue to follow  2 Hx of CAD Mild   Remains asympotmatic     3  Hx PPM  WIll make sure she has f/u  in EP   4  Hx dizziness   Rare, with standing   5  Lipids  COntinue to follow   LDL 71  HDL 57    6  CV dz    minimal  7  Thryorid   Nodules noted  Bx negative   8  Hematuria   Will repeat UA    Get records for Dr Claudia Desanctis (CT, USN,)    Pt wants to understand    Overall she says it seems to be getting better    From a cardiac standpoint I think pt is at low risk for cardiac complication from colonscopy and OK to proceed    Current medicines are reviewed at length with the patient today.  The patient does not have concerns regarding medicines.  Signed, Dorris Carnes, MD  03/29/2021 9:53 PM    [address], [place], [place]  [id number] Phone: [phone]; Fax: [phone]

## 2021-03-29 ENCOUNTER — Encounter: Payer: Self-pay | Admitting: Internal Medicine

## 2021-03-29 ENCOUNTER — Ambulatory Visit: Payer: PPO | Admitting: Internal Medicine

## 2021-03-29 ENCOUNTER — Other Ambulatory Visit: Payer: Self-pay

## 2021-03-29 VITALS — BP 122/74 | HR 77 | Ht 63.0 in | Wt 167.0 lb

## 2021-03-29 DIAGNOSIS — R319 Hematuria, unspecified: Secondary | ICD-10-CM

## 2021-03-29 NOTE — Patient Instructions (Signed)
Medication Instructions:  Your physician recommends that you continue on your current medications as directed. Please refer to the Current Medication list given to you today.  *If you need a refill on your cardiac medications before your next appointment, please call your pharmacy*   Lab Work: UA with Micro If you have labs (blood work) drawn today and your tests are completely normal, you will receive your results only by: [place] (if you have MyChart) OR A paper copy in the mail If you have any lab test that is abnormal or we need to change your treatment, we will call you to review the results.   Testing/Procedures: none   Follow-Up: At [facility], you and your health needs are our priority.  As part of our continuing mission to provide you with exceptional heart care, we have created designated Provider Care Teams.  These Care Teams include your primary Cardiologist (physician) and Advanced Practice Providers (APPs -  Physician Assistants and Nurse Practitioners) who all work together to provide you with the care you need, when you need it.  We recommend signing up for the patient portal called "MyChart".  Sign up information is provided on this After Visit Summary.  MyChart is used to connect with patients for Virtual Visits (Telemedicine).  Patients are able to view lab/test results, encounter notes, upcoming appointments, etc.  Non-urgent messages can be sent to your provider as well.   To learn more about what you can do with MyChart, go to [email or web address].    Your next appointment:   1 year(s)  The format for your next appointment:   In Person  Provider:   Dorris Carnes, MD     Other Instructions

## 2021-03-30 ENCOUNTER — Telehealth: Payer: Self-pay | Admitting: Internal Medicine

## 2021-03-30 LAB — URINALYSIS, ROUTINE W REFLEX MICROSCOPIC
Bilirubin, UA: NEGATIVE
Glucose, UA: NEGATIVE
Ketones, UA: NEGATIVE
Nitrite, UA: NEGATIVE
Protein,UA: NEGATIVE
RBC, UA: NEGATIVE
Specific Gravity, UA: 1.019 (ref 1.005–1.030)
Urobilinogen, Ur: 0.2 mg/dL (ref 0.2–1.0)
pH, UA: 6.5 (ref 5.0–7.5)

## 2021-03-30 LAB — MICROSCOPIC EXAMINATION
Bacteria, UA: NONE SEEN
Casts: NONE SEEN /lpf
RBC, Urine: NONE SEEN /hpf (ref 0–2)

## 2021-03-30 NOTE — Telephone Encounter (Signed)
Pt is returning call to triage °

## 2021-03-30 NOTE — Telephone Encounter (Signed)
Records requested from [facility] at Dr. Keane Scrape office but advised that she needs a signed medical release... however pt not int he office and has given me verbal approval to obtain Dr. Keane Scrape notes and CT report.   Letter request faxed to their office.

## 2021-03-30 NOTE — Telephone Encounter (Signed)
-----   Message from Fay Records, MD sent at 03/29/2021  9:59 PM EST ----- Need urology records from Dr PAce   [facility] urology)  (notes, CT)

## 2021-03-31 ENCOUNTER — Ambulatory Visit: Payer: PPO | Admitting: Internal Medicine

## 2021-03-31 ENCOUNTER — Telehealth: Payer: Self-pay | Admitting: Internal Medicine

## 2021-03-31 ENCOUNTER — Other Ambulatory Visit: Payer: Self-pay

## 2021-03-31 ENCOUNTER — Encounter: Payer: Self-pay | Admitting: Internal Medicine

## 2021-03-31 VITALS — BP 118/74 | HR 82 | Ht 63.0 in | Wt 167.0 lb

## 2021-03-31 DIAGNOSIS — I495 Sick sinus syndrome: Secondary | ICD-10-CM

## 2021-03-31 DIAGNOSIS — I10 Essential (primary) hypertension: Secondary | ICD-10-CM | POA: Diagnosis not present

## 2021-03-31 DIAGNOSIS — Z950 Presence of cardiac pacemaker: Secondary | ICD-10-CM | POA: Diagnosis not present

## 2021-03-31 NOTE — Patient Instructions (Signed)
Medication Instructions:  Your physician recommends that you continue on your current medications as directed. Please refer to the Current Medication list given to you today.  Labwork: None ordered.  Testing/Procedures: None ordered.  Follow-Up: Your physician wants you to follow-up in: 1.5 years with Cristopher Peru, MD   Remote monitoring is used to monitor your Pacemaker from home. This monitoring reduces the number of office visits required to check your device to one time per year. It allows [place] to keep an eye on the functioning of your device to ensure it is working properly. You are scheduled for a device check from home on 05/25/2021. You may send your transmission at any time that day. If you have a wireless device, the transmission will be sent automatically. After your physician reviews your transmission, you will receive a postcard with your next transmission date.  Any Other Special Instructions Will Be Listed Below (If Applicable).  If you need a refill on your cardiac medications before your next appointment, please call your pharmacy.

## 2021-03-31 NOTE — Telephone Encounter (Signed)
Called Madison [name]    Reviewed labs (UA)  CT from Dr Claudia Desanctis showed no masses Note from Dr Claudia Desanctis   Comment made that one UA was without red cells This UA was without red cells   Madison [name] will follow up with Dr Parthenia Ames   Did not want to rush into cystoscopy Felt no one explained to her why   Rushed into testing

## 2021-03-31 NOTE — Telephone Encounter (Signed)
Records received and given to Dr. Harrington Challenger to review. Pt advised.

## 2021-04-01 NOTE — Progress Notes (Signed)
HPI Mrs. Bonine returns today for ongoing evaluation of sinus node dysfunction, s/p PPM insertion. She has a h/o carotid stenosis. She denies chest pain or sob. No edema.       Allergies   Allergies  Allergen Reactions   Tape Other (See Comments)    PAPER TAPE -Raw, itching, bleeding skin   Codeine Nausea And Vomiting and Other (See Comments)   Latex Other (See Comments)    Other reaction(s): Other (See Comments) Other   Other Nausea And Vomiting and Other (See Comments)    [name]   Oxycodone-Acetaminophen Other (See Comments)    Other reaction(s): Dizziness (intolerance) Other reaction(s): Dizziness (intolerance) Other reaction(s): Dizziness (intolerance)   Penicillins Other (See Comments)    Heart palpitations Has patient had a PCN reaction causing immediate rash, facial/tongue/throat swelling, SOB or lightheadedness with hypotension: no Has patient had a PCN reaction causing severe rash involving mucus membranes or skin necrosis: no Has patient had a PCN reaction that required hospitalization yes - caused her to get a heart catheterization Has patient had a PCN reaction occurring within the last 10 years: about 22 years ago If all of the above answers are "NO", then may proceed with C   Percodan [Oxycodone-Aspirin] Nausea And Vomiting   Pravastatin Other (See Comments)    Statins cause cramps   Repatha [Evolocumab]     Flu like symptoms   Vancomycin Hives    Other reaction(s): Other (See Comments)   Epinephrine Other (See Comments) and Palpitations    Enhances longevity of Novocaine - severe heart palpatations      Current Outpatient Medications  Medication Sig Dispense Refill   atenolol (TENORMIN) 25 MG tablet Take 25 mg by mouth in the morning and at bedtime.     atorvastatin (LIPITOR) 10 MG tablet Take 10 mg by mouth 3 (three) times a week.     clonazePAM (KLONOPIN) 0.5 MG tablet Take 1 tablet by mouth 2 (two) times daily as needed for anxiety.  2    dorzolamide (TRUSOPT) 2 % ophthalmic solution SMARTSIG:In Eye(s)     ezetimibe (ZETIA) 10 MG tablet Take 1 tablet (10 mg total) by mouth daily. Pt needs to schedule appt with provider for additional refills - 1st attempt 30 tablet 0   fluticasone (FLONASE) 50 MCG/ACT nasal spray Place 1 spray into both nostrils daily as needed for allergies or rhinitis.     guaiFENesin (MUCINEX) 600 MG 12 hr tablet Take 600 mg by mouth 2 (two) times daily as needed for cough.      Multiple Vitamin (MULTIVITAMIN) tablet Take 1 tablet by mouth daily.     nitroGLYCERIN (NITROSTAT) 0.4 MG SL tablet Place 1 tablet (0.4 mg total) under the tongue every 5 (five) minutes as needed for chest pain. 25 tablet 0   Polyethyl Glycol-Propyl Glycol 0.4-0.3 % SOLN Place 1 drop into both eyes daily as needed (For dry eyes.).     No current facility-administered medications for this visit.     Past Medical History:  Diagnosis Date   Acquired absence of breast and nipple    Acute myocardial infarction, unspecified site, episode of care unspecified    1965 and 2015    ALLERGIC RHINITIS    Anginal pain ([place])    Arthritis    Asthma    Blindness of left eye    decreased vision in left eye related to ocular occlusion    Breast cancer ([place])    left mastectomy  Complication of anesthesia    Coronary atherosclerosis    Cough    Dysfunction of eustachian tube    Esophageal reflux    Heart murmur    Hemorrhoids    Hyperlipidemia    Hypertension    Peripheral vascular disease (HCC)    Pneumonia    hx of walking pneumonia x 2    PONV (postoperative nausea and vomiting)    Presence of permanent cardiac pacemaker    PVC's (premature ventricular contractions)    Shortness of breath    Stress incontinence    Syncope 11/17/2013    ROS:   All systems reviewed and negative except as noted in the HPI.   Past Surgical History:  Procedure Laterality Date   ABDOMINAL HYSTERECTOMY  1967   CARDIAC CATHETERIZATION  03/01/1986    normal coronaries (Dr. Domenic Moras)   CARDIAC CATHETERIZATION  10/25/2002   normal L main; LAD w/40% narrowing in prox 3rd and 60-70% narrowing beyond 1st diagonal, LAD was tortuous; dominant RCA with 30-40% segmental narrowing and 20-30% narrowing at junction of prox 3rd (Dr. Marella Chimes)   CARDIAC CATHETERIZATION  04/11/2006   trivial luminal irregularities in coronaries and mid LAD 50% (Dr. Domenic Moras)   CARDIOPULMONARY MET TEST  05/05/2012   excellent effort w/RER 1.06, peak VO2>100%, peak HR 81%, good functional capacity   CAROTID DOPPLER  2005   normal study (ordered for swelling & pain, left neck clavicle to ear)   DILATION AND CURETTAGE OF UTERUS  1962-1968   x4   [place]  2012   bruce myoview -no inducible ischemia, EF 71%, low risk scan   PERMANENT PACEMAKER INSERTION N/A 11/18/2013   Procedure: PERMANENT PACEMAKER INSERTION;  Surgeon: Evans Lance, MD;  Location: [facility] CATH LAB;  Service: Cardiovascular;  Laterality: N/A;   PLACEMENT OF BREAST IMPLANTS  1993   [facility]   REMOVAL OF BILATERAL TISSUE EXPANDERS WITH PLACEMENT OF BILATERAL BREAST IMPLANTS     TOTAL HIP ARTHROPLASTY Right 08/02/2015   Procedure: RIGHT TOTAL HIP ARTHROPLASTY ANTERIOR APPROACH;  Surgeon: Paralee Cancel, MD;  Location: [facility] ORS;  Service: Orthopedics;  Laterality: Right;   TRANSTHORACIC ECHOCARDIOGRAM  2014   EF 42-35%, grade 1 diastolic dysfunction; mildly thickened MV leaflets, trivial regurg      Family History  Problem Relation Age of Onset   Heart attack Brother    Stroke Brother    Lymphoma Sister    Colon cancer Sister    Heart disease Mother    Stroke Mother    Colon cancer Mother 92   Cancer Mother    CAD Brother        + stents   CAD Brother        + stents   Stroke Brother    Cervical cancer Sister    Liver cancer Brother    Heart disease Maternal Grandmother    Stroke Maternal Grandmother    Heart  disease Maternal Grandfather    Cancer Maternal Grandfather    Heart disease Paternal Grandmother    Stroke Paternal Grandfather    Cancer Sister    Cancer Brother      Social History   Socioeconomic History   Marital status: Married    Spouse name: Not on file   Number of children: 1   Years of education: Not on file   Highest education level: Not on  file  Occupational History   Occupation: Advertising account planner  Tobacco Use   Smoking status: Never   Smokeless tobacco: Never  Vaping Use   Vaping Use: Never used  Substance and Sexual Activity   Alcohol use: Yes    Comment: 2 glasses of wine with dinner occasional    Drug use: No   Sexual activity: Not on file  Other Topics Concern   Not on file  Social History Narrative   Not on file   Social Determinants of Health   Financial Resource Strain: Not on file  Food Insecurity: Not on file  Transportation Needs: Not on file  Physical Activity: Not on file  Stress: Not on file  Social Connections: Not on file  Intimate Partner Violence: Not on file     BP 118/74    Pulse 82    Ht 5' 3"  (1.6 m)    Wt 167 lb (75.8 kg)    SpO2 97%    BMI 29.58 kg/m   Physical Exam:  Well appearing NAD HEENT: Unremarkable Neck:  No JVD, no thyromegally Lymphatics:  No adenopathy Back:  No CVA tenderness Lungs:  Clear with no wheezes HEART:  Regular rate rhythm, no murmurs, no rubs, no clicks Abd:  soft, positive bowel sounds, no organomegally, no rebound, no guarding Ext:  2 plus pulses, no edema, no cyanosis, no clubbing Skin:  No rashes no nodules Neuro:  CN II through XII intact, motor grossly intact  EKG - nsr with atrial pacing  DEVICE  Normal device function.  See PaceArt for details.   Assess/Plan:  Sinus node dysfunction - she is asymptomatic s/p PPM insertion HTN - her bp is controlled. No change in meds.  Carleene Overlie Kartel Wolbert,MD

## 2021-04-04 LAB — CUP PACEART INCLINIC DEVICE CHECK
Battery Impedance: 680 Ohm
Battery Remaining Longevity: 75 mo
Battery Voltage: 2.78 V
Brady Statistic AP VP Percent: 0 %
Brady Statistic AP VS Percent: 98 %
Brady Statistic AS VP Percent: 0 %
Brady Statistic AS VS Percent: 2 %
Date Time Interrogation Session: 20230106155000
Implantable Lead Implant Date: 20150826
Implantable Lead Implant Date: 20150826
Implantable Lead Location: 753859
Implantable Lead Location: 753860
Implantable Lead Model: 5076
Implantable Lead Model: 5076
Implantable Pulse Generator Implant Date: 20150826
Lead Channel Impedance Value: 451 Ohm
Lead Channel Impedance Value: 623 Ohm
Lead Channel Pacing Threshold Amplitude: 0.5 V
Lead Channel Pacing Threshold Amplitude: 0.625 V
Lead Channel Pacing Threshold Amplitude: 0.625 V
Lead Channel Pacing Threshold Amplitude: 0.75 V
Lead Channel Pacing Threshold Pulse Width: 0.4 ms
Lead Channel Pacing Threshold Pulse Width: 0.4 ms
Lead Channel Pacing Threshold Pulse Width: 0.4 ms
Lead Channel Pacing Threshold Pulse Width: 0.4 ms
Lead Channel Sensing Intrinsic Amplitude: 11.2 mV
Lead Channel Setting Pacing Amplitude: 2 V
Lead Channel Setting Pacing Amplitude: 2.5 V
Lead Channel Setting Pacing Pulse Width: 0.4 ms
Lead Channel Setting Sensing Sensitivity: 5.6 mV

## 2021-04-06 DIAGNOSIS — J309 Allergic rhinitis, unspecified: Secondary | ICD-10-CM | POA: Diagnosis not present

## 2021-04-06 DIAGNOSIS — F331 Major depressive disorder, recurrent, moderate: Secondary | ICD-10-CM | POA: Diagnosis not present

## 2021-04-06 DIAGNOSIS — R051 Acute cough: Secondary | ICD-10-CM | POA: Diagnosis not present

## 2021-04-06 DIAGNOSIS — J019 Acute sinusitis, unspecified: Secondary | ICD-10-CM | POA: Diagnosis not present

## 2021-04-10 DIAGNOSIS — J069 Acute upper respiratory infection, unspecified: Secondary | ICD-10-CM | POA: Diagnosis not present

## 2021-04-11 DIAGNOSIS — R509 Fever, unspecified: Secondary | ICD-10-CM | POA: Diagnosis not present

## 2021-04-11 DIAGNOSIS — R051 Acute cough: Secondary | ICD-10-CM | POA: Diagnosis not present

## 2021-04-11 DIAGNOSIS — R110 Nausea: Secondary | ICD-10-CM | POA: Diagnosis not present

## 2021-04-11 DIAGNOSIS — R5383 Other fatigue: Secondary | ICD-10-CM | POA: Diagnosis not present

## 2021-05-25 DIAGNOSIS — J45909 Unspecified asthma, uncomplicated: Secondary | ICD-10-CM | POA: Diagnosis not present

## 2021-05-25 DIAGNOSIS — M1611 Unilateral primary osteoarthritis, right hip: Secondary | ICD-10-CM | POA: Diagnosis not present

## 2021-05-25 DIAGNOSIS — E041 Nontoxic single thyroid nodule: Secondary | ICD-10-CM | POA: Diagnosis not present

## 2021-05-25 DIAGNOSIS — I10 Essential (primary) hypertension: Secondary | ICD-10-CM | POA: Diagnosis not present

## 2021-05-25 DIAGNOSIS — E785 Hyperlipidemia, unspecified: Secondary | ICD-10-CM | POA: Diagnosis not present

## 2021-05-25 DIAGNOSIS — J302 Other seasonal allergic rhinitis: Secondary | ICD-10-CM | POA: Diagnosis not present

## 2021-05-25 DIAGNOSIS — I2510 Atherosclerotic heart disease of native coronary artery without angina pectoris: Secondary | ICD-10-CM | POA: Diagnosis not present

## 2021-05-25 DIAGNOSIS — Z950 Presence of cardiac pacemaker: Secondary | ICD-10-CM | POA: Diagnosis not present

## 2021-05-25 DIAGNOSIS — F4322 Adjustment disorder with anxiety: Secondary | ICD-10-CM | POA: Diagnosis not present

## 2021-05-25 DIAGNOSIS — I7090 Unspecified atherosclerosis: Secondary | ICD-10-CM | POA: Diagnosis not present

## 2021-05-25 DIAGNOSIS — M8580 Other specified disorders of bone density and structure, unspecified site: Secondary | ICD-10-CM | POA: Diagnosis not present

## 2021-05-25 DIAGNOSIS — E538 Deficiency of other specified B group vitamins: Secondary | ICD-10-CM | POA: Diagnosis not present

## 2021-06-02 DIAGNOSIS — R7301 Impaired fasting glucose: Secondary | ICD-10-CM | POA: Diagnosis not present

## 2021-06-02 DIAGNOSIS — I10 Essential (primary) hypertension: Secondary | ICD-10-CM | POA: Diagnosis not present

## 2021-06-02 DIAGNOSIS — E785 Hyperlipidemia, unspecified: Secondary | ICD-10-CM | POA: Diagnosis not present

## 2021-06-02 DIAGNOSIS — Z7689 Persons encountering health services in other specified circumstances: Secondary | ICD-10-CM | POA: Diagnosis not present

## 2021-07-18 ENCOUNTER — Other Ambulatory Visit: Payer: Self-pay | Admitting: Internal Medicine

## 2021-07-25 DIAGNOSIS — M25552 Pain in left hip: Secondary | ICD-10-CM | POA: Diagnosis not present

## 2021-08-02 DIAGNOSIS — H401131 Primary open-angle glaucoma, bilateral, mild stage: Secondary | ICD-10-CM | POA: Diagnosis not present

## 2021-08-24 ENCOUNTER — Ambulatory Visit (INDEPENDENT_AMBULATORY_CARE_PROVIDER_SITE_OTHER): Payer: PPO

## 2021-08-24 DIAGNOSIS — I495 Sick sinus syndrome: Secondary | ICD-10-CM | POA: Diagnosis not present

## 2021-08-29 LAB — CUP PACEART REMOTE DEVICE CHECK
Battery Impedance: 860 Ohm
Battery Remaining Longevity: 67 mo
Battery Voltage: 2.78 V
Brady Statistic AP VP Percent: 0 %
Brady Statistic AP VS Percent: 99 %
Brady Statistic AS VP Percent: 0 %
Brady Statistic AS VS Percent: 1 %
Date Time Interrogation Session: 20230605204601
Implantable Lead Implant Date: 20150826
Implantable Lead Implant Date: 20150826
Implantable Lead Location: 753859
Implantable Lead Location: 753860
Implantable Lead Model: 5076
Implantable Lead Model: 5076
Implantable Pulse Generator Implant Date: 20150826
Lead Channel Impedance Value: 458 Ohm
Lead Channel Impedance Value: 636 Ohm
Lead Channel Pacing Threshold Amplitude: 0.625 V
Lead Channel Pacing Threshold Amplitude: 0.625 V
Lead Channel Pacing Threshold Pulse Width: 0.4 ms
Lead Channel Pacing Threshold Pulse Width: 0.4 ms
Lead Channel Setting Pacing Amplitude: 2 V
Lead Channel Setting Pacing Amplitude: 2.5 V
Lead Channel Setting Pacing Pulse Width: 0.4 ms
Lead Channel Setting Sensing Sensitivity: 4 mV

## 2021-09-01 NOTE — Progress Notes (Signed)
Remote pacemaker transmission.   

## 2021-09-15 ENCOUNTER — Other Ambulatory Visit: Payer: Self-pay

## 2021-09-15 ENCOUNTER — Emergency Department (HOSPITAL_COMMUNITY)
Admission: EM | Admit: 2021-09-15 | Discharge: 2021-09-15 | Disposition: A | Discharge status: Home / Self Care | Payer: PPO | Attending: Emergency Medicine | Admitting: Emergency Medicine

## 2021-09-15 ENCOUNTER — Encounter (HOSPITAL_COMMUNITY): Payer: Self-pay

## 2021-09-15 ENCOUNTER — Emergency Department (HOSPITAL_COMMUNITY): Payer: PPO

## 2021-09-15 DIAGNOSIS — Z853 Personal history of malignant neoplasm of breast: Secondary | ICD-10-CM | POA: Insufficient documentation

## 2021-09-15 DIAGNOSIS — R29818 Other symptoms and signs involving the nervous system: Secondary | ICD-10-CM | POA: Diagnosis not present

## 2021-09-15 DIAGNOSIS — R0602 Shortness of breath: Secondary | ICD-10-CM | POA: Insufficient documentation

## 2021-09-15 DIAGNOSIS — R5383 Other fatigue: Secondary | ICD-10-CM | POA: Diagnosis not present

## 2021-09-15 DIAGNOSIS — Z20822 Contact with and (suspected) exposure to covid-19: Secondary | ICD-10-CM | POA: Insufficient documentation

## 2021-09-15 DIAGNOSIS — Z91040 Latex allergy status: Secondary | ICD-10-CM | POA: Insufficient documentation

## 2021-09-15 DIAGNOSIS — I2510 Atherosclerotic heart disease of native coronary artery without angina pectoris: Secondary | ICD-10-CM | POA: Diagnosis not present

## 2021-09-15 DIAGNOSIS — Z79899 Other long term (current) drug therapy: Secondary | ICD-10-CM | POA: Insufficient documentation

## 2021-09-15 DIAGNOSIS — I10 Essential (primary) hypertension: Secondary | ICD-10-CM | POA: Insufficient documentation

## 2021-09-15 DIAGNOSIS — I6523 Occlusion and stenosis of bilateral carotid arteries: Secondary | ICD-10-CM | POA: Diagnosis not present

## 2021-09-15 DIAGNOSIS — R531 Weakness: Secondary | ICD-10-CM | POA: Insufficient documentation

## 2021-09-15 LAB — I-STAT CHEM 8, ED
BUN: 20 mg/dL (ref 8–23)
Calcium, Ion: 1.12 mmol/L — ABNORMAL LOW (ref 1.15–1.40)
Chloride: 107 mmol/L (ref 98–111)
Creatinine, Ser: 0.9 mg/dL (ref 0.44–1.00)
Glucose, Bld: 100 mg/dL — ABNORMAL HIGH (ref 70–99)
HCT: 34 % — ABNORMAL LOW (ref 36.0–46.0)
Hemoglobin: 11.6 g/dL — ABNORMAL LOW (ref 12.0–15.0)
Potassium: 3.8 mmol/L (ref 3.5–5.1)
Sodium: 140 mmol/L (ref 135–145)
TCO2: 21 mmol/L — ABNORMAL LOW (ref 22–32)

## 2021-09-15 LAB — DIFFERENTIAL
Abs Immature Granulocytes: 0.02 10*3/uL (ref 0.00–0.07)
Basophils Absolute: 0 10*3/uL (ref 0.0–0.1)
Basophils Relative: 0 %
Eosinophils Absolute: 0.1 10*3/uL (ref 0.0–0.5)
Eosinophils Relative: 1 %
Immature Granulocytes: 0 %
Lymphocytes Relative: 20 %
Lymphs Abs: 1.1 10*3/uL (ref 0.7–4.0)
Monocytes Absolute: 0.5 10*3/uL (ref 0.1–1.0)
Monocytes Relative: 10 %
Neutro Abs: 3.7 10*3/uL (ref 1.7–7.7)
Neutrophils Relative %: 69 %

## 2021-09-15 LAB — COMPREHENSIVE METABOLIC PANEL
ALT: 47 U/L — ABNORMAL HIGH (ref 0–44)
AST: 38 U/L (ref 15–41)
Albumin: 3.4 g/dL — ABNORMAL LOW (ref 3.5–5.0)
Alkaline Phosphatase: 50 U/L (ref 38–126)
Anion gap: 9 (ref 5–15)
BUN: 20 mg/dL (ref 8–23)
CO2: 22 mmol/L (ref 22–32)
Calcium: 8.7 mg/dL — ABNORMAL LOW (ref 8.9–10.3)
Chloride: 108 mmol/L (ref 98–111)
Creatinine, Ser: 0.94 mg/dL (ref 0.44–1.00)
GFR, Estimated: 59 mL/min — ABNORMAL LOW (ref >60)
Glucose, Bld: 104 mg/dL — ABNORMAL HIGH (ref 70–99)
Potassium: 3.9 mmol/L (ref 3.5–5.1)
Sodium: 139 mmol/L (ref 135–145)
Total Bilirubin: 0.6 mg/dL (ref 0.3–1.2)
Total Protein: 5.5 g/dL — ABNORMAL LOW (ref 6.5–8.1)

## 2021-09-15 LAB — T4, FREE: Free T4: 0.74 ng/dL (ref 0.61–1.12)

## 2021-09-15 LAB — MAGNESIUM: Magnesium: 2.2 mg/dL (ref 1.7–2.4)

## 2021-09-15 LAB — RAPID URINE DRUG SCREEN, HOSP PERFORMED
Amphetamines: NOT DETECTED
Barbiturates: NOT DETECTED
Benzodiazepines: NOT DETECTED
Cocaine: NOT DETECTED
Opiates: NOT DETECTED
Tetrahydrocannabinol: NOT DETECTED

## 2021-09-15 LAB — CBC
HCT: 37.6 % (ref 36.0–46.0)
Hemoglobin: 12.6 g/dL (ref 12.0–15.0)
MCH: 30.1 pg (ref 26.0–34.0)
MCHC: 33.5 g/dL (ref 30.0–36.0)
MCV: 90 fL (ref 80.0–100.0)
Platelets: 164 10*3/uL (ref 150–400)
RBC: 4.18 MIL/uL (ref 3.87–5.11)
RDW: 12.8 % (ref 11.5–15.5)
WBC: 5.4 10*3/uL (ref 4.0–10.5)
nRBC: 0 % (ref 0.0–0.2)

## 2021-09-15 LAB — RESP PANEL BY RT-PCR (FLU A&B, COVID) ARPGX2
Influenza A by PCR: NEGATIVE
Influenza B by PCR: NEGATIVE
SARS Coronavirus 2 by RT PCR: NEGATIVE

## 2021-09-15 LAB — TROPONIN I (HIGH SENSITIVITY)
Troponin I (High Sensitivity): 3 ng/L (ref <18)
Troponin I (High Sensitivity): 3 ng/L (ref <18)

## 2021-09-15 LAB — PROTIME-INR
INR: 1 (ref 0.8–1.2)
Prothrombin Time: 13 seconds (ref 11.4–15.2)

## 2021-09-15 LAB — URINALYSIS, ROUTINE W REFLEX MICROSCOPIC
Bilirubin Urine: NEGATIVE
Glucose, UA: NEGATIVE mg/dL
Hgb urine dipstick: NEGATIVE
Ketones, ur: NEGATIVE mg/dL
Leukocytes,Ua: NEGATIVE
Nitrite: NEGATIVE
Protein, ur: NEGATIVE mg/dL
Specific Gravity, Urine: 1.009 (ref 1.005–1.030)
pH: 6 (ref 5.0–8.0)

## 2021-09-15 LAB — TSH: TSH: 0.957 u[IU]/mL (ref 0.350–4.500)

## 2021-09-15 LAB — ETHANOL: Alcohol, Ethyl (B): <10 mg/dL (ref <10)

## 2021-09-15 LAB — APTT: aPTT: 24 seconds (ref 24–36)

## 2021-09-27 DIAGNOSIS — R4189 Other symptoms and signs involving cognitive functions and awareness: Secondary | ICD-10-CM | POA: Diagnosis not present

## 2021-09-27 DIAGNOSIS — U099 Post covid-19 condition, unspecified: Secondary | ICD-10-CM | POA: Diagnosis not present

## 2021-09-27 DIAGNOSIS — R5383 Other fatigue: Secondary | ICD-10-CM | POA: Diagnosis not present

## 2021-09-27 DIAGNOSIS — K635 Polyp of colon: Secondary | ICD-10-CM | POA: Diagnosis not present

## 2021-09-27 DIAGNOSIS — K921 Melena: Secondary | ICD-10-CM | POA: Diagnosis not present

## 2021-09-27 DIAGNOSIS — R634 Abnormal weight loss: Secondary | ICD-10-CM | POA: Diagnosis not present

## 2021-09-27 DIAGNOSIS — F4322 Adjustment disorder with anxiety: Secondary | ICD-10-CM | POA: Diagnosis not present

## 2021-09-28 NOTE — Progress Notes (Signed)
[facility]  PATIENT: Madison [name] DOB: [birth year]  REFERRING CLINICIAN: Lennice Sites, DO HISTORY FROM: self, daughter Ria Comment REASON FOR VISIT: weakness, numbness, blurred vision   HISTORICAL  CHIEF COMPLAINT:  Chief Complaint  Patient presents with   Weakness, numbness,  blurry vision    Rm 2 New Pt, ED ref, dgtr- [name] "neuropathy in my feet; I have glaucoma which may cause vision issues and had occlusion in Left eye- had injections in my eye; my labs in ED not explained to me"    HISTORY OF PRESENT ILLNESS:  On 09/15/21 she developed blurred vision, diaphoresis, and light headedness. She also noticed some numbness in her left hand when she lifted it above her head. Symptoms lasted for 20 seconds then resolved. She had an RN in her building take her blood pressure and it was high (thinks it was around 196/90). She presented to the ED where [facility] was unremarkable. She was unable to have an MRI due to incompatible pacemaker. She is currently back to her baseline.   She has a history of left CRAO and takes enteric baby aspirin 3-4 times per week. She cannot take it daily because it upsets her stomach. She also takes atorvastatin 10 mg three times a week and Zetia 10 mg daily. Had a TTE 04/2020 which showed an EF of 65% and no shunt.  Notes her husband passed away 2 years ago and she has been under a lot of stress from that. She also had COVID in March 2023 and continues to suffer from persistent fatigue and brain fog.  OTHER MEDICAL CONDITIONS: CAD, MI, s/p pacemaker, HTN, PVCs, glaucoma, CRAO (OS), neuropathy   REVIEW OF SYSTEMS: Full 14 system review of systems performed and negative with exception of: fatigue, blurred vision  ALLERGIES: Allergies  Allergen Reactions   Tape Other (See Comments)    PAPER TAPE -Raw, itching, bleeding skin   Codeine Nausea And Vomiting and Other (See Comments)   Latex Other (See Comments)    Other reaction(s): Other (See  Comments) Other   Other Nausea And Vomiting and Other (See Comments)    [name]   Oxycodone-Acetaminophen Other (See Comments)    Other reaction(s): Dizziness (intolerance) Other reaction(s): Dizziness (intolerance) Other reaction(s): Dizziness (intolerance)   Penicillins Other (See Comments)    Heart palpitations Has patient had a PCN reaction causing immediate rash, facial/tongue/throat swelling, SOB or lightheadedness with hypotension: no Has patient had a PCN reaction causing severe rash involving mucus membranes or skin necrosis: no Has patient had a PCN reaction that required hospitalization yes - caused her to get a heart catheterization Has patient had a PCN reaction occurring within the last 10 years: about 22 years ago If all of the above answers are "NO", then may proceed with C   Percodan [Oxycodone-Aspirin] Nausea And Vomiting   Pravastatin Other (See Comments)    Statins cause cramps   Repatha [Evolocumab]     Flu like symptoms   Vancomycin Hives    Other reaction(s): Other (See Comments)   Epinephrine Other (See Comments) and Palpitations    Enhances longevity of Novocaine - severe heart palpatations     HOME MEDICATIONS: Outpatient Medications Prior to Visit  Medication Sig Dispense Refill   atenolol (TENORMIN) 25 MG tablet Take 25 mg by mouth in the morning and at bedtime.     atorvastatin (LIPITOR) 10 MG tablet Take 10 mg by mouth 3 (three) times a week.     clonazePAM (KLONOPIN) 0.5  MG disintegrating tablet TAKE ONE TABLET UNDER THE TONGUE AND allow TO dissolve TWICE DAILY AS NEEDED     dorzolamide (TRUSOPT) 2 % ophthalmic solution SMARTSIG:In Eye(s)     ezetimibe (ZETIA) 10 MG tablet Take 1 tablet (10 mg total) by mouth daily. 90 tablet 1   fluticasone (FLONASE) 50 MCG/ACT nasal spray Place 1 spray into both nostrils daily as needed for allergies or rhinitis.     Multiple Vitamin (MULTIVITAMIN) tablet Take 1 tablet by mouth daily.     nitroGLYCERIN (NITROSTAT)  0.4 MG SL tablet Place 1 tablet (0.4 mg total) under the tongue every 5 (five) minutes as needed for chest pain. 25 tablet 0   sertraline (ZOLOFT) 50 MG tablet Take 50 mg by mouth daily.     UNABLE TO FIND Med Name: Miralax type med as needed     clonazePAM (KLONOPIN) 0.5 MG tablet Take 1 tablet by mouth 2 (two) times daily as needed for anxiety.  2   guaiFENesin (MUCINEX) 600 MG 12 hr tablet Take 600 mg by mouth 2 (two) times daily as needed for cough.      Polyethyl Glycol-Propyl Glycol 0.4-0.3 % SOLN Place 1 drop into both eyes daily as needed (For dry eyes.).     No facility-administered medications prior to visit.    PAST MEDICAL HISTORY: Past Medical History:  Diagnosis Date   Acquired absence of breast and nipple    Acute myocardial infarction, unspecified site, episode of care unspecified    1965 and 2015    ALLERGIC RHINITIS    Anginal pain (HCC)    Arthritis    Asthma    Blindness of left eye    decreased vision in left eye related to ocular occlusion    Breast cancer ([place])    left mastectomy   Complication of anesthesia    Coronary atherosclerosis    Cough    Dysfunction of eustachian tube    Esophageal reflux    Glaucoma    Heart murmur    Hemorrhoids    Hyperlipidemia    Hypertension    Lymphedema    Pacemaker 2015   Dr Lovena Le   Peripheral vascular disease ([place])    Pneumonia    hx of walking pneumonia x 2    PONV (postoperative nausea and vomiting)    Presence of permanent cardiac pacemaker    PVC's (premature ventricular contractions)    Shortness of breath    Stress incontinence    Syncope 11/17/2013    PAST SURGICAL HISTORY: Past Surgical History:  Procedure Laterality Date   ABDOMINAL HYSTERECTOMY  1967   CARDIAC CATHETERIZATION  03/01/1986   normal coronaries (Dr. Domenic Moras)   CARDIAC CATHETERIZATION  10/25/2002   normal L main; LAD w/40% narrowing in prox 3rd and 60-70% narrowing beyond 1st diagonal, LAD was tortuous; dominant RCA with 30-40%  segmental narrowing and 20-30% narrowing at junction of prox 3rd (Dr. Marella Chimes)   CARDIAC CATHETERIZATION  04/11/2006   trivial luminal irregularities in coronaries and mid LAD 50% (Dr. Domenic Moras)   CARDIOPULMONARY MET TEST  05/05/2012   excellent effort w/RER 1.06, peak VO2>100%, peak HR 81%, good functional capacity   CAROTID DOPPLER  2005   normal study (ordered for swelling & pain, left neck clavicle to ear)   DILATION AND CURETTAGE OF UTERUS  1962-1968   x4   [place]  2012   bruce myoview -no inducible ischemia, EF 71%, low risk scan   PERMANENT PACEMAKER INSERTION N/A 11/18/2013   Procedure: PERMANENT PACEMAKER INSERTION;  Surgeon: Evans Lance, MD;  Location: [facility] CATH LAB;  Service: Cardiovascular;  Laterality: N/A;   PLACEMENT OF BREAST IMPLANTS  1993   [facility]   REMOVAL OF BILATERAL TISSUE EXPANDERS WITH PLACEMENT OF BILATERAL BREAST IMPLANTS     TOTAL HIP ARTHROPLASTY Right 08/02/2015   Procedure: RIGHT TOTAL HIP ARTHROPLASTY ANTERIOR APPROACH;  Surgeon: Paralee Cancel, MD;  Location: [facility] ORS;  Service: Orthopedics;  Laterality: Right;   TRANSTHORACIC ECHOCARDIOGRAM  2014   EF 01-60%, grade 1 diastolic dysfunction; mildly thickened MV leaflets, trivial regurg     FAMILY HISTORY: Family History  Problem Relation Age of Onset   Heart attack Brother    Stroke Brother    Lymphoma Sister    Colon cancer Sister    Heart disease Mother    Stroke Mother    Colon cancer Mother 66   Cancer Mother    CAD Brother        + stents   CAD Brother        + stents   Stroke Brother    Cervical cancer Sister    Liver cancer Brother    Heart disease Maternal Grandmother    Stroke Maternal Grandmother    Heart disease Maternal Grandfather    Cancer Maternal Grandfather    Heart disease Paternal Grandmother    Stroke Paternal Grandfather    Cancer Sister    Cancer Brother     SOCIAL  HISTORY: Social History   Socioeconomic History   Marital status: Widowed    Spouse name: Not on file   Number of children: 1   Years of education: ABD   Highest education level: Master's degree (e.g., MA, MS, MEng, MEd, MSW, MBA)  Occupational History   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  Tobacco Use   Smoking status: Never   Smokeless tobacco: Never  Vaping Use   Vaping Use: Never used  Substance and Sexual Activity   Alcohol use: Yes    Comment: 2 glasses of wine with dinner occasional    Drug use: No   Sexual activity: Not on file  Other Topics Concern   Not on file  Social History Narrative   09/29/21 lives a [place]   Social Determinants of Health   Financial Resource Strain: Not on file  Food Insecurity: Not on file  Transportation Needs: Not on file  Physical Activity: Not on file  Stress: Not on file  Social Connections: Not on file  Intimate Partner Violence: Not on file     PHYSICAL EXAM   GENERAL EXAM/CONSTITUTIONAL: Vitals:  Vitals:   09/29/21 0916  BP: 119/69  Pulse: 69  Weight: 166 lb (75.3 kg)  Height: _0  (1.6 m)   Body mass index is 29.41 kg/m. Wt Readings from Last 3 Encounters:  09/29/21 166 lb (75.3 kg)  09/15/21 157 lb (71.2 kg)  03/31/21 167 lb (75.8 kg)     NEUROLOGIC: MENTAL STATUS:  awake, alert, oriented to person, place and time recent and remote memory intact normal attention and concentration   CRANIAL NERVE:  2nd, 3rd, 4th, 6th - pupils equal and reactive to light, visual fields full to confrontation, extraocular muscles intact, no nystagmus 5th - facial sensation symmetric 7th - facial strength symmetric 8th - hearing intact 9th - palate elevates symmetrically, uvula midline 11th -  shoulder shrug symmetric 12th - tongue protrusion midline  MOTOR:  normal bulk and tone, full strength in the BUE, BLE. Resting head tremor present (states she has had this for several years)  SENSORY:   normal and symmetric to light touch all 4 extremities  COORDINATION:  finger-nose-finger intact bilaterally  REFLEXES:  deep tendon reflexes present and symmetric  GAIT/STATION:  normal     DIAGNOSTIC DATA (LABS, IMAGING, TESTING) - I reviewed patient records, labs, notes, testing and imaging myself where available.  Lab Results  Component Value Date   WBC 5.4 09/15/2021   HGB 11.6 (L) 09/15/2021   HCT 34.0 (L) 09/15/2021   MCV 90.0 09/15/2021   PLT 164 09/15/2021      Component Value Date/Time   NA 140 09/15/2021 1517   NA 140 12/19/2018 1033   K 3.8 09/15/2021 1517   CL 107 09/15/2021 1517   CO2 22 09/15/2021 1503   GLUCOSE 100 (H) 09/15/2021 1517   BUN 20 09/15/2021 1517   BUN 15 12/19/2018 1033   CREATININE 0.90 09/15/2021 1517   CALCIUM 8.7 (L) 09/15/2021 1503   PROT 5.5 (L) 09/15/2021 1503   ALBUMIN 3.4 (L) 09/15/2021 1503   AST 38 09/15/2021 1503   ALT 47 (H) 09/15/2021 1503   ALKPHOS 50 09/15/2021 1503   BILITOT 0.6 09/15/2021 1503   GFRNONAA 59 (L) 09/15/2021 1503   GFRAA 53 (L) 12/19/2018 1033   Lab Results  Component Value Date   CHOL 176 10/28/2014   HDL 42 10/28/2014   LDLCALC 109 (H) 10/28/2014   TRIG 126 10/28/2014   CHOLHDL 4.2 10/28/2014   Lab Results  Component Value Date   HGBA1C 5.8 (H) 10/28/2014   No results found for: "VITAMINB12" Lab Results  Component Value Date   TSH 0.957 09/15/2021    CTH 09/15/21: No acute intracranial abnormalities identified. No cause for the patient's symptoms identified. Calcified atherosclerotic change in the intracranial carotids.  ASSESSMENT AND PLAN  88 y.o. year old female with a history of CAD, MI, s/p pacemaker, HTN, PVCs, glaucoma, CRAO (OS) who presents for evaluation of an episode of blurred vision, light headedness, and left hand numbness. CTH in the ED showed no acute process. Suspect her symptoms may have been secondary to her elevated blood pressure. Left hand numbness sounds like it  was positional (hand was behind her head). However given her significant vascular risk factors and carotid atherosclerosis seen on CTH, will order CTA head/neck to complete TIA workup. She believes she has had recent A1c and lipid panel through her PCP, will have her send recent blood work to our office for review. Advised her to continue her ASA and statin/ezetimibe for stroke prevention.    PLAN: -CTA head/neck -She will have recent blood work sent from PCP office to see if lipid panel and A1c have been done  Orders Placed This Encounter  Procedures   CT Keeler     Genia Harold, MD 09/29/21 12:01 PM  I spent an average of 47 minutes chart reviewing and counseling the patient, with at least 50% of the time face to face with the patient.   [facility] [address] [place], [place] [address] [phone]

## 2021-09-29 ENCOUNTER — Encounter: Payer: Self-pay | Admitting: Psychiatry

## 2021-09-29 ENCOUNTER — Ambulatory Visit: Payer: Commercial Managed Care - HMO | Admitting: Psychiatry

## 2021-09-29 VITALS — BP 119/69 | HR 69 | Ht 63.0 in | Wt 166.0 lb

## 2021-09-29 DIAGNOSIS — R202 Paresthesia of skin: Secondary | ICD-10-CM | POA: Diagnosis not present

## 2021-09-29 DIAGNOSIS — H538 Other visual disturbances: Secondary | ICD-10-CM

## 2021-09-29 DIAGNOSIS — G459 Transient cerebral ischemic attack, unspecified: Secondary | ICD-10-CM | POA: Diagnosis not present

## 2021-09-29 DIAGNOSIS — R200 Anesthesia of skin: Secondary | ICD-10-CM | POA: Diagnosis not present

## 2021-09-29 NOTE — Patient Instructions (Addendum)
CT scan of blood vessels in the head and neck Please have family doctor send your recent blood work to our office

## 2021-10-02 ENCOUNTER — Telehealth: Payer: Self-pay | Admitting: Psychiatry

## 2021-10-02 NOTE — Telephone Encounter (Signed)
[employer] NPR sent to GI

## 2021-10-03 DIAGNOSIS — Z800 Family history of malignant neoplasm of digestive organs: Secondary | ICD-10-CM | POA: Diagnosis not present

## 2021-10-03 DIAGNOSIS — K5730 Diverticulosis of large intestine without perforation or abscess without bleeding: Secondary | ICD-10-CM | POA: Diagnosis not present

## 2021-10-03 DIAGNOSIS — Z86010 Personal history of colonic polyps: Secondary | ICD-10-CM | POA: Diagnosis not present

## 2021-10-03 DIAGNOSIS — K5904 Chronic idiopathic constipation: Secondary | ICD-10-CM | POA: Diagnosis not present

## 2021-10-03 DIAGNOSIS — K625 Hemorrhage of anus and rectum: Secondary | ICD-10-CM | POA: Diagnosis not present

## 2021-10-04 ENCOUNTER — Ambulatory Visit
Admission: RE | Admit: 2021-10-04 | Discharge: 2021-10-04 | Disposition: A | Discharge status: Home / Self Care | Payer: PPO | Source: Ambulatory Visit | Attending: Psychiatry | Admitting: Psychiatry

## 2021-10-04 DIAGNOSIS — G459 Transient cerebral ischemic attack, unspecified: Secondary | ICD-10-CM | POA: Diagnosis not present

## 2021-10-04 DIAGNOSIS — E041 Nontoxic single thyroid nodule: Secondary | ICD-10-CM | POA: Diagnosis not present

## 2021-10-04 DIAGNOSIS — M47812 Spondylosis without myelopathy or radiculopathy, cervical region: Secondary | ICD-10-CM | POA: Diagnosis not present

## 2021-10-04 DIAGNOSIS — I771 Stricture of artery: Secondary | ICD-10-CM | POA: Diagnosis not present

## 2021-10-04 DIAGNOSIS — I63233 Cerebral infarction due to unspecified occlusion or stenosis of bilateral carotid arteries: Secondary | ICD-10-CM | POA: Diagnosis not present

## 2021-10-04 DIAGNOSIS — I672 Cerebral atherosclerosis: Secondary | ICD-10-CM | POA: Diagnosis not present

## 2021-10-04 MED ORDER — IOPAMIDOL (ISOVUE-370) INJECTION 76%
75.0000 mL | Freq: Once | INTRAVENOUS | Status: AC | PRN
  Administered 2021-10-04: 75 mL via INTRAVENOUS

## 2021-10-09 ENCOUNTER — Encounter: Payer: Self-pay | Admitting: Psychiatry

## 2021-10-09 ENCOUNTER — Telehealth: Payer: Self-pay | Admitting: Psychiatry

## 2021-10-09 NOTE — Telephone Encounter (Signed)
Contacted pt back, pt saw the impression of results and had concerns of imagining stating there is calcification in her vessels, carotid artery are blocked And other concerns of the impression. If this is so, how is it normal. Please requested MD contact her to clarify the results.

## 2021-10-09 NOTE — Telephone Encounter (Signed)
Pt is calling about CT results and requesting a return call.

## 2021-11-07 DIAGNOSIS — Z853 Personal history of malignant neoplasm of breast: Secondary | ICD-10-CM | POA: Diagnosis not present

## 2021-11-07 DIAGNOSIS — C50912 Malignant neoplasm of unspecified site of left female breast: Secondary | ICD-10-CM | POA: Diagnosis not present

## 2021-11-22 DIAGNOSIS — Z853 Personal history of malignant neoplasm of breast: Secondary | ICD-10-CM | POA: Diagnosis not present

## 2021-11-22 DIAGNOSIS — C50912 Malignant neoplasm of unspecified site of left female breast: Secondary | ICD-10-CM | POA: Diagnosis not present

## 2021-11-23 ENCOUNTER — Ambulatory Visit (INDEPENDENT_AMBULATORY_CARE_PROVIDER_SITE_OTHER): Payer: PPO

## 2021-11-23 DIAGNOSIS — I495 Sick sinus syndrome: Secondary | ICD-10-CM

## 2021-11-24 ENCOUNTER — Telehealth: Payer: Self-pay | Admitting: Internal Medicine

## 2021-11-24 NOTE — Telephone Encounter (Signed)
Spoke with patient letting her know her transmission came in. I let her know a nurse will review it and if everything looks good then no news is good news. Patient verbalized understanding

## 2021-11-24 NOTE — Telephone Encounter (Signed)
  1. Has your device fired? no  2. Is you device beeping? no  3. Are you experiencing draining or swelling at device site? no  4. Are you calling to see if we received your device transmission? Yes, patient is sending one today  5. Have you passed out? no    Please route to [place]

## 2021-11-28 DIAGNOSIS — E041 Nontoxic single thyroid nodule: Secondary | ICD-10-CM | POA: Diagnosis not present

## 2021-11-28 DIAGNOSIS — E785 Hyperlipidemia, unspecified: Secondary | ICD-10-CM | POA: Diagnosis not present

## 2021-11-28 DIAGNOSIS — M8580 Other specified disorders of bone density and structure, unspecified site: Secondary | ICD-10-CM | POA: Diagnosis not present

## 2021-11-28 DIAGNOSIS — R7301 Impaired fasting glucose: Secondary | ICD-10-CM | POA: Diagnosis not present

## 2021-11-28 DIAGNOSIS — R7989 Other specified abnormal findings of blood chemistry: Secondary | ICD-10-CM | POA: Diagnosis not present

## 2021-11-28 DIAGNOSIS — I10 Essential (primary) hypertension: Secondary | ICD-10-CM | POA: Diagnosis not present

## 2021-11-28 DIAGNOSIS — E538 Deficiency of other specified B group vitamins: Secondary | ICD-10-CM | POA: Diagnosis not present

## 2021-11-28 DIAGNOSIS — R5383 Other fatigue: Secondary | ICD-10-CM | POA: Diagnosis not present

## 2021-11-28 LAB — CUP PACEART REMOTE DEVICE CHECK
Battery Impedance: 966 Ohm
Battery Remaining Longevity: 62 mo
Battery Voltage: 2.78 V
Brady Statistic AP VP Percent: 0 %
Brady Statistic AP VS Percent: 99 %
Brady Statistic AS VP Percent: 0 %
Brady Statistic AS VS Percent: 1 %
Date Time Interrogation Session: 20230901130101
Implantable Lead Implant Date: 20150826
Implantable Lead Implant Date: 20150826
Implantable Lead Location: 753859
Implantable Lead Location: 753860
Implantable Lead Model: 5076
Implantable Lead Model: 5076
Implantable Pulse Generator Implant Date: 20150826
Lead Channel Impedance Value: 464 Ohm
Lead Channel Impedance Value: 682 Ohm
Lead Channel Pacing Threshold Amplitude: 0.625 V
Lead Channel Pacing Threshold Amplitude: 0.625 V
Lead Channel Pacing Threshold Pulse Width: 0.4 ms
Lead Channel Pacing Threshold Pulse Width: 0.4 ms
Lead Channel Setting Pacing Amplitude: 2 V
Lead Channel Setting Pacing Amplitude: 2.5 V
Lead Channel Setting Pacing Pulse Width: 0.4 ms
Lead Channel Setting Sensing Sensitivity: 5.6 mV

## 2021-12-05 DIAGNOSIS — Z950 Presence of cardiac pacemaker: Secondary | ICD-10-CM | POA: Diagnosis not present

## 2021-12-05 DIAGNOSIS — I7090 Unspecified atherosclerosis: Secondary | ICD-10-CM | POA: Diagnosis not present

## 2021-12-05 DIAGNOSIS — F4322 Adjustment disorder with anxiety: Secondary | ICD-10-CM | POA: Diagnosis not present

## 2021-12-05 DIAGNOSIS — E041 Nontoxic single thyroid nodule: Secondary | ICD-10-CM | POA: Diagnosis not present

## 2021-12-05 DIAGNOSIS — Z0000 Encounter for general adult medical examination without abnormal findings: Secondary | ICD-10-CM | POA: Diagnosis not present

## 2021-12-05 DIAGNOSIS — I6529 Occlusion and stenosis of unspecified carotid artery: Secondary | ICD-10-CM | POA: Diagnosis not present

## 2021-12-05 DIAGNOSIS — Z1339 Encounter for screening examination for other mental health and behavioral disorders: Secondary | ICD-10-CM | POA: Diagnosis not present

## 2021-12-05 DIAGNOSIS — I10 Essential (primary) hypertension: Secondary | ICD-10-CM | POA: Diagnosis not present

## 2021-12-05 DIAGNOSIS — I2510 Atherosclerotic heart disease of native coronary artery without angina pectoris: Secondary | ICD-10-CM | POA: Diagnosis not present

## 2021-12-05 DIAGNOSIS — E785 Hyperlipidemia, unspecified: Secondary | ICD-10-CM | POA: Diagnosis not present

## 2021-12-05 DIAGNOSIS — R82998 Other abnormal findings in urine: Secondary | ICD-10-CM | POA: Diagnosis not present

## 2021-12-05 DIAGNOSIS — K449 Diaphragmatic hernia without obstruction or gangrene: Secondary | ICD-10-CM | POA: Diagnosis not present

## 2021-12-05 DIAGNOSIS — Z1331 Encounter for screening for depression: Secondary | ICD-10-CM | POA: Diagnosis not present

## 2021-12-05 DIAGNOSIS — I872 Venous insufficiency (chronic) (peripheral): Secondary | ICD-10-CM | POA: Diagnosis not present

## 2021-12-05 DIAGNOSIS — M1611 Unilateral primary osteoarthritis, right hip: Secondary | ICD-10-CM | POA: Diagnosis not present

## 2021-12-06 DIAGNOSIS — Z853 Personal history of malignant neoplasm of breast: Secondary | ICD-10-CM | POA: Diagnosis not present

## 2021-12-06 DIAGNOSIS — C50912 Malignant neoplasm of unspecified site of left female breast: Secondary | ICD-10-CM | POA: Diagnosis not present

## 2021-12-14 NOTE — Progress Notes (Signed)
Remote pacemaker transmission.   

## 2021-12-26 DIAGNOSIS — Z23 Encounter for immunization: Secondary | ICD-10-CM | POA: Diagnosis not present

## 2022-02-22 ENCOUNTER — Ambulatory Visit (INDEPENDENT_AMBULATORY_CARE_PROVIDER_SITE_OTHER): Payer: PPO

## 2022-02-22 DIAGNOSIS — I495 Sick sinus syndrome: Secondary | ICD-10-CM

## 2022-02-23 LAB — CUP PACEART REMOTE DEVICE CHECK
Battery Impedance: 1071 Ohm
Battery Remaining Longevity: 58 mo
Battery Voltage: 2.77 V
Brady Statistic AP VP Percent: 0 %
Brady Statistic AP VS Percent: 99 %
Brady Statistic AS VP Percent: 0 %
Brady Statistic AS VS Percent: 1 %
Date Time Interrogation Session: 20231201081330
Implantable Lead Connection Status: 753985
Implantable Lead Connection Status: 753985
Implantable Lead Implant Date: 20150826
Implantable Lead Implant Date: 20150826
Implantable Lead Location: 753859
Implantable Lead Location: 753860
Implantable Lead Model: 5076
Implantable Lead Model: 5076
Implantable Pulse Generator Implant Date: 20150826
Lead Channel Impedance Value: 445 Ohm
Lead Channel Impedance Value: 615 Ohm
Lead Channel Pacing Threshold Amplitude: 0.5 V
Lead Channel Pacing Threshold Amplitude: 0.5 V
Lead Channel Pacing Threshold Pulse Width: 0.4 ms
Lead Channel Pacing Threshold Pulse Width: 0.4 ms
Lead Channel Setting Pacing Amplitude: 2 V
Lead Channel Setting Pacing Amplitude: 2.5 V
Lead Channel Setting Pacing Pulse Width: 0.4 ms
Lead Channel Setting Sensing Sensitivity: 4 mV
Zone Setting Status: 755011
Zone Setting Status: 755011

## 2022-03-07 ENCOUNTER — Encounter: Payer: Self-pay | Admitting: Internal Medicine

## 2022-03-07 ENCOUNTER — Ambulatory Visit: Payer: PPO | Attending: Internal Medicine | Admitting: Internal Medicine

## 2022-03-07 VITALS — BP 124/88 | HR 64 | Ht 63.0 in | Wt 162.2 lb

## 2022-03-07 DIAGNOSIS — R0602 Shortness of breath: Secondary | ICD-10-CM

## 2022-03-07 NOTE — Progress Notes (Signed)
Cardiology Office Note   Date:  03/07/2022   ID:  Maisee, Vollman May 24, 1933, MRN [id number]  PCP:  Crist Infante, MD  Cardiologist:   Dorris Carnes, MD   F/U of HTN and CAD    History of Present Illness: Madison [name] is a 88 y.o. female with a history of mild to mod CAD  Myovue in 2015 normal Echo showed normal LVEF Cardopulmonary stress test consistent with deconditioning.   The pt also has hx of HTN, PVCs, GERD, HL  And she is  s/p PPM in 2015 for SN dysfunction    After I saw her last she had an echo done   LVEF / RVEF normal      I saw the pt in clinic in Jan 2022  Since then she denies CP   Breathing is OK   No palpitatoins.   No dizziness   The pt called in saying she was SOB with activity    Started after she had a viral infection earlier this year    Has to rest more with activity   Says she gets lightheaded occasionally  Does not bend  over any more to do things  Has bad swelling in L leg      New since she had the flu earlier this year  She denies CP  Denies palpitations      Allergies:   Tape, Codeine, Latex, Other, Oxycodone-acetaminophen, Penicillins, Percodan [oxycodone-aspirin], Pravastatin, Repatha [evolocumab], Vancomycin, and Epinephrine   Past Medical History:  Diagnosis Date   Acquired absence of breast and nipple    Acute myocardial infarction, unspecified site, episode of care unspecified    1965 and 2015    ALLERGIC RHINITIS    Anginal pain (HCC)    Arthritis    Asthma    Blindness of left eye    decreased vision in left eye related to ocular occlusion    Breast cancer ([place])    left mastectomy   Complication of anesthesia    Coronary atherosclerosis    Cough    Dysfunction of eustachian tube    Esophageal reflux    Glaucoma    Heart murmur    Hemorrhoids    Hyperlipidemia    Hypertension    Lymphedema    Pacemaker 2015   Dr Lovena Le   Peripheral vascular disease ([place])    Pneumonia    hx of walking pneumonia x 2    PONV (postoperative  nausea and vomiting)    Presence of permanent cardiac pacemaker    PVC's (premature ventricular contractions)    Shortness of breath    Stress incontinence    Syncope 11/17/2013    Past Surgical History:  Procedure Laterality Date   ABDOMINAL HYSTERECTOMY  1967   CARDIAC CATHETERIZATION  03/01/1986   normal coronaries (Dr. Domenic Moras)   CARDIAC CATHETERIZATION  10/25/2002   normal L main; LAD w/40% narrowing in prox 3rd and 60-70% narrowing beyond 1st diagonal, LAD was tortuous; dominant RCA with 30-40% segmental narrowing and 20-30% narrowing at junction of prox 3rd (Dr. Marella Chimes)   CARDIAC CATHETERIZATION  04/11/2006   trivial luminal irregularities in coronaries and mid LAD 50% (Dr. Domenic Moras)   CARDIOPULMONARY MET TEST  05/05/2012   excellent effort w/RER 1.06, peak VO2>100%, peak HR 81%, good functional capacity   CAROTID DOPPLER  2005   normal study (ordered for swelling & pain, left neck clavicle to ear)   DILATION AND CURETTAGE OF UTERUS  [phone]  x4   GALLBLADDER SURGERY  1985   MASTECTOMY Left 1981   MYOMECTOMY  1968   NM MYOCAR PERF WALL MOTION  2012   bruce myoview -no inducible ischemia, EF 71%, low risk scan   PERMANENT PACEMAKER INSERTION N/A 11/18/2013   Procedure: PERMANENT PACEMAKER INSERTION;  Surgeon: Evans Lance, MD;  Location: [facility] CATH LAB;  Service: Cardiovascular;  Laterality: N/A;   PLACEMENT OF BREAST IMPLANTS  1993   [facility]   REMOVAL OF BILATERAL TISSUE EXPANDERS WITH PLACEMENT OF BILATERAL BREAST IMPLANTS     TOTAL HIP ARTHROPLASTY Right 08/02/2015   Procedure: RIGHT TOTAL HIP ARTHROPLASTY ANTERIOR APPROACH;  Surgeon: Paralee Cancel, MD;  Location: [facility] ORS;  Service: Orthopedics;  Laterality: Right;   TRANSTHORACIC ECHOCARDIOGRAM  2014   EF 88-41%, grade 1 diastolic dysfunction; mildly thickened MV leaflets, trivial regurg      Social History:  The patient  reports that she has never smoked. She has never used smokeless tobacco. She reports current  alcohol use. She reports that she does not use drugs.   Family History:  The patient's family history includes CAD in her brother and brother; Cancer in her brother, maternal grandfather, mother, and sister; Cervical cancer in her sister; Colon cancer in her sister; Colon cancer (age of onset: 7) in her mother; Heart attack in her brother; Heart disease in her maternal grandfather, maternal grandmother, mother, and paternal grandmother; Liver cancer in her brother; Lymphoma in her sister; Stroke in her brother, brother, maternal grandmother, mother, and paternal grandfather.    ROS:  Please see the history of present illness. All other systems are reviewed and  Negative to the above problem except as noted.    PHYSICAL EXAM: VS:  BP 124/88   Pulse 64   Ht _0  (1.6 m)   Wt 162 lb 3.2 oz (73.6 kg)   SpO2 95%   BMI 28.73 kg/m     GEN: Well nourished, well developed, in no acute distress  HEENT: normal  Neck: no JVD, carotid bruit Cardiac: RRR; no murmurs  Madison [name]  Respiratory:  clear to auscultation bilaterally,  GI: soft, nontender, nondistended, + BS  No hepatomegaly  MS: no deformity Moving all extremities   Skin: warm and dry, no rash Neuro:  Strength and sensation are intact Psych: euthymic mood, full affect   EKG:  EKG is not  ordered today.    Echo   2/22  Left ventricular ejection fraction, by estimation, is 60 to 65%. Left ventricular ejection fraction by 3D volume is 66 %. The left ventricle has normal function. The left ventricle has no regional wall motion abnormalities. There is mild concentric left ventricular hypertrophy. The average left ventricular global longitudinal strain is -20.2 %. The global longitudinal strain is normal, though regional strain pattern is notable for diminished inferoseptal base deformation. In some views (A4C) there appears to be a small echo dense trabeculation in the apex. Normal wall motion. Would consider Echo Contrast  Study if clinically indicated. 2. Right ventricular systolic function is normal. The right ventricular size is normal. There is normal pulmonary artery systolic pressure. The estimated right ventricular systolic pressure is 66.0 mmHg. 3. The mitral valve is grossly normal. No evidence of mitral valve regurgitation. 4. The aortic valve is tricuspid. Aortic valve regurgitation is not visualized. 5. Aortic dilatation noted. There is mild dilatation of the ascending aorta, measuring 39 mm. Comparison(s): A prior study was performed on 10/28/2014. Compared to report and still  images from prior study, similar study.  Carotid USN  04/2020  Right Carotid: The extracranial vessels were near-normal with only minimal wall thickening or plaque. Left Carotid: There was no evidence of thrombus, dissection, atherosclerotic plaque or stenosis in the cervical carotid system. Final Madison [name] [id number] 05/06/2020 Incidental finding of bilateral thyroid nodules, both are hyperechoic in echo texture. Right dominant nodule 2.4 x 1.5 x 1.4 cm with small calcification within. Left inferior pole nodule .9 x .8 x .7 cm. Dedicated thyroid ultrasound is recommended if clinically warranted. *See table(s) above for measurements and observations. Electronically signed by Kathlyn Sacramento MD on 05/07/2020 at 2:59:39 PM. Vertebrals: Bilateral vertebral arteries demonstrate antegrade flow. Subclavians: Normal flow hemodynamics were seen in bilateral subclavian arteries. Lipid Panel    Component Value Date/Time   CHOL 176 10/28/2014 0440   TRIG 126 10/28/2014 0440   HDL 42 10/28/2014 0440   CHOLHDL 4.2 10/28/2014 0440   VLDL 25 10/28/2014 0440   LDLCALC 109 (H) 10/28/2014 0440      Wt Readings from Last 3 Encounters:  03/07/22 162 lb 3.2 oz (73.6 kg)  09/29/21 166 lb (75.3 kg)  09/15/21 157 lb (71.2 kg)      ASSESSMENT AND PLAN:   1   Dyspnea  New this pasat year  Will get echo to reconfirm LVEF   IMay  consider CT angiogram  2  HTN  BP is controlled  Follow    2 Hx of CAD  As noted above  consider CT    3  Hx PPM  WIll make sure she has f/u  in EP   4  Hx dizziness  More than in past   Stay hydrated     5  Lipids  Last lipids LDL91HDL 55  Need to address    6  CV dz   minimal   From a cardiac standpoint I think pt is at low risk for cardiac complication from colonscopy and OK to proceed    Current medicines are reviewed at length with the patient today.  The patient does not have concerns regarding medicines.  Signed, Dorris Carnes, MD  03/07/2022 9:57 AM    [address] [place], [place],   [address] Phone: [phone]; Fax: [phone]

## 2022-03-07 NOTE — Patient Instructions (Signed)
Medication Instructions:   *If you need a refill on your cardiac medications before your next appointment, please call your pharmacy*   Lab Work:  If you have labs (blood work) drawn today and your tests are completely normal, you will receive your results only by: [place] (if you have MyChart) OR A paper copy in the mail If you have any lab test that is abnormal or we need to change your treatment, we will call you to review the results.   Testing/Procedures: Your physician has requested that you have an echocardiogram. Echocardiography is a painless test that uses sound waves to create images of your heart. It provides your doctor with information about the size and shape of your heart and how well your heart's chambers and valves are working. This procedure takes approximately one hour. There are no restrictions for this procedure. Please do NOT wear cologne, perfume, aftershave, or lotions (deodorant is allowed). Please arrive 15 minutes prior to your appointment time.    Follow-Up: At [facility], you and your health needs are our priority.  As part of our continuing mission to provide you with exceptional heart care, we have created designated Provider Care Teams.  These Care Teams include your primary Cardiologist (physician) and Advanced Practice Providers (APPs -  Physician Assistants and Nurse Practitioners) who all work together to provide you with the care you need, when you need it.  We recommend signing up for the patient portal called "MyChart".  Sign up information is provided on this After Visit Summary.  MyChart is used to connect with patients for Virtual Visits (Telemedicine).  Patients are able to view lab/test results, encounter notes, upcoming appointments, etc.  Non-urgent messages can be sent to your provider as well.   To learn more about what you can do with MyChart, go to [email or web address].    Your next appointment:   3 month(s)  The  format for your next appointment:   In Person  Provider:   Dorris Carnes, MD     Other Instructions   Important Information About Sugar

## 2022-03-14 NOTE — Progress Notes (Signed)
Remote pacemaker transmission.   

## 2022-03-27 DIAGNOSIS — H1132 Conjunctival hemorrhage, left eye: Secondary | ICD-10-CM | POA: Diagnosis not present

## 2022-04-02 ENCOUNTER — Ambulatory Visit: Payer: PPO | Admitting: Internal Medicine

## 2022-04-05 ENCOUNTER — Ambulatory Visit (HOSPITAL_COMMUNITY): Payer: PPO | Attending: Internal Medicine

## 2022-04-05 DIAGNOSIS — R0602 Shortness of breath: Secondary | ICD-10-CM | POA: Diagnosis not present

## 2022-04-05 LAB — ECHOCARDIOGRAM COMPLETE
Area-P 1/2: 3.06 cm2
S' Lateral: 2.35 cm

## 2022-04-10 ENCOUNTER — Telehealth: Payer: Self-pay

## 2022-04-10 MED ORDER — ATENOLOL 25 MG PO TABS: ORAL_TABLET | ORAL | 3 refills | Status: DC

## 2022-04-10 NOTE — Telephone Encounter (Signed)
Will make med changes and forward to the Pharm D.

## 2022-04-10 NOTE — Telephone Encounter (Signed)
Transmission received. 

## 2022-04-10 NOTE — Telephone Encounter (Signed)
Reviewed with patient  Biggest feeling is at night "wooshing" sensation   Feels unsteady     Patient says her SOB is not like in December     She is under less pressure  now   Still has some   She has been on atenolol 25 bid    Will increase to 25/12.5/25 and follow BP   IT has made her feel better in past   ON sertraline 25   Wonders if there is something less intense  Check with pharmacy

## 2022-04-10 NOTE — Telephone Encounter (Signed)
$'25mg'N$  is the lowest dose sertraline comes in. Recommend she follow up with her PCP or managing provider regarding other replacement meds if she's interested in trying something else instead of sertraline.

## 2022-04-10 NOTE — Telephone Encounter (Signed)
Reviewed transmission. Normal device function, no correlating episodes.  Of note, patient appears to have some recent increase in Vpacing. Also, the sensation she is feeling at nighttime may be her pacing threshold test that runs nightly between midnight and 4am.  Her PPM will not produce any shock action, but she may be feeling some pacing she is not accustomed to.   Patient doesn't usually pace in the ventricle and as such may be a little more sensitive to this when it occurs.  I did speak with patient and conveyed above information.  She did state that she mostly wakes up with the sensation at night so that would make sense to her. Also, in recent days feels the sensation more during the day which correlates with recent V pacing event.   Will review with EP provider in office tomorrow and follow up with patient to consider if bringing patient in to review programming or evaluation for potential stim may be helpful.

## 2022-04-10 NOTE — Telephone Encounter (Addendum)
I spoke with the pt and she reports that her Pacemaker has been giving her a "shocking" feeling in her chest... she is worried there is problem with it... I advised her that I will forward to our device nurses for review.   She also reports feeling lethargic and having some vertigo today... no headache.. no chest pain.   Her BP readings:   129/67 129/65 129/88 141/89 122/73  1/14 140/96 HR 83 1/15 119/83 HR 69 1/16 147/91 HR 77   this was pre meds.   She will take her BP in an hour or so and record it for Dr Harrington Challenger.   I will call her to follow up with her.

## 2022-04-10 NOTE — Telephone Encounter (Signed)
-----  Message from Fay Records, MD sent at 04/06/2022  3:08 PM EST ----- Left VM with patient that echo looked good.  Normal LV/RV function

## 2022-04-17 NOTE — Telephone Encounter (Signed)
LM for patient to call back to discuss:  Reviewed transmission and symptoms (whooshing) with Dr. Lovena Le.  Device function normal. VP rate is baseline for patient and is 0.1%.  Possibly related to pacing threshold testing at night but nothing to be concerned about.   Patient due for annual follow up with Dr. Lovena Le in July.  She should follow advice below as discussed with her from Dr. Harrington Challenger and Fuller Canada, PharmD.

## 2022-04-17 NOTE — Telephone Encounter (Signed)
Call back received from Pt.  Advised per previous documentation.  She was relieved it was not an issue.  She does not want any changes to her pacemaker.

## 2022-04-19 ENCOUNTER — Ambulatory Visit: Payer: PPO | Admitting: Psychiatry

## 2022-04-30 NOTE — Progress Notes (Unsigned)
Cardiology Office Note   Date:  05/01/2022   ID:  Madison [name], Madison [name] 08-12-1933, MRN [id number]  PCP:  Crist Infante, MD  Cardiologist:   Dorris Carnes, MD   F/U of HTN and CAD    History of Present Illness: Madison [name] is a 89 y.o. female with a history of mild to mod CAD  Myovue in 2015 normal Echo showed normal LVEF Cardopulmonary stress test consistent with deconditioning.   The pt also has hx of HTN, PVCs, GERD, HL  And she is  s/p PPM in 2015 for SN dysfunction    Echo in Feb 2022 showed  LVEF / RVEF normal     I saw the pt in clinic in Dec 2023    At that visit she complained of dyspnea with activity   She was under increased stress    Echo done   Normal LVEF and RVEF  Mld diastolic dysfunction  Since seen she still gets winded if she walks fast   She deneis CP     No dizziness  Allergies:   Tape, Codeine, Latex, Other, Oxycodone-acetaminophen, Penicillins, Percodan [oxycodone-aspirin], Pravastatin, Repatha [evolocumab], Vancomycin, and Epinephrine   Past Medical History:  Diagnosis Date   Acquired absence of breast and nipple    Acute myocardial infarction, unspecified site, episode of care unspecified    1965 and 2015    ALLERGIC RHINITIS    Anginal pain (HCC)    Arthritis    Asthma    Blindness of left eye    decreased vision in left eye related to ocular occlusion    Breast cancer ([place])    left mastectomy   Complication of anesthesia    Coronary atherosclerosis    Cough    Dysfunction of eustachian tube    Esophageal reflux    Glaucoma    Heart murmur    Hemorrhoids    Hyperlipidemia    Hypertension    Lymphedema    Pacemaker 2015   Dr Lovena Le   Peripheral vascular disease ([place])    Pneumonia    hx of walking pneumonia x 2    PONV (postoperative nausea and vomiting)    Presence of permanent cardiac pacemaker    PVC's (premature ventricular contractions)    Shortness of breath    Stress incontinence    Syncope 11/17/2013    Past Surgical History:   Procedure Laterality Date   ABDOMINAL HYSTERECTOMY  1967   CARDIAC CATHETERIZATION  03/01/1986   normal coronaries (Dr. Domenic Moras)   CARDIAC CATHETERIZATION  10/25/2002   normal L main; LAD w/40% narrowing in prox 3rd and 60-70% narrowing beyond 1st diagonal, LAD was tortuous; dominant RCA with 30-40% segmental narrowing and 20-30% narrowing at junction of prox 3rd (Dr. Marella Chimes)   CARDIAC CATHETERIZATION  04/11/2006   trivial luminal irregularities in coronaries and mid LAD 50% (Dr. Domenic Moras)   CARDIOPULMONARY MET TEST  05/05/2012   excellent effort w/RER 1.06, peak VO2>100%, peak HR 81%, good functional capacity   CAROTID DOPPLER  2005   normal study (ordered for swelling & pain, left neck clavicle to ear)   DILATION AND CURETTAGE OF UTERUS  1962-1968   x4   GALLBLADDER SURGERY  1985   MASTECTOMY Left 1981   MYOMECTOMY  1968   NM MYOCAR PERF WALL MOTION  2012   bruce myoview -no inducible ischemia, EF 71%, low risk scan   PERMANENT PACEMAKER INSERTION N/A 11/18/2013   Procedure: PERMANENT PACEMAKER INSERTION;  Surgeon: Evans Lance, MD;  Location[facility] C[facility] LAB;  Service: Cardiovascular;  Laterality: N/A;   PLACEMENT OF BREAST IMPLANTS  1993   [facility]   REMOVAL OF BILATERAL TISSUE EXPANDERS WITH PLACEMENT OF BILATERAL BREAST IMPLANTS     TOTAL HIP ARTHROPLASTY Right 08/02/2015   Procedure: RIGHT TOTAL HIP ARTHROPLASTY ANTERIOR APPROACH;  Surgeon: Paralee Cancel, MD;  Location: [facility] ORS;  Service: Orthopedics;  Laterality: Right;   TRANSTHORACIC ECHOCARDIOGRAM  2014   EF 25-85%, grade 1 diastolic dysfunction; mildly thickened MV leaflets, trivial regurg      Social History:  The patient  reports that she has never smoked. She has never used smokeless tobacco. She reports current alcohol use. She reports that she does not use drugs.   Family History:  The patient's family history includes CAD in her brother and brother; Cancer in her brother, maternal grandfather, mother, and sister;  Cervical cancer in her sister; Colon cancer in her sister; Colon cancer (age of onset: 62) in her mother; Heart attack in her brother; Heart disease in her maternal grandfather, maternal grandmother, mother, and paternal grandmother; Liver cancer in her brother; Lymphoma in her sister; Stroke in her brother, brother, maternal grandmother, mother, and paternal grandfather.    ROS:  Please see the history of present illness. All other systems are reviewed and  Negative to the above problem except as noted.    PHYSICAL EXAM: VS:  BP 130/78 (BP Location: Right Arm, Patient Position: Sitting, Cuff Size: Normal)   Pulse 64   Ht '5\' 3"'$  (1.6 m)   Wt 161 lb (73 kg)   SpO2 96%   BMI 28.52 kg/m     GEN: Well nourished, well developed, in no acute distress  HEENT: normal  Neck: no JVD, no carotid bruit Cardiac: RRR; no murmurs  Tr PLE edema  Respiratory:  clear to auscultation bilaterally,  GI: soft, nontender, nondistended, + BS  No hepatomegaly  MS: no deformity Moving all extremities   Skin: warm and dry, no rash Neuro:  Strength and sensation are intact Psych: euthymic mood, full affect   EKG:  EKG is not  ordered today.    Echo Dec 2023    1. Left ventricular ejection fraction, by estimation, is 60 to 65%. The  left ventricle has normal function. The left ventricle has no regional  wall motion abnormalities. Left ventricular diastolic parameters are  consistent with Grade I diastolic  dysfunction (impaired relaxation). The average left ventricular global  longitudinal strain is -20.7 %. The global longitudinal strain is normal.   2. Right ventricular systolic function is normal. The right ventricular  size is normal. Tricuspid regurgitation signal is inadequate for assessing  PA pressure.   3. Right atrial size was mildly dilated.   4. The mitral valve is normal in structure. Trivial mitral valve  regurgitation. No evidence of mitral stenosis.   5. The aortic valve is normal in  structure. Aortic valve regurgitation is  not visualized. No aortic stenosis is present.   6. Aortic dilatation noted. There is mild dilatation of the ascending  aorta, measuring 38 mm.   7. The inferior vena cava is normal in size with greater than 50%  respiratory variability, suggesting right atrial pressure of 3 mmHg   Carotid USN  04/2020  Right Carotid: The extracranial vessels were near-normal with only minimal wall thickening or plaque. Left Carotid: There was no evidence of thrombus, dissection, atherosclerotic plaque or stenosis in the cervical carotid system. Final [place]  [employer] [id number] 05/06/2020 Incidental finding of bilateral thyroid nodules, both are hyperechoic in echo texture. Right dominant nodule 2.4 x 1.5 x 1.4 cm with small calcification within. Left inferior pole nodule .9 x .8 x .7 cm. Dedicated thyroid ultrasound is recommended if clinically warranted. *See table(s) above for measurements and observations. Electronically signed by Kathlyn Sacramento MD on 05/07/2020 at 2:59:39 PM. Vertebrals: Bilateral vertebral arteries demonstrate antegrade flow. Subclavians: Normal flow hemodynamics were seen in bilateral subclavian arteries. Lipid Panel    Component Value Date/Time   CHOL 176 10/28/2014 0440   TRIG 126 10/28/2014 0440   HDL 42 10/28/2014 0440   CHOLHDL 4.2 10/28/2014 0440   VLDL 25 10/28/2014 0440   LDLCALC 109 (H) 10/28/2014 0440      Wt Readings from Last 3 Encounters:  05/01/22 161 lb (73 kg)  03/07/22 162 lb 3.2 oz (73.6 kg)  09/29/21 166 lb (75.3 kg)      ASSESSMENT AND PLAN:   1   Dyspnea   Pt continues to get winded with some activities   Denies CP    I discussed possible [employer]   She has had in the past and said it was very uncomfortable I am not convinced her symptoms represent an anginal equivalent     I would recomm she try to work out some to build endurance     Try to minimize stressors   2  HTN  BP is controlledfor age  Follow    2 Hx of CAD Mild/modCAD  As noted above follow for now      3  Hx PPM   F/U  in EP   4  Hx dizziness Denies dizziness  5  Lipids  Last lipids LDL91HDL 55  Need to address    6  CV dz   minimal   From a cardiac standpoint I think pt is at low risk for cardiac complication from colonscopy and OK to proceed    Current medicines are reviewed at length with the patient today.  The patient does not have concerns regarding medicines.  Signed, Dorris Carnes, MD  05/01/2022 11:54 AM    [address], [place], [place]  [address] Phone: [phone]; Fax: [phone]

## 2022-05-01 ENCOUNTER — Encounter: Payer: Self-pay | Admitting: Internal Medicine

## 2022-05-01 ENCOUNTER — Ambulatory Visit: Payer: PPO | Attending: Internal Medicine | Admitting: Internal Medicine

## 2022-05-01 VITALS — BP 130/78 | HR 64 | Ht 63.0 in | Wt 161.0 lb

## 2022-05-01 DIAGNOSIS — I2510 Atherosclerotic heart disease of native coronary artery without angina pectoris: Secondary | ICD-10-CM | POA: Diagnosis not present

## 2022-05-01 NOTE — Patient Instructions (Signed)
Medication Instructions:   *If you need a refill on your cardiac medications before your next appointment, please call your pharmacy*   Lab Work: NMR   If you have labs (blood work) drawn today and your tests are completely normal, you will receive your results only by: [place] (if you have MyChart) OR A paper copy in the mail If you have any lab test that is abnormal or we need to change your treatment, we will call you to review the results.   Testing/Procedures:    Follow-Up: At [facility], you and your health needs are our priority.  As part of our continuing mission to provide you with exceptional heart care, we have created designated Provider Care Teams.  These Care Teams include your primary Cardiologist (physician) and Advanced Practice Providers (APPs -  Physician Assistants and Nurse Practitioners) who all work together to provide you with the care you need, when you need it.  We recommend signing up for the patient portal called "MyChart".  Sign up information is provided on this After Visit Summary.  MyChart is used to connect with patients for Virtual Visits (Telemedicine).  Patients are able to view lab/test results, encounter notes, upcoming appointments, etc.  Non-urgent messages can be sent to your provider as well.   To learn more about what you can do with MyChart, go to [email or web address].    Your next appointment:   6 month(s)  Provider:   Dorris Carnes, MD     Other Instructions

## 2022-05-02 LAB — NMR, LIPOPROFILE
Cholesterol, Total: 156 mg/dL (ref 100–199)
HDL Particle Number: 35.9 umol/L (ref >=30.5)
HDL-C: 57 mg/dL (ref >39)
LDL Particle Number: 968 nmol/L (ref <1000)
LDL Size: 20.3 nm — ABNORMAL LOW (ref >20.5)
LDL-C (NIH Calc): 74 mg/dL (ref 0–99)
LP-IR Score: 44 (ref <=45)
Small LDL Particle Number: 569 nmol/L — ABNORMAL HIGH (ref <=527)
Triglycerides: 144 mg/dL (ref 0–149)

## 2022-05-03 ENCOUNTER — Other Ambulatory Visit: Payer: Self-pay

## 2022-05-21 DIAGNOSIS — H2512 Age-related nuclear cataract, left eye: Secondary | ICD-10-CM | POA: Diagnosis not present

## 2022-05-21 DIAGNOSIS — H401132 Primary open-angle glaucoma, bilateral, moderate stage: Secondary | ICD-10-CM | POA: Diagnosis not present

## 2022-05-21 DIAGNOSIS — H349 Unspecified retinal vascular occlusion: Secondary | ICD-10-CM | POA: Diagnosis not present

## 2022-05-22 DIAGNOSIS — I4891 Unspecified atrial fibrillation: Secondary | ICD-10-CM | POA: Diagnosis not present

## 2022-05-22 DIAGNOSIS — N183 Chronic kidney disease, stage 3 unspecified: Secondary | ICD-10-CM | POA: Diagnosis not present

## 2022-05-22 DIAGNOSIS — I495 Sick sinus syndrome: Secondary | ICD-10-CM | POA: Diagnosis not present

## 2022-05-22 DIAGNOSIS — E785 Hyperlipidemia, unspecified: Secondary | ICD-10-CM | POA: Diagnosis not present

## 2022-05-22 DIAGNOSIS — D6869 Other thrombophilia: Secondary | ICD-10-CM | POA: Diagnosis not present

## 2022-05-22 DIAGNOSIS — E663 Overweight: Secondary | ICD-10-CM | POA: Diagnosis not present

## 2022-05-22 DIAGNOSIS — E559 Vitamin D deficiency, unspecified: Secondary | ICD-10-CM | POA: Diagnosis not present

## 2022-05-22 DIAGNOSIS — E538 Deficiency of other specified B group vitamins: Secondary | ICD-10-CM | POA: Diagnosis not present

## 2022-05-22 DIAGNOSIS — I442 Atrioventricular block, complete: Secondary | ICD-10-CM | POA: Diagnosis not present

## 2022-05-22 DIAGNOSIS — F330 Major depressive disorder, recurrent, mild: Secondary | ICD-10-CM | POA: Diagnosis not present

## 2022-05-22 DIAGNOSIS — H409 Unspecified glaucoma: Secondary | ICD-10-CM | POA: Diagnosis not present

## 2022-05-22 DIAGNOSIS — F419 Anxiety disorder, unspecified: Secondary | ICD-10-CM | POA: Diagnosis not present

## 2022-05-31 ENCOUNTER — Ambulatory Visit: Payer: PPO | Admitting: Internal Medicine

## 2022-06-05 ENCOUNTER — Telehealth: Payer: Self-pay

## 2022-06-05 ENCOUNTER — Ambulatory Visit (INDEPENDENT_AMBULATORY_CARE_PROVIDER_SITE_OTHER): Payer: PPO

## 2022-06-05 DIAGNOSIS — I442 Atrioventricular block, complete: Secondary | ICD-10-CM

## 2022-06-05 NOTE — Telephone Encounter (Signed)
Return call to patient who wanted to cancel her appointment for 06/06/2022. She states she was just seen in February. Advised I would cancel appointment and let Dr Harrington Challenger know.

## 2022-06-05 NOTE — Telephone Encounter (Signed)
The patient wants to know if she needs to keep the appointment with Dr. Harrington Challenger? She states she saw her a month ago. She would like for someone to give her a call back.

## 2022-06-06 ENCOUNTER — Ambulatory Visit: Payer: PPO | Admitting: Internal Medicine

## 2022-06-07 DIAGNOSIS — R7301 Impaired fasting glucose: Secondary | ICD-10-CM | POA: Diagnosis not present

## 2022-06-07 DIAGNOSIS — J4521 Mild intermittent asthma with (acute) exacerbation: Secondary | ICD-10-CM | POA: Diagnosis not present

## 2022-06-07 DIAGNOSIS — F4322 Adjustment disorder with anxiety: Secondary | ICD-10-CM | POA: Diagnosis not present

## 2022-06-07 DIAGNOSIS — I2510 Atherosclerotic heart disease of native coronary artery without angina pectoris: Secondary | ICD-10-CM | POA: Diagnosis not present

## 2022-06-07 DIAGNOSIS — I10 Essential (primary) hypertension: Secondary | ICD-10-CM | POA: Diagnosis not present

## 2022-06-07 DIAGNOSIS — K589 Irritable bowel syndrome without diarrhea: Secondary | ICD-10-CM | POA: Diagnosis not present

## 2022-06-07 DIAGNOSIS — H348122 Central retinal vein occlusion, left eye, stable: Secondary | ICD-10-CM | POA: Diagnosis not present

## 2022-06-07 DIAGNOSIS — E785 Hyperlipidemia, unspecified: Secondary | ICD-10-CM | POA: Diagnosis not present

## 2022-06-07 DIAGNOSIS — Z950 Presence of cardiac pacemaker: Secondary | ICD-10-CM | POA: Diagnosis not present

## 2022-06-07 LAB — CUP PACEART REMOTE DEVICE CHECK
Battery Impedance: 1206 Ohm
Battery Remaining Longevity: 54 mo
Battery Voltage: 2.77 V
Brady Statistic AP VP Percent: 0 %
Brady Statistic AP VS Percent: 99 %
Brady Statistic AS VP Percent: 0 %
Brady Statistic AS VS Percent: 1 %
Date Time Interrogation Session: 20240312090512
Implantable Lead Connection Status: 753985
Implantable Lead Connection Status: 753985
Implantable Lead Implant Date: 20150826
Implantable Lead Implant Date: 20150826
Implantable Lead Location: 753859
Implantable Lead Location: 753860
Implantable Lead Model: 5076
Implantable Lead Model: 5076
Implantable Pulse Generator Implant Date: 20150826
Lead Channel Impedance Value: 464 Ohm
Lead Channel Impedance Value: 624 Ohm
Lead Channel Pacing Threshold Amplitude: 0.375 V
Lead Channel Pacing Threshold Amplitude: 0.5 V
Lead Channel Pacing Threshold Pulse Width: 0.4 ms
Lead Channel Pacing Threshold Pulse Width: 0.4 ms
Lead Channel Setting Pacing Amplitude: 2 V
Lead Channel Setting Pacing Amplitude: 2.5 V
Lead Channel Setting Pacing Pulse Width: 0.4 ms
Lead Channel Setting Sensing Sensitivity: 4 mV
Zone Setting Status: 755011
Zone Setting Status: 755011

## 2022-07-17 NOTE — Progress Notes (Signed)
Remote pacemaker transmission.   

## 2022-08-07 DIAGNOSIS — L570 Actinic keratosis: Secondary | ICD-10-CM | POA: Diagnosis not present

## 2022-08-07 DIAGNOSIS — L821 Other seborrheic keratosis: Secondary | ICD-10-CM | POA: Diagnosis not present

## 2022-08-07 DIAGNOSIS — L820 Inflamed seborrheic keratosis: Secondary | ICD-10-CM | POA: Diagnosis not present

## 2022-08-07 DIAGNOSIS — L718 Other rosacea: Secondary | ICD-10-CM | POA: Diagnosis not present

## 2022-08-07 DIAGNOSIS — L814 Other melanin hyperpigmentation: Secondary | ICD-10-CM | POA: Diagnosis not present

## 2022-08-07 DIAGNOSIS — I788 Other diseases of capillaries: Secondary | ICD-10-CM | POA: Diagnosis not present

## 2022-08-21 ENCOUNTER — Ambulatory Visit: Payer: PPO | Admitting: Podiatry

## 2022-08-21 DIAGNOSIS — B351 Tinea unguium: Secondary | ICD-10-CM | POA: Diagnosis not present

## 2022-08-21 DIAGNOSIS — M79674 Pain in right toe(s): Secondary | ICD-10-CM

## 2022-08-21 DIAGNOSIS — M79675 Pain in left toe(s): Secondary | ICD-10-CM

## 2022-08-21 MED ORDER — CICLOPIROX 8 % EX SOLN: Freq: Every day | CUTANEOUS | 0 refills | Status: DC

## 2022-08-21 NOTE — Progress Notes (Signed)
  Subjective:  Patient ID: Kathrine Cords, female    DOB: February 24, 1934,  MRN: [id number]  Chief Complaint  Patient presents with   Nail Problem    np right great toenail fungus/ hx of having toes stepped on by horses when young( req Dr Lilian Kapur)    89 y.o. female presents with the above complaint. History confirmed with patient.  She is unable to cut the nails due to the thickness and length and texture  Objective:  Physical Exam: warm, good capillary refill, no trophic changes or ulcerative lesions, normal DP and PT pulses, and normal sensory exam. Left Foot: dystrophic yellowed discolored nail plates with subungual debris Right Foot: dystrophic yellowed discolored nail plates with subungual debris   Assessment:   1. Pain due to onychomycosis of toenails of both feet      Plan:  Patient was evaluated and treated and all questions answered.  Discussed the etiology and treatment options for the condition in detail with the patient. Educated patient on the topical and oral treatment options for mycotic nails. Recommended debridement of the nails today. Sharp and mechanical debridement performed of all painful and mycotic nails today. Nails debrided in length and thickness using a nail nipper to level of comfort. Discussed treatment options including appropriate shoe gear. Follow up as needed for painful nails.  We also discussed laser treatment.  She would like to try topical therapy first before proceeding with laser treatment.  Rx for Penlac sent to pharmacy.    Return in about 3 months (around 11/21/2022) for follow up after nail fungus treatment.

## 2022-09-05 DIAGNOSIS — H401131 Primary open-angle glaucoma, bilateral, mild stage: Secondary | ICD-10-CM | POA: Diagnosis not present

## 2022-09-05 DIAGNOSIS — H2512 Age-related nuclear cataract, left eye: Secondary | ICD-10-CM | POA: Diagnosis not present

## 2022-09-05 DIAGNOSIS — H5310 Unspecified subjective visual disturbances: Secondary | ICD-10-CM | POA: Diagnosis not present

## 2022-09-05 DIAGNOSIS — H349 Unspecified retinal vascular occlusion: Secondary | ICD-10-CM | POA: Diagnosis not present

## 2022-10-04 ENCOUNTER — Other Ambulatory Visit: Payer: Self-pay | Admitting: Internal Medicine

## 2022-10-20 ENCOUNTER — Other Ambulatory Visit: Payer: Self-pay

## 2022-10-20 ENCOUNTER — Emergency Department (HOSPITAL_BASED_OUTPATIENT_CLINIC_OR_DEPARTMENT_OTHER)
Admission: EM | Admit: 2022-10-20 | Discharge: 2022-10-20 | Disposition: A | Discharge status: Home / Self Care | Payer: PPO | Attending: Emergency Medicine | Admitting: Emergency Medicine

## 2022-10-20 ENCOUNTER — Emergency Department (HOSPITAL_BASED_OUTPATIENT_CLINIC_OR_DEPARTMENT_OTHER): Payer: PPO

## 2022-10-20 DIAGNOSIS — D649 Anemia, unspecified: Secondary | ICD-10-CM | POA: Insufficient documentation

## 2022-10-20 DIAGNOSIS — I2510 Atherosclerotic heart disease of native coronary artery without angina pectoris: Secondary | ICD-10-CM | POA: Insufficient documentation

## 2022-10-20 DIAGNOSIS — R0602 Shortness of breath: Secondary | ICD-10-CM

## 2022-10-20 DIAGNOSIS — U071 COVID-19: Secondary | ICD-10-CM | POA: Diagnosis not present

## 2022-10-20 DIAGNOSIS — I10 Essential (primary) hypertension: Secondary | ICD-10-CM | POA: Insufficient documentation

## 2022-10-20 DIAGNOSIS — R509 Fever, unspecified: Secondary | ICD-10-CM | POA: Diagnosis not present

## 2022-10-20 DIAGNOSIS — Z7982 Long term (current) use of aspirin: Secondary | ICD-10-CM | POA: Diagnosis not present

## 2022-10-20 DIAGNOSIS — I517 Cardiomegaly: Secondary | ICD-10-CM | POA: Diagnosis not present

## 2022-10-20 DIAGNOSIS — Z91040 Latex allergy status: Secondary | ICD-10-CM | POA: Insufficient documentation

## 2022-10-20 DIAGNOSIS — Z79899 Other long term (current) drug therapy: Secondary | ICD-10-CM | POA: Diagnosis not present

## 2022-10-20 LAB — CBC WITH DIFFERENTIAL/PLATELET
Abs Immature Granulocytes: 0.01 10*3/uL (ref 0.00–0.07)
Basophils Absolute: 0 10*3/uL (ref 0.0–0.1)
Basophils Relative: 0 %
Eosinophils Absolute: 0 10*3/uL (ref 0.0–0.5)
Eosinophils Relative: 0 %
HCT: 35 % — ABNORMAL LOW (ref 36.0–46.0)
Hemoglobin: 11.7 g/dL — ABNORMAL LOW (ref 12.0–15.0)
Immature Granulocytes: 0 %
Lymphocytes Relative: 13 %
Lymphs Abs: 0.6 10*3/uL — ABNORMAL LOW (ref 0.7–4.0)
MCH: 29 pg (ref 26.0–34.0)
MCHC: 33.4 g/dL (ref 30.0–36.0)
MCV: 86.6 fL (ref 80.0–100.0)
Monocytes Absolute: 0.6 10*3/uL (ref 0.1–1.0)
Monocytes Relative: 12 %
Neutro Abs: 3.7 10*3/uL (ref 1.7–7.7)
Neutrophils Relative %: 75 %
Platelets: 136 10*3/uL — ABNORMAL LOW (ref 150–400)
RBC: 4.04 MIL/uL (ref 3.87–5.11)
RDW: 12.1 % (ref 11.5–15.5)
WBC: 5 10*3/uL (ref 4.0–10.5)
nRBC: 0 % (ref 0.0–0.2)

## 2022-10-20 LAB — BASIC METABOLIC PANEL
Anion gap: 7 (ref 5–15)
BUN: 18 mg/dL (ref 8–23)
CO2: 23 mmol/L (ref 22–32)
Calcium: 8.7 mg/dL — ABNORMAL LOW (ref 8.9–10.3)
Chloride: 103 mmol/L (ref 98–111)
Creatinine, Ser: 1.06 mg/dL — ABNORMAL HIGH (ref 0.44–1.00)
GFR, Estimated: 51 mL/min — ABNORMAL LOW (ref >60)
Glucose, Bld: 100 mg/dL — ABNORMAL HIGH (ref 70–99)
Potassium: 4.3 mmol/L (ref 3.5–5.1)
Sodium: 133 mmol/L — ABNORMAL LOW (ref 135–145)

## 2022-10-20 LAB — BRAIN NATRIURETIC PEPTIDE: B Natriuretic Peptide: 248.2 pg/mL — ABNORMAL HIGH (ref 0.0–100.0)

## 2022-10-20 LAB — MAGNESIUM: Magnesium: 2 mg/dL (ref 1.7–2.4)

## 2022-10-20 LAB — TROPONIN I (HIGH SENSITIVITY): Troponin I (High Sensitivity): 4 ng/L (ref <18)

## 2022-10-20 NOTE — ED Notes (Signed)
RT Note: Patient oxygen saturation on room air while @ rest 96% with a HR 71 HR 16 Patient oxygen saturation on room air while ambulating 94% with a HR 79 RR 18

## 2022-10-20 NOTE — Discharge Instructions (Addendum)
Continue to take your amoxicillin, recommend you continue to manage your symptoms at home, Tylenol for pain control and fever control, return for inability to tolerate oral intake, worsening severe symptoms to include worsening shortness of breath.

## 2022-10-20 NOTE — ED Triage Notes (Signed)
Feeling flu symptoms several days. Tested COVID+ yesterday. 102 fever at home- taking tylenol. Reports SOB and fatigue. HX MI, asthma, breast cancer-left mastect.

## 2022-10-20 NOTE — ED Provider Notes (Addendum)
[facility] EMERGENCY DEPARTMENT AT [facility] Provider Note   CSN: [id number] Arrival date & time: 10/20/22  1508     History  Chief Complaint  Patient presents with   Shortness of Breath   COVID    Madison [name] is a 89 y.o. female.   Shortness of Breath Associated symptoms: chest pain and fever       89 year old female with medical hx significant for CAD, HTN, PVCs, GERD, sinus node dysfunction s/p PPM in 2015 who presents to the ED with a complaint of fatigue and SOB in the setting of recently testing positive for COVID 19.  The patient states that she developed symptoms about 1 week ago which she thought were sinusitis.  She endorses sinus congestion and mild fatigue.  Over the last day she has developed a fever to 102.  Has been taking Tylenol at home.  She endorses worsening shortness of breath and a dry cough.  Over the last few days she has had chest pressure described as radiating across to her chest and a tightness sensation.  No radiation to the back or down the arm.  No significant productive cough.  Her PCP started her on amoxicillin for a presumed sinus infection and she has been on the medication for the past 3 days.  She tested positive for COVID yesterday.  Home Medications Prior to Admission medications   Medication Sig Start Date End Date Taking? Authorizing Provider  albuterol (VENTOLIN HFA) 108 (90 Base) MCG/ACT inhaler Inhale 1 puff into the lungs as needed.    [provider]  amoxicillin (AMOXIL) 500 MG capsule Take 500 mg by mouth as needed. 11/09/21   [provider]  Aspirin 81 MG CAPS Take 81 mg by mouth as directed.    [provider]  atenolol (TENORMIN) 25 MG tablet Take 25 mg by mouth twice a day morning and night with 1/2 tablet (12.5) mg in the afternoon. 04/10/22   Pricilla Riffle, MD  atorvastatin (LIPITOR) 10 MG tablet Take 10 mg by mouth 4 (four) times a week.    [provider]  ciclopirox (PENLAC) 8  % solution Apply topically at bedtime. Apply over nail and surrounding skin. Apply daily over previous coat. After seven (7) days, may remove with alcohol and continue cycle. 08/21/22   McDonald, Rachelle Hora, DPM  clonazePAM (KLONOPIN) 0.5 MG disintegrating tablet TAKE ONE TABLET UNDER THE TONGUE AND allow TO dissolve TWICE DAILY AS NEEDED    [provider]  dorzolamide (TRUSOPT) 2 % ophthalmic solution SMARTSIG:In Eye(s) 04/16/20   [provider]  ezetimibe (ZETIA) 10 MG tablet TAKE 1 TABLET BY MOUTH ONCE DAILY 10/04/22   Pricilla Riffle, MD  fluticasone [facility]) 50 MCG/ACT nasal spray Place 1 spray into both nostrils daily as needed for allergies or rhinitis.    [provider]  Multiple Vitamin (MULTIVITAMIN) tablet Take 1 tablet by mouth daily.    [provider]  nitroGLYCERIN (NITROSTAT) 0.4 MG SL tablet Place 1 tablet (0.4 mg total) under the tongue every 5 (five) minutes as needed for chest pain. 11/28/18   Ronney Asters, NP  polyethylene glycol (MIRALAX / GLYCOLAX) 17 g packet Take 17 g by mouth daily.    [provider]  sertraline (ZOLOFT) 50 MG tablet Take 50 mg by mouth daily. 08/10/21   [provider]      Allergies    Tape, Codeine, Latex, Other, Oxycodone-acetaminophen, Penicillins, Percodan [oxycodone-aspirin], Pravastatin, Repatha [  evolocumab], Vancomycin, and Epinephrine    Review of Systems   Review of Systems  Constitutional:  Positive for fatigue and fever.  Respiratory:  Positive for shortness of breath.   Cardiovascular:  Positive for chest pain.  All other systems reviewed and are negative.   Physical Exam Updated Vital Signs BP (!) 110/52   Pulse 60   Temp 99.9 F (37.7 C) (Oral)   Resp 17   Ht 5\' 3"  (1.6 m)   Wt 69.9 kg   SpO2 97%   BMI 27.28 kg/m  Physical Exam Vitals and nursing note reviewed.  Constitutional:      General: She is not in acute distress.    Appearance: She is well-developed.  HENT:      Head: Normocephalic and atraumatic.  Eyes:     Conjunctiva/sclera: Conjunctivae normal.  Cardiovascular:     Rate and Rhythm: Normal rate and regular rhythm.     Heart sounds: No murmur heard. Pulmonary:     Effort: Pulmonary effort is normal. No respiratory distress.     Breath sounds: Normal breath sounds.  Abdominal:     Palpations: Abdomen is soft.     Tenderness: There is no abdominal tenderness.  Musculoskeletal:        General: No swelling.     Cervical back: Neck supple.  Skin:    General: Skin is warm and dry.     Capillary Refill: Capillary refill takes less than 2 seconds.  Neurological:     Mental Status: She is alert.  Psychiatric:        Mood and Affect: Mood normal.     ED Results / Procedures / Treatments   Labs (all labs ordered are listed, but only abnormal results are displayed) Labs Reviewed  CBC WITH DIFFERENTIAL/PLATELET - Abnormal; Notable for the following components:      Result Value   Hemoglobin 11.7 (*)    HCT 35.0 (*)    Platelets 136 (*)    Lymphs Abs 0.6 (*)    All other components within normal limits  BASIC METABOLIC PANEL - Abnormal; Notable for the following components:   Sodium 133 (*)    Glucose, Bld 100 (*)    Creatinine, Ser 1.06 (*)    Calcium 8.7 (*)    GFR, Estimated 51 (*)    All other components within normal limits  BRAIN NATRIURETIC PEPTIDE - Abnormal; Notable for the following components:   B Natriuretic Peptide 248.2 (*)    All other components within normal limits  MAGNESIUM  TROPONIN I (HIGH SENSITIVITY)    EKG EKG Interpretation Date/Time:  Saturday October 20 2022 15:29:16 EDT Ventricular Rate:  60 PR Interval:  301 QRS Duration:  91 QT Interval:  395 QTC Calculation: 395 R Axis:   232  Text Interpretation: Atrial-paced rhythm Right axis deviation Probable lateral infarct, age indeterminate Confirmed by Ernie Avena ([id number]) on 10/20/2022 3:31:02 PM  Radiology DG Chest Portable 1 View  Result Date:  10/20/2022 CLINICAL DATA:  Shortness of breath.  Fever. EXAM: PORTABLE CHEST 1 VIEW COMPARISON:  One-view chest x-ray 09/15/2021 FINDINGS: Heart is mildly enlarged. Pacing wires are stable. Mild interstitial prominence is present. No focal airspace disease present. Central airway thickening is present. Surgical clips are present in the left axilla. IMPRESSION: 1. Cardiomegaly without failure. 2. Central airway thickening is present without focal airspace disease. Electronically Signed   By: Marin Roberts M.D.   On: 10/20/2022 15:51    Procedures Procedures  Medications Ordered in ED Medications - No data to display  ED Course/ Medical Decision Making/ A&P                             Medical Decision Making Amount and/or Complexity of Data Reviewed Labs: ordered. Radiology: ordered.    89 year old female with medical hx significant for CAD, HTN, PVCs, GERD, sinus node dysfunction s/p PPM in 2015 who presents to the ED with a complaint of fatigue and SOB in the setting of recently testing positive for COVID 19.  The patient states that she developed symptoms about 1 week ago which she thought were sinusitis.  She endorses sinus congestion and mild fatigue.  Over the last day she has developed a fever to 102.  Has been taking Tylenol at home.  She endorses worsening shortness of breath and a dry cough.  Over the last few days she has had chest pressure described as radiating across to her chest and a tightness sensation.  No radiation to the back or down the arm.  No significant productive cough.  Her PCP started her on amoxicillin for a presumed sinus infection and she has been on the medication for the past 3 days.  She tested positive for COVID yesterday.  On arrival, the patient was afebrile, temperature 99.9, not tachycardic or tachypneic, paced rhythm noted on telemetry and EKG, BP 105/56, saturating 98% on room air.  Concern for worsening symptoms of COVID-19, developing  superimposed bacterial pneumonia.  Lower concern for ACS, PE.   EKG: Atrial paced rhythm, ventricular rate 60, no acute ischemic changes noted.  Chest x-ray:  IMPRESSION:  1. Cardiomegaly without failure.  2. Central airway thickening is present without focal airspace  disease.    Labs: Troponin 4, magnesium 2, BMP with mild hyponatremia 133, no AKI, no other significant electrolyte abnormality, CBC without a leukocytosis, mild anemia noted 11.7. BNP nonspecifically moderately elevated to 249.  Ambulatory pulse ox was performed, without desaturations noted. With 7 days of sx, pt is outside the treatment window for antivirals for Covid.  Pt without evidence of CHF exacerbation, troponin normal, BNP only mild/moderately elevated. With xx of chest pressure for past few days, low concern for ACS with a normal troponin. No sharp pleuritic CP, low concern for PE. Saturating well, not tachycardic or tachypneic, appears euvolemic. Tolerating oral intake and well appearing with a reassuring workup, overal stable for DC and continued outpatient management.   Final Clinical Impression(s) / ED Diagnoses Final diagnoses:  COVID-19  SOB (shortness of breath)    Rx / DC Orders ED Discharge Orders     None         Ernie Avena, MD 10/20/22 1718    Ernie Avena, MD 10/20/22 1723

## 2022-10-24 DIAGNOSIS — Z1152 Encounter for screening for COVID-19: Secondary | ICD-10-CM | POA: Diagnosis not present

## 2022-10-24 DIAGNOSIS — J4521 Mild intermittent asthma with (acute) exacerbation: Secondary | ICD-10-CM | POA: Diagnosis not present

## 2022-10-24 DIAGNOSIS — U071 COVID-19: Secondary | ICD-10-CM | POA: Diagnosis not present

## 2022-10-24 DIAGNOSIS — R058 Other specified cough: Secondary | ICD-10-CM | POA: Diagnosis not present

## 2022-10-29 ENCOUNTER — Telehealth: Payer: Self-pay

## 2022-10-29 NOTE — Telephone Encounter (Signed)
Transition Care Management Follow-up Telephone Call Date of discharge and from where: [facility] 7/27 How have you been since you were released from the hospital? Not doing well as she was hoping for Any questions or concerns? No  Items Reviewed: Did the pt receive and understand the discharge instructions provided? Yes  Medications obtained and verified? Yes  Other? No  Any new allergies since your discharge? No  Dietary orders reviewed? No Do you have support at home? Yes     Follow up appointments reviewed:  PCP Hospital f/u appt confirmed? Yes  Scheduled to see  on  @ . Specialist Hospital f/u appt confirmed? No  Scheduled to see  on  @ . Are transportation arrangements needed? No  If their condition worsens, is the pt aware to call PCP or go to the Emergency Dept.? Yes Was the patient provided with contact information for the PCP's office or ED? Yes Was to pt encouraged to call back with questions or concerns? Yes

## 2022-11-04 NOTE — Progress Notes (Deleted)
Cardiology Office Note   Date:  11/04/2022   ID:  Madison, [name] February 23, 1934, MRN [id number]  PCP:  Rodrigo Ran, MD  Cardiologist:   Dietrich Pates, MD   F/U of HTN and CAD    History of Present Illness: Madison [name] is a 89 y.o. female with a history of mild to mod CAD  Myovue in 2015 normal Echo showed normal LVEF Cardopulmonary stress test consistent with deconditioning.   The pt also has hx of HTN, PVCs, GERD, HL  And she is  s/p PPM in 2015 for SN dysfunction    Echo in Feb 2022 showed  LVEF / RVEF normal     I saw the pt in clinic in Dec 2023    At that visit she complained of dyspnea with activity   She was under increased stress    Echo done   Normal LVEF and RVEF  Mld diastolic dysfunction  Since seen she still gets winded if she walks fast   She deneis CP     No dizziness  I saw the pt in Feb 2024  Allergies:   Tape, Codeine, Latex, Other, Oxycodone-acetaminophen, Penicillins, Percodan [oxycodone-aspirin], Pravastatin, Repatha [evolocumab], Vancomycin, and Epinephrine   Past Medical History:  Diagnosis Date   Acquired absence of breast and nipple    Acute myocardial infarction, unspecified site, episode of care unspecified    1965 and 2015    ALLERGIC RHINITIS    Anginal pain (HCC)    Arthritis    Asthma    Blindness of left eye    decreased vision in left eye related to ocular occlusion    Breast cancer (HCC)    left mastectomy   Complication of anesthesia    Coronary atherosclerosis    Cough    Dysfunction of eustachian tube    Esophageal reflux    Glaucoma    Heart murmur    Hemorrhoids    Hyperlipidemia    Hypertension    Lymphedema    Pacemaker 2015   Dr Ladona Ridgel   Peripheral vascular disease (HCC)    Pneumonia    hx of walking pneumonia x 2    PONV (postoperative nausea and vomiting)    Presence of permanent cardiac pacemaker    PVC's (premature ventricular contractions)    Shortness of breath    Stress incontinence    Syncope 11/17/2013     Past Surgical History:  Procedure Laterality Date   ABDOMINAL HYSTERECTOMY  1967   CARDIAC CATHETERIZATION  03/01/1986   normal coronaries (Dr. Aram Candela)   CARDIAC CATHETERIZATION  10/25/2002   normal L main; LAD w/40% narrowing in prox 3rd and 60-70% narrowing beyond 1st diagonal, LAD was tortuous; dominant RCA with 30-40% segmental narrowing and 20-30% narrowing at junction of prox 3rd (Dr. Jonette Eva)   CARDIAC CATHETERIZATION  04/11/2006   trivial luminal irregularities in coronaries and mid LAD 50% (Dr. Aram Candela)   CARDIOPULMONARY MET TEST  05/05/2012   excellent effort w/RER 1.06, peak VO2>100%, peak HR 81%, good functional capacity   CAROTID DOPPLER  2005   normal study (ordered for swelling & pain, left neck clavicle to ear)   DILATION AND CURETTAGE OF UTERUS  1962-1968   x4   GALLBLADDER SURGERY  1985   MASTECTOMY Left 1981   MYOMECTOMY  1968   NM MYOCAR PERF WALL MOTION  2012   bruce myoview -no inducible ischemia, EF 71%, low risk scan   PERMANENT PACEMAKER INSERTION N/A  11/18/2013   Procedure: PERMANENT PACEMAKER INSERTION;  Surgeon: Marinus Maw, MD;  Location: [facility] CATH LAB;  Service: Cardiovascular;  Laterality: N/A;   PLACEMENT OF BREAST IMPLANTS  1993   [facility]   REMOVAL OF BILATERAL TISSUE EXPANDERS WITH PLACEMENT OF BILATERAL BREAST IMPLANTS     TOTAL HIP ARTHROPLASTY Right 08/02/2015   Procedure: RIGHT TOTAL HIP ARTHROPLASTY ANTERIOR APPROACH;  Surgeon: Durene Romans, MD;  Location: [facility] ORS;  Service: Orthopedics;  Laterality: Right;   TRANSTHORACIC ECHOCARDIOGRAM  2014   EF 55-60%, grade 1 diastolic dysfunction; mildly thickened MV leaflets, trivial regurg      Social History:  The patient  reports that she has never smoked. She has never used smokeless tobacco. She reports current alcohol use. She reports that she does not use drugs.   Family History:  The patient's family history includes CAD in her brother and brother; Cancer in her brother, maternal  grandfather, mother, and sister; Cervical cancer in her sister; Colon cancer in her sister; Colon cancer (age of onset: 32) in her mother; Heart attack in her brother; Heart disease in her maternal grandfather, maternal grandmother, mother, and paternal grandmother; Liver cancer in her brother; Lymphoma in her sister; Stroke in her brother, brother, maternal grandmother, mother, and paternal grandfather.    ROS:  Please see the history of present illness. All other systems are reviewed and  Negative to the above problem except as noted.    PHYSICAL EXAM: VS:  There were no vitals taken for this visit.    GEN: Well nourished, well developed, in no acute distress  HEENT: normal  Neck: no JVD, no carotid bruit Cardiac: RRR; no murmurs  Tr PLE edema  Respiratory:  clear to auscultation bilaterally,  GI: soft, nontender, nondistended, + BS  No hepatomegaly  MS: no deformity Moving all extremities   Skin: warm and dry, no rash Neuro:  Strength and sensation are intact Psych: euthymic mood, full affect   EKG:  EKG is not  ordered today.    Echo Dec 2023    1. Left ventricular ejection fraction, by estimation, is 60 to 65%. The  left ventricle has normal function. The left ventricle has no regional  wall motion abnormalities. Left ventricular diastolic parameters are  consistent with Grade I diastolic  dysfunction (impaired relaxation). The average left ventricular global  longitudinal strain is -20.7 %. The global longitudinal strain is normal.   2. Right ventricular systolic function is normal. The right ventricular  size is normal. Tricuspid regurgitation signal is inadequate for assessing  PA pressure.   3. Right atrial size was mildly dilated.   4. The mitral valve is normal in structure. Trivial mitral valve  regurgitation. No evidence of mitral stenosis.   5. The aortic valve is normal in structure. Aortic valve regurgitation is  not visualized. No aortic stenosis is present.    6. Aortic dilatation noted. There is mild dilatation of the ascending  aorta, measuring 38 mm.   7. The inferior vena cava is normal in size with greater than 50%  respiratory variability, suggesting right atrial pressure of 3 mmHg   Carotid USN  04/2020  Right Carotid: The extracranial vessels were near-normal with only minimal wall thickening or plaque. Left Carotid: There was no evidence of thrombus, dissection, atherosclerotic plaque or stenosis in the cervical carotid system. Final Madison [name] [id number] 05/06/2020 Incidental finding of bilateral thyroid nodules, both are hyperechoic in echo texture. Right dominant nodule 2.4 x 1.5 x 1.4  cm with small calcification within. Left inferior pole nodule .9 x .8 x .7 cm. Dedicated thyroid ultrasound is recommended if clinically warranted. *See table(s) above for measurements and observations. Electronically signed by Lorine Bears MD on 05/07/2020 at 2:59:39 PM. Vertebrals: Bilateral vertebral arteries demonstrate antegrade flow. Subclavians: Normal flow hemodynamics were seen in bilateral subclavian arteries. Lipid Panel    Component Value Date/Time   CHOL 176 10/28/2014 0440   TRIG 126 10/28/2014 0440   HDL 42 10/28/2014 0440   CHOLHDL 4.2 10/28/2014 0440   VLDL 25 10/28/2014 0440   LDLCALC 109 (H) 10/28/2014 0440      Wt Readings from Last 3 Encounters:  10/20/22 154 lb (69.9 kg)  05/01/22 161 lb (73 kg)  03/07/22 162 lb 3.2 oz (73.6 kg)      ASSESSMENT AND PLAN:   1   Dyspnea   Pt continues to get winded with some activities   Denies CP    I discussed possible [employer]   She has had in the past and said it was very uncomfortable I am not convinced her symptoms represent an anginal equivalent     I would recomm she try to work out some to build endurance     Try to minimize stressors   2  HTN  BP is controlledfor age Follow    2 Hx of CAD Mild/modCAD  As noted above follow for now      3  Hx PPM   F/U   in EP   4  Hx dizziness Denies dizziness  5  Lipids  Last lipids LDL91HDL 55  Need to address    6  CV dz   minimal   From a cardiac standpoint I think pt is at low risk for cardiac complication from colonscopy and OK to proceed    Current medicines are reviewed at length with the patient today.  The patient does not have concerns regarding medicines.  Signed, Dietrich Pates, MD  11/04/2022 9:22 PM    [facility] [address] [place], [place], [place]  [address] Phone: [phone]; Fax: [phone]

## 2022-11-06 ENCOUNTER — Telehealth: Payer: Self-pay | Admitting: Internal Medicine

## 2022-11-06 NOTE — Telephone Encounter (Signed)
Patient states that she recently had a chest xray done and would like to discuss this with Dr. Tenny Craw. She had to cancel her appt with Dr. Tenny Craw for 08/14 due to having covid 3 weeks ago. Requesting call back.

## 2022-11-06 NOTE — Telephone Encounter (Signed)
Called patient to clarify what her needs are regarding her recent chest xray. Patient states she was hospitalized for covid 10/20/22 and labs/CXR were done at that time, available in Mychart. She states she also had a chest xray in the last week at her PCP, She is requesting Dr. Tenny Craw review.  She also had to cancel her appt for tomorrow as she still has active cough and feels poorly from Covid. She is requesting to reschedule her appt. Forwarded to Dr. Tenny Craw and nurse.

## 2022-11-07 ENCOUNTER — Ambulatory Visit: Payer: PPO | Admitting: Internal Medicine

## 2022-11-07 NOTE — Telephone Encounter (Signed)
I spoke with the pt and she declined an appt with Dr Tenny Craw for nest week due to being so sick with COVID and needing time for recovery but she is improving.   I will forward to Dr Tenny Craw for her review of all the testing the pt had done in the ED and see when she would like to see the pt back in the office.

## 2022-11-08 NOTE — Telephone Encounter (Signed)
When she comes in to clinic again I would recomm: CBC, CMET, BNP, lipid panel

## 2022-11-13 ENCOUNTER — Ambulatory Visit: Payer: PPO | Admitting: Internal Medicine

## 2022-11-19 DIAGNOSIS — H401131 Primary open-angle glaucoma, bilateral, mild stage: Secondary | ICD-10-CM | POA: Diagnosis not present

## 2022-11-20 ENCOUNTER — Encounter: Payer: Self-pay | Admitting: Podiatry

## 2022-11-20 ENCOUNTER — Ambulatory Visit: Payer: PPO | Admitting: Podiatry

## 2022-11-20 DIAGNOSIS — B351 Tinea unguium: Secondary | ICD-10-CM | POA: Diagnosis not present

## 2022-11-20 DIAGNOSIS — M79674 Pain in right toe(s): Secondary | ICD-10-CM

## 2022-11-20 DIAGNOSIS — M79675 Pain in left toe(s): Secondary | ICD-10-CM | POA: Diagnosis not present

## 2022-11-20 NOTE — Progress Notes (Signed)
  Subjective:  Patient ID: Kathrine Cords, female    DOB: 01-09-1934,  MRN: [id number]  Chief Complaint  Patient presents with   Nail Problem    Pt  present for a nail fungus treatment follow up.    89 y.o. female presents with the above complaint. History confirmed with patient.  Still using the Penlac.  She believes is helping.  Last debridement was helpful.  Objective:  Physical Exam: warm, good capillary refill, no trophic changes or ulcerative lesions, normal DP and PT pulses, and normal sensory exam. Left Foot: dystrophic yellowed discolored nail plates with subungual debris Right Foot: dystrophic yellowed discolored nail plates with subungual debris   Assessment:   1. Pain due to onychomycosis of toenails of both feet      Plan:  Patient was evaluated and treated and all questions answered.  Discussed the etiology and treatment options for the condition in detail with the patient. Educated patient on the topical and oral treatment options for mycotic nails. Recommended debridement of the nails today. Sharp and mechanical debridement performed of all painful and mycotic nails today. Nails debrided in length and thickness using a nail nipper to level of comfort. Discussed treatment options including appropriate shoe gear. Follow up as needed for painful nails.  Continue treatment with Penlac  Return in about 3 months (around 02/20/2023) for at risk diabetic foot care.

## 2022-11-27 ENCOUNTER — Other Ambulatory Visit: Payer: Self-pay | Admitting: Podiatry

## 2022-11-30 DIAGNOSIS — H1132 Conjunctival hemorrhage, left eye: Secondary | ICD-10-CM | POA: Diagnosis not present

## 2022-12-11 ENCOUNTER — Other Ambulatory Visit: Payer: Self-pay | Admitting: Gastroenterology

## 2022-12-11 DIAGNOSIS — Z86010 Personal history of colonic polyps: Secondary | ICD-10-CM | POA: Diagnosis not present

## 2022-12-11 DIAGNOSIS — Z800 Family history of malignant neoplasm of digestive organs: Secondary | ICD-10-CM | POA: Diagnosis not present

## 2022-12-11 DIAGNOSIS — K5730 Diverticulosis of large intestine without perforation or abscess without bleeding: Secondary | ICD-10-CM | POA: Diagnosis not present

## 2022-12-11 DIAGNOSIS — Z1211 Encounter for screening for malignant neoplasm of colon: Secondary | ICD-10-CM | POA: Diagnosis not present

## 2022-12-11 DIAGNOSIS — K5900 Constipation, unspecified: Secondary | ICD-10-CM | POA: Diagnosis not present

## 2022-12-12 ENCOUNTER — Ambulatory Visit: Payer: PPO

## 2022-12-12 DIAGNOSIS — I442 Atrioventricular block, complete: Secondary | ICD-10-CM

## 2022-12-13 ENCOUNTER — Telehealth: Payer: Self-pay

## 2022-12-13 LAB — CUP PACEART REMOTE DEVICE CHECK
Battery Impedance: 1316 Ohm
Battery Remaining Longevity: 51 mo
Battery Voltage: 2.77 V
Brady Statistic AP VP Percent: 0 %
Brady Statistic AP VS Percent: 99 %
Brady Statistic AS VP Percent: 0 %
Brady Statistic AS VS Percent: 0 %
Date Time Interrogation Session: 20240918155125
Implantable Lead Connection Status: 753985
Implantable Lead Connection Status: 753985
Implantable Lead Implant Date: 20150826
Implantable Lead Implant Date: 20150826
Implantable Lead Location: 753859
Implantable Lead Location: 753860
Implantable Lead Model: 5076
Implantable Lead Model: 5076
Implantable Pulse Generator Implant Date: 20150826
Lead Channel Impedance Value: 440 Ohm
Lead Channel Impedance Value: 620 Ohm
Lead Channel Pacing Threshold Amplitude: 0.5 V
Lead Channel Pacing Threshold Amplitude: 0.5 V
Lead Channel Pacing Threshold Pulse Width: 0.4 ms
Lead Channel Pacing Threshold Pulse Width: 0.4 ms
Lead Channel Setting Pacing Amplitude: 2 V
Lead Channel Setting Pacing Amplitude: 2.5 V
Lead Channel Setting Pacing Pulse Width: 0.4 ms
Lead Channel Setting Sensing Sensitivity: 4 mV
Zone Setting Status: 755011
Zone Setting Status: 755011

## 2022-12-13 NOTE — Telephone Encounter (Signed)
The patient called to let me know that she sent her transmission with her home remote monitor. I let her know we did get it yesterday.   She states she needed an appointment with Dr. Tenny Craw and wanted to speak with her nurse. She had covid when someone called to schedule her appointment.   I transferred her to the scheduler to get scheduled.

## 2022-12-13 NOTE — Telephone Encounter (Signed)
Left a message for the pt to call back.

## 2022-12-13 NOTE — Telephone Encounter (Signed)
Patient is scheduled with Dr. Tenny Craw and added to the wait list. Patient stated she would like to speak with Dr. Charlott Rakes nurse about not feeling well. Please advise.

## 2022-12-14 NOTE — Telephone Encounter (Signed)
Left a message for the pt to call back.

## 2022-12-24 NOTE — Telephone Encounter (Signed)
Left another message for the pt to call back. Will close this encounter.

## 2022-12-27 NOTE — Progress Notes (Signed)
Remote pacemaker transmission.   

## 2023-01-15 DIAGNOSIS — E041 Nontoxic single thyroid nodule: Secondary | ICD-10-CM | POA: Diagnosis not present

## 2023-01-15 DIAGNOSIS — I10 Essential (primary) hypertension: Secondary | ICD-10-CM | POA: Diagnosis not present

## 2023-01-15 DIAGNOSIS — M8580 Other specified disorders of bone density and structure, unspecified site: Secondary | ICD-10-CM | POA: Diagnosis not present

## 2023-01-15 DIAGNOSIS — R7301 Impaired fasting glucose: Secondary | ICD-10-CM | POA: Diagnosis not present

## 2023-01-21 NOTE — Progress Notes (Unsigned)
Cardiology Office Note   Date:  01/22/2023   ID:  Madison [name], Madison [name] Jul 07, 1933, MRN [id number]  PCP:  Rodrigo Ran, MD  Cardiologist:   Dietrich Pates, MD   F/U of HTN and CAD    History of Present Illness: Madison [name] is a 89 y.o. female with a history of mild to mod CAD  Myovue in 2015 was normal Echo showed normal LVEF Cardopulmonary stress test consistent with deconditioning.   The pt also has hx of HTN, PVCs,  sinus node dysfunction (s/p PPM in 2015), GERD, HL    I saw the pt in clinic in Dec 2023    At that visit she complained of dyspnea with activity   She was under increased stress    Last echo done in Dec 2023 showed  Normal LVEF and RVEF  Mld diastolic dysfunction  I saw the pt in Feb 2024  She complained of some windedness with walking fast  Denied CP   Since seen she has overall done well  SHe denies CP  Breathing is OK    Had a bad episode of COVID this summer but has recovered    Allergies:   Tape, Codeine, Latex, Other, Oxycodone-acetaminophen, Penicillins, Percodan [oxycodone-aspirin], Pravastatin, Repatha [evolocumab], Vancomycin, and Epinephrine   Past Medical History:  Diagnosis Date   Acquired absence of breast and nipple    Acute myocardial infarction, unspecified site, episode of care unspecified    1965 and 2015    ALLERGIC RHINITIS    Anginal pain (HCC)    Arthritis    Asthma    Blindness of left eye    decreased vision in left eye related to ocular occlusion    Breast cancer (HCC)    left mastectomy   Complication of anesthesia    Coronary atherosclerosis    Cough    Dysfunction of eustachian tube    Esophageal reflux    Glaucoma    Heart murmur    Hemorrhoids    Hyperlipidemia    Hypertension    Lymphedema    Pacemaker 2015   Dr Ladona Ridgel   Peripheral vascular disease (HCC)    Pneumonia    hx of walking pneumonia x 2    PONV (postoperative nausea and vomiting)    Presence of permanent cardiac pacemaker    PVC's (premature ventricular  contractions)    Shortness of breath    Stress incontinence    Syncope 11/17/2013    Past Surgical History:  Procedure Laterality Date   ABDOMINAL HYSTERECTOMY  1967   CARDIAC CATHETERIZATION  03/01/1986   normal coronaries (Dr. Aram Candela)   CARDIAC CATHETERIZATION  10/25/2002   normal L main; LAD w/40% narrowing in prox 3rd and 60-70% narrowing beyond 1st diagonal, LAD was tortuous; dominant RCA with 30-40% segmental narrowing and 20-30% narrowing at junction of prox 3rd (Dr. Jonette Eva)   CARDIAC CATHETERIZATION  04/11/2006   trivial luminal irregularities in coronaries and mid LAD 50% (Dr. Aram Candela)   CARDIOPULMONARY MET TEST  05/05/2012   excellent effort w/RER 1.06, peak VO2>100%, peak HR 81%, good functional capacity   CAROTID DOPPLER  2005   normal study (ordered for swelling & pain, left neck clavicle to ear)   DILATION AND CURETTAGE OF UTERUS  1962-1968   x4   GALLBLADDER SURGERY  1985   MASTECTOMY Left 1981   MYOMECTOMY  1968   NM MYOCAR PERF WALL MOTION  2012   bruce myoview -no inducible ischemia, EF  71%, low risk scan   PERMANENT PACEMAKER INSERTION N/A 11/18/2013   Procedure: PERMANENT PACEMAKER INSERTION;  Surgeon: Marinus Maw, MD;  Location: [facility] CATH LAB;  Service: Cardiovascular;  Laterality: N/A;   PLACEMENT OF BREAST IMPLANTS  1993   [facility]   REMOVAL OF BILATERAL TISSUE EXPANDERS WITH PLACEMENT OF BILATERAL BREAST IMPLANTS     TOTAL HIP ARTHROPLASTY Right 08/02/2015   Procedure: RIGHT TOTAL HIP ARTHROPLASTY ANTERIOR APPROACH;  Surgeon: Durene Romans, MD;  Location: [facility];  Service: Orthopedics;  Laterality: Right;   TRANSTHORACIC ECHOCARDIOGRAM  2014   EF 55-60%, grade 1 diastolic dysfunction; mildly thickened MV leaflets, trivial regurg      Social History:  The patient  reports that she has never smoked. She has never used smokeless tobacco. She reports current alcohol use. She reports that she does not use drugs.   Family History:  The patient's family history  includes CAD in her brother and brother; Cancer in her brother, maternal grandfather, mother, and sister; Cervical cancer in her sister; Colon cancer in her sister; Colon cancer (age of onset: 31) in her mother; Heart attack in her brother; Heart disease in her maternal grandfather, maternal grandmother, mother, and paternal grandmother; Liver cancer in her brother; Lymphoma in her sister; Stroke in her brother, brother, maternal grandmother, mother, and paternal grandfather.    ROS:  Please see the history of present illness. All other systems are reviewed and  Negative to the above problem except as noted.    PHYSICAL EXAM: VS:  BP 122/70   Pulse 64   Ht 5\' 3"  (1.6 m)   Wt 165 lb (74.8 kg)   SpO2 96%   BMI 29.23 kg/m     GEN: Pt is in  no acute distress  HEENT: normal  Neck: no JVD Cardiac:  RRR  no LE  edema  Respiratory:  clear to auscultation  GI: soft, nontender   No hepatomegaly    EKG:  EKG is not  ordered today.    Echo Jan 2024      1. Left ventricular ejection fraction, by estimation, is 60 to 65%. The  left ventricle has normal function. The left ventricle has no regional  wall motion abnormalities. Left ventricular diastolic parameters are  consistent with Grade I diastolic  dysfunction (impaired relaxation). The average left ventricular global  longitudinal strain is -20.7 %. The global longitudinal strain is normal.   2. Right ventricular systolic function is normal. The right ventricular  size is normal. Tricuspid regurgitation signal is inadequate for assessing  PA pressure.   3. Right atrial size was mildly dilated.   4. The mitral valve is normal in structure. Trivial mitral valve  regurgitation. No evidence of mitral stenosis.   5. The aortic valve is normal in structure. Aortic valve regurgitation is  not visualized. No aortic stenosis is present.   6. Aortic dilatation noted. There is mild dilatation of the ascending  aorta, measuring 38 mm.   7. The  inferior vena cava is normal in size with greater than 50%  respiratory variability, suggesting right atrial pressure of 3 mmHg   Carotid USN  04/2020  Right Carotid: The extracranial vessels were near-normal with only minimal wall thickening or plaque. Left Carotid: There was no evidence of thrombus, dissection, atherosclerotic plaque or stenosis in the cervical carotid system. Final VIDALIA OFLYNN [id number] 05/06/2020 Incidental finding of bilateral thyroid nodules, both are hyperechoic in echo texture. Right dominant nodule 2.4 x 1.5 x  1.4 cm with small calcification within. Left inferior pole nodule .9 x .8 x .7 cm. Dedicated thyroid ultrasound is recommended if clinically warranted. *See table(s) above for measurements and observations. Electronically signed by Lorine Bears MD on 05/07/2020 at 2:59:39 PM. Vertebrals: Bilateral vertebral arteries demonstrate antegrade flow. Subclavians: Normal flow hemodynamics were seen in bilateral subclavian arteries. Lipid Panel    Component Value Date/Time   CHOL 176 10/28/2014 0440   TRIG 126 10/28/2014 0440   HDL 42 10/28/2014 0440   CHOLHDL 4.2 10/28/2014 0440   VLDL 25 10/28/2014 0440   LDLCALC 109 (H) 10/28/2014 0440      Wt Readings from Last 3 Encounters:  01/22/23 165 lb (74.8 kg)  10/20/22 154 lb (69.9 kg)  05/01/22 161 lb (73 kg)      ASSESSMENT AND PLAN:  1   CAD Hx of mild to mod CAD  Pt denies CP   Breathing overall OK   NO significant dyspnea   Follow      2  HTN  BP is controlled   Follow on meds     3  Hx PPM   F/U  in EP   4  Hx dizzinessNo complaints    5  Lipids Labs from Dr Perini's office:  LDL 53  HDL 54  Trig 69     6  CV dz   minimal  7  Metabolics   A1C 5.8   Watch/limit carbs    From a cardiac standpoint I think pt is at low risk for cardiac complication from colonscopy and OK to proceed    Current medicines are reviewed at length with the patient today.  The patient does not have concerns  regarding medicines.  Signed, Dietrich Pates, MD  01/22/2023 1:43 PM    [facility] [address] [place], [place], [place]  [address] Phone: [phone]; Fax: [phone]

## 2023-01-22 ENCOUNTER — Encounter: Payer: Self-pay | Admitting: Internal Medicine

## 2023-01-22 ENCOUNTER — Ambulatory Visit: Payer: PPO | Attending: Internal Medicine | Admitting: Internal Medicine

## 2023-01-22 DIAGNOSIS — I2510 Atherosclerotic heart disease of native coronary artery without angina pectoris: Secondary | ICD-10-CM

## 2023-01-22 DIAGNOSIS — M1611 Unilateral primary osteoarthritis, right hip: Secondary | ICD-10-CM | POA: Diagnosis not present

## 2023-01-22 DIAGNOSIS — M8580 Other specified disorders of bone density and structure, unspecified site: Secondary | ICD-10-CM | POA: Diagnosis not present

## 2023-01-22 DIAGNOSIS — I7090 Unspecified atherosclerosis: Secondary | ICD-10-CM | POA: Diagnosis not present

## 2023-01-22 DIAGNOSIS — R82998 Other abnormal findings in urine: Secondary | ICD-10-CM | POA: Diagnosis not present

## 2023-01-22 DIAGNOSIS — Z0000 Encounter for general adult medical examination without abnormal findings: Secondary | ICD-10-CM | POA: Diagnosis not present

## 2023-01-22 DIAGNOSIS — J45909 Unspecified asthma, uncomplicated: Secondary | ICD-10-CM | POA: Diagnosis not present

## 2023-01-22 DIAGNOSIS — Z950 Presence of cardiac pacemaker: Secondary | ICD-10-CM | POA: Diagnosis not present

## 2023-01-22 DIAGNOSIS — I10 Essential (primary) hypertension: Secondary | ICD-10-CM | POA: Diagnosis not present

## 2023-01-22 DIAGNOSIS — E785 Hyperlipidemia, unspecified: Secondary | ICD-10-CM | POA: Diagnosis not present

## 2023-01-22 DIAGNOSIS — I77810 Thoracic aortic ectasia: Secondary | ICD-10-CM | POA: Diagnosis not present

## 2023-01-22 DIAGNOSIS — Z23 Encounter for immunization: Secondary | ICD-10-CM | POA: Diagnosis not present

## 2023-01-22 MED ORDER — NITROGLYCERIN 0.4 MG SL SUBL: 0.4000 mg | SUBLINGUAL_TABLET | SUBLINGUAL | 3 refills | Status: AC | PRN

## 2023-01-22 NOTE — Patient Instructions (Signed)
Medication Instructions:   *If you need a refill on your cardiac medications before your next appointment, please call your pharmacy*   Lab Work:  If you have labs (blood work) drawn today and your tests are completely normal, you will receive your results only by: MyChart Message (if you have MyChart) OR A paper copy in the mail If you have any lab test that is abnormal or we need to change your treatment, we will call you to review the results.   Testing/Procedures:    Follow-Up: At [facility], you and your health needs are our priority.  As part of our continuing mission to provide you with exceptional heart care, we have created designated Provider Care Teams.  These Care Teams include your primary Cardiologist (physician) and Advanced Practice Providers (APPs -  Physician Assistants and Nurse Practitioners) who all work together to provide you with the care you need, when you need it.  We recommend signing up for the patient portal called "MyChart".  Sign up information is provided on this After Visit Summary.  MyChart is used to connect with patients for Virtual Visits (Telemedicine).  Patients are able to view lab/test results, encounter notes, upcoming appointments, etc.  Non-urgent messages can be sent to your provider as well.   To learn more about what you can do with MyChart, go to [email or web address].    Your next appointment:  end of July 2025

## 2023-02-07 DIAGNOSIS — M85852 Other specified disorders of bone density and structure, left thigh: Secondary | ICD-10-CM | POA: Diagnosis not present

## 2023-02-07 DIAGNOSIS — Z780 Asymptomatic menopausal state: Secondary | ICD-10-CM | POA: Diagnosis not present

## 2023-02-19 ENCOUNTER — Ambulatory Visit: Payer: PPO | Admitting: Podiatry

## 2023-02-25 ENCOUNTER — Encounter: Payer: Self-pay | Admitting: Podiatry

## 2023-02-25 ENCOUNTER — Ambulatory Visit (INDEPENDENT_AMBULATORY_CARE_PROVIDER_SITE_OTHER): Payer: PPO | Admitting: Podiatry

## 2023-02-25 DIAGNOSIS — B351 Tinea unguium: Secondary | ICD-10-CM | POA: Diagnosis not present

## 2023-02-25 DIAGNOSIS — G609 Hereditary and idiopathic neuropathy, unspecified: Secondary | ICD-10-CM

## 2023-02-25 DIAGNOSIS — M79675 Pain in left toe(s): Secondary | ICD-10-CM

## 2023-02-25 DIAGNOSIS — M79674 Pain in right toe(s): Secondary | ICD-10-CM

## 2023-02-25 MED ORDER — CICLOPIROX 8 % EX SOLN: Freq: Every day | CUTANEOUS | 2 refills | Status: DC

## 2023-02-26 ENCOUNTER — Encounter: Payer: Self-pay | Admitting: Podiatry

## 2023-02-26 NOTE — Progress Notes (Signed)
  Subjective:  Patient ID: Madison [name], female    DOB: [birth year],  MRN: [id number]  Chief Complaint  Patient presents with   Debridement    Trim toenails and patient states she has neuropathy but hasn't had any treatment    89 y.o. female presents with the above complaint. History confirmed with patient.  Still using the Penlac.  Feels like this is helping.  Debridements have been helpful in reducing pain and improving function.  Noting increasing issues with some burning tingling sharp electrical shooting type pain  Objective:  Physical Exam: warm, good capillary refill, no trophic changes or ulcerative lesions, normal DP and PT pulses, and abnormal sensory exam. Left Foot: dystrophic yellowed discolored nail plates with subungual debris Right Foot: dystrophic yellowed discolored nail plates with subungual debris   Assessment:   1. Pain due to onychomycosis of toenails of both feet   2. Idiopathic peripheral neuropathy      Plan:  Patient was evaluated and treated and all questions answered.  Discussed the etiology and treatment options for the condition in detail with the patient. Educated patient on the topical and oral treatment options for mycotic nails. Recommended debridement of the nails today. Sharp and mechanical debridement performed of all painful and mycotic nails today. Nails debrided in length and thickness using a nail nipper to level of comfort. Discussed treatment options including appropriate shoe gear. Follow up as needed for painful nails.  Continue treatment with Penlac.  Refill sent to pharmacy  We also discussed idiopathic peripheral neuropathy and how this can develop with aging.  We discussed symptomatic treatment of this including use of oral medication such as gabapentin or Lyrica.  Currently this is fairly intermittent and bothersome and she is working on having her osteoporosis addressed with medications and would like to hold off on adding any new  medications before discussing further with her PCP.  She would let me know if she would like to use this in the future.  We discussed that gabapentin can be helpful with pain but they usually does not help with numbness and can cause side effects such as sedation dizziness and increasing fall risk.  Likely would be best to avoid these if possible if the pain is tolerable.  She will follow-up me as needed for her neuropathy  No follow-ups on file.

## 2023-03-01 DIAGNOSIS — Z961 Presence of intraocular lens: Secondary | ICD-10-CM | POA: Diagnosis not present

## 2023-03-01 DIAGNOSIS — H353131 Nonexudative age-related macular degeneration, bilateral, early dry stage: Secondary | ICD-10-CM | POA: Diagnosis not present

## 2023-03-01 DIAGNOSIS — H52201 Unspecified astigmatism, right eye: Secondary | ICD-10-CM | POA: Diagnosis not present

## 2023-03-01 DIAGNOSIS — H401132 Primary open-angle glaucoma, bilateral, moderate stage: Secondary | ICD-10-CM | POA: Diagnosis not present

## 2023-03-01 DIAGNOSIS — H349 Unspecified retinal vascular occlusion: Secondary | ICD-10-CM | POA: Diagnosis not present

## 2023-03-13 ENCOUNTER — Ambulatory Visit (INDEPENDENT_AMBULATORY_CARE_PROVIDER_SITE_OTHER): Payer: PPO

## 2023-03-13 DIAGNOSIS — I442 Atrioventricular block, complete: Secondary | ICD-10-CM | POA: Diagnosis not present

## 2023-03-14 ENCOUNTER — Telehealth: Payer: Self-pay

## 2023-03-14 NOTE — Telephone Encounter (Signed)
The pt states she got a call stating her transmission did not come through. I let her know that the last one we received in September. I told her she can send one anytime today.

## 2023-03-15 LAB — CUP PACEART REMOTE DEVICE CHECK
Battery Impedance: 1454 Ohm
Battery Remaining Longevity: 47 mo
Battery Voltage: 2.76 V
Brady Statistic AP VP Percent: 0 %
Brady Statistic AP VS Percent: 99 %
Brady Statistic AS VP Percent: 0 %
Brady Statistic AS VS Percent: 0 %
Date Time Interrogation Session: 20241219204324
Implantable Lead Connection Status: 753985
Implantable Lead Connection Status: 753985
Implantable Lead Implant Date: 20150826
Implantable Lead Implant Date: 20150826
Implantable Lead Location: 753859
Implantable Lead Location: 753860
Implantable Lead Model: 5076
Implantable Lead Model: 5076
Implantable Pulse Generator Implant Date: 20150826
Lead Channel Impedance Value: 446 Ohm
Lead Channel Impedance Value: 667 Ohm
Lead Channel Pacing Threshold Amplitude: 0.5 V
Lead Channel Pacing Threshold Amplitude: 0.5 V
Lead Channel Pacing Threshold Pulse Width: 0.4 ms
Lead Channel Pacing Threshold Pulse Width: 0.4 ms
Lead Channel Setting Pacing Amplitude: 2 V
Lead Channel Setting Pacing Amplitude: 2.5 V
Lead Channel Setting Pacing Pulse Width: 0.4 ms
Lead Channel Setting Sensing Sensitivity: 4 mV
Zone Setting Status: 755011
Zone Setting Status: 755011

## 2023-04-08 DIAGNOSIS — I10 Essential (primary) hypertension: Secondary | ICD-10-CM | POA: Diagnosis not present

## 2023-04-08 DIAGNOSIS — Z950 Presence of cardiac pacemaker: Secondary | ICD-10-CM | POA: Diagnosis not present

## 2023-04-08 DIAGNOSIS — N390 Urinary tract infection, site not specified: Secondary | ICD-10-CM | POA: Diagnosis not present

## 2023-04-08 DIAGNOSIS — F4322 Adjustment disorder with anxiety: Secondary | ICD-10-CM | POA: Diagnosis not present

## 2023-04-08 DIAGNOSIS — I2510 Atherosclerotic heart disease of native coronary artery without angina pectoris: Secondary | ICD-10-CM | POA: Diagnosis not present

## 2023-04-08 DIAGNOSIS — E785 Hyperlipidemia, unspecified: Secondary | ICD-10-CM | POA: Diagnosis not present

## 2023-04-08 DIAGNOSIS — R7301 Impaired fasting glucose: Secondary | ICD-10-CM | POA: Diagnosis not present

## 2023-04-08 DIAGNOSIS — R350 Frequency of micturition: Secondary | ICD-10-CM | POA: Diagnosis not present

## 2023-04-14 NOTE — Progress Notes (Unsigned)
Cardiology Office Note   Date:  04/15/2023   ID:  Madison [name], Madison [name] Sep 06, 1933, MRN [id number]  PCP:  Rodrigo Ran, MD  Cardiologist:   Dietrich Pates, MD   F/U of HTN and CAD    History of Present Illness: Madison [name] is a 89 y.o. female with a history of mild to mod CAD  Myovue in 2015 was normal Echo showed normal LVEF Cardopulmonary stress test consistent with deconditioning.   The pt also has hx of HTN, PVCs,  sinus node dysfunction (s/p PPM in 2015), GERD, HL   Echo done in Dec 2023 showed  Normal LVEF and RVEF  Mld diastolic dysfunction  I saw the pt in OCt 2024  The pt comes in today complaining of hearing blood beating In her head when she lays down   Can't sleep   She says it has been happening for awhile   Has been seen by ENT  Nothing found   Denies dizziness   No palpitations    Has occsaional L neck/head pain, intermittent  Has occurred for years, no change.  Allergies:   Tape, Codeine, Latex, Other, Oxycodone-acetaminophen, Penicillins, Percodan [oxycodone-aspirin], Pravastatin, Repatha [evolocumab], Vancomycin, and Epinephrine   Past Medical History:  Diagnosis Date   Acquired absence of breast and nipple    Acute myocardial infarction, unspecified site, episode of care unspecified    1965 and 2015    ALLERGIC RHINITIS    Anginal pain (HCC)    Arthritis    Asthma    Blindness of left eye    decreased vision in left eye related to ocular occlusion    Breast cancer (HCC)    left mastectomy   Complication of anesthesia    Coronary atherosclerosis    Cough    Dysfunction of eustachian tube    Esophageal reflux    Glaucoma    Heart murmur    Hemorrhoids    Hyperlipidemia    Hypertension    Lymphedema    Pacemaker 2015   Dr Ladona Ridgel   Peripheral vascular disease (HCC)    Pneumonia    hx of walking pneumonia x 2    PONV (postoperative nausea and vomiting)    Presence of permanent cardiac pacemaker    PVC's (premature ventricular contractions)     Shortness of breath    Stress incontinence    Syncope 11/17/2013    Past Surgical History:  Procedure Laterality Date   ABDOMINAL HYSTERECTOMY  1967   CARDIAC CATHETERIZATION  03/01/1986   normal coronaries (Dr. Aram Candela)   CARDIAC CATHETERIZATION  10/25/2002   normal L main; LAD w/40% narrowing in prox 3rd and 60-70% narrowing beyond 1st diagonal, LAD was tortuous; dominant RCA with 30-40% segmental narrowing and 20-30% narrowing at junction of prox 3rd (Dr. Jonette Eva)   CARDIAC CATHETERIZATION  04/11/2006   trivial luminal irregularities in coronaries and mid LAD 50% (Dr. Aram Candela)   CARDIOPULMONARY MET TEST  05/05/2012   excellent effort w/RER 1.06, peak VO2>100%, peak HR 81%, good functional capacity   CAROTID DOPPLER  2005   normal study (ordered for swelling & pain, left neck clavicle to ear)   DILATION AND CURETTAGE OF UTERUS  1962-1968   x4   GALLBLADDER SURGERY  1985   MASTECTOMY Left 1981   MYOMECTOMY  1968   NM MYOCAR PERF WALL MOTION  2012   bruce myoview -no inducible ischemia, EF 71%, low risk scan   PERMANENT PACEMAKER INSERTION N/A 11/18/2013  Procedure: PERMANENT PACEMAKER INSERTION;  Surgeon: Marinus Maw, MD;  Location: [facility] CATH LAB;  Service: Cardiovascular;  Laterality: N/A;   PLACEMENT OF BREAST IMPLANTS  1993   [facility]   REMOVAL OF BILATERAL TISSUE EXPANDERS WITH PLACEMENT OF BILATERAL BREAST IMPLANTS     TOTAL HIP ARTHROPLASTY Right 08/02/2015   Procedure: RIGHT TOTAL HIP ARTHROPLASTY ANTERIOR APPROACH;  Surgeon: Durene Romans, MD;  Location: [facility];  Service: Orthopedics;  Laterality: Right;   TRANSTHORACIC ECHOCARDIOGRAM  2014   EF 55-60%, grade 1 diastolic dysfunction; mildly thickened MV leaflets, trivial regurg      Social History:  The patient  reports that she has never smoked. She has never used smokeless tobacco. She reports current alcohol use. She reports that she does not use drugs.   Family History:  The patient's family history includes CAD in  her brother and brother; Cancer in her brother, maternal grandfather, mother, and sister; Cervical cancer in her sister; Colon cancer in her sister; Colon cancer (age of onset: 26) in her mother; Heart attack in her brother; Heart disease in her maternal grandfather, maternal grandmother, mother, and paternal grandmother; Liver cancer in her brother; Lymphoma in her sister; Stroke in her brother, brother, maternal grandmother, mother, and paternal grandfather.    ROS:  Please see the history of present illness. All other systems are reviewed and  Negative to the above problem except as noted.    PHYSICAL EXAM: VS:  BP 114/72   Pulse 65   Ht 5\' 3"  (1.6 m)   Wt 161 lb (73 kg)   SpO2 95%   BMI 28.52 kg/m    GEN: Pt is in NAD HEENT: normal  Neck: JVP is normal  No bruits  Cardiac:  RRR No murmurs    No LE  edema  Respiratory:  clear to auscultation  GI: soft, nontender   No hepatomegaly    EKG:  EKG is not  ordered today.    Echo Jan 2024      1. Left ventricular ejection fraction, by estimation, is 60 to 65%. The  left ventricle has normal function. The left ventricle has no regional  wall motion abnormalities. Left ventricular diastolic parameters are  consistent with Grade I diastolic  dysfunction (impaired relaxation). The average left ventricular global  longitudinal strain is -20.7 %. The global longitudinal strain is normal.   2. Right ventricular systolic function is normal. The right ventricular  size is normal. Tricuspid regurgitation signal is inadequate for assessing  PA pressure.   3. Right atrial size was mildly dilated.   4. The mitral valve is normal in structure. Trivial mitral valve  regurgitation. No evidence of mitral stenosis.   5. The aortic valve is normal in structure. Aortic valve regurgitation is  not visualized. No aortic stenosis is present.   6. Aortic dilatation noted. There is mild dilatation of the ascending  aorta, measuring 38 mm.   7. The  inferior vena cava is normal in size with greater than 50%  respiratory variability, suggesting right atrial pressure of 3 mmHg   Carotid USN  04/2020  Right Carotid: The extracranial vessels were near-normal with only minimal wall thickening or plaque. Left Carotid: There was no evidence of thrombus, dissection, atherosclerotic plaque or stenosis in the cervical carotid system. Incidental finding of bilateral thyroid nodules, both are hyperechoic in echo texture. Right dominant nodule 2.4 x 1.5 x 1.4 cm with small calcification within. Left inferior pole nodule .9 x .8 x .7  cm. Dedicated thyroid ultrasound is recommended if clinically warranted. *See table(s) above for measurements and observations. Electronically signed by Lorine Bears MD on 05/07/2020 at 2:59:39 PM. Vertebrals: Bilateral vertebral arteries demonstrate antegrade flow. Subclavians: Normal flow hemodynamics were seen in bilateral subclavian arteries.  Lipid Panel    Component Value Date/Time   CHOL 176 10/28/2014 0440   TRIG 126 10/28/2014 0440   HDL 42 10/28/2014 0440   CHOLHDL 4.2 10/28/2014 0440   VLDL 25 10/28/2014 0440   LDLCALC 109 (H) 10/28/2014 0440      Wt Readings from Last 3 Encounters:  04/15/23 161 lb (73 kg)  01/22/23 165 lb (74.8 kg)  10/20/22 154 lb (69.9 kg)      ASSESSMENT AND PLAN:  1  Postional bruits   the pt has continued to have problems with bruits when she lays down.  Severe sleep interruption.    CTA of neck, head in 2023 showed mild atherosclerosis of aorta  No significant carotid or intracranial dz.(Incidental thyroid nodule noted I will need to review with radiology   Consider neuro referral     Pt says she notes symptosm started a couple year ago, about the same time atenolol increased Will see if changing doses help  Cut back on atenolol to 1 every day  Add losartan 25 mg back    Follow BP   Follow symptoms   2  HTN   Follow with changes as noted above     3 CAD Hx of  mild to mod CAD  Pt denies CP   4  Hx PPM   Follows with EP       5  Lipids  Continue on lipitor  Get labs from Dr Waynard Edwards    6  CV dz   minimal CV dz       From a cardiac standpoint I think pt is at low risk for cardiac complication from colonscopy and OK to proceed    Current medicines are reviewed at length with the patient today.  The patient does not have concerns regarding medicines.  Signed, Dietrich Pates, MD  04/15/2023 10:13 PM    [facility] [address] [place], [place], [place]  [address] Phone: [phone]; Fax: [phone]

## 2023-04-15 ENCOUNTER — Ambulatory Visit: Payer: PPO | Attending: Internal Medicine | Admitting: Internal Medicine

## 2023-04-15 ENCOUNTER — Encounter: Payer: Self-pay | Admitting: Internal Medicine

## 2023-04-15 VITALS — BP 114/72 | HR 65 | Ht 63.0 in | Wt 161.0 lb

## 2023-04-15 DIAGNOSIS — I10 Essential (primary) hypertension: Secondary | ICD-10-CM

## 2023-04-15 MED ORDER — ATENOLOL 25 MG PO TABS: 25.0000 mg | ORAL_TABLET | Freq: Every evening | ORAL | Status: DC

## 2023-04-15 MED ORDER — LOSARTAN POTASSIUM 25 MG PO TABS: 25.0000 mg | ORAL_TABLET | Freq: Every morning | ORAL | 3 refills | Status: DC

## 2023-04-15 NOTE — Patient Instructions (Signed)
Medication Instructions:  DECREASE ATENOLOL TO 25 MG IN THE EVENING ONLY START LOSARTAN 25 MG IN THE MORNING  *If you need a refill on your cardiac medications before your next appointment, please call your pharmacy*   Lab Work:  If you have labs (blood work) drawn today and your tests are completely normal, you will receive your results only by: MyChart Message (if you have MyChart) OR A paper copy in the mail If you have any lab test that is abnormal or we need to change your treatment, we will call you to review the results.   Testing/Procedures:    Follow-Up: At [facility], you and your health needs are our priority.  As part of our continuing mission to provide you with exceptional heart care, we have created designated Provider Care Teams.  These Care Teams include your primary Cardiologist (physician) and Advanced Practice Providers (APPs -  Physician Assistants and Nurse Practitioners) who all work together to provide you with the care you need, when you need it.  We recommend signing up for the patient portal called "MyChart".  Sign up information is provided on this After Visit Summary.  MyChart is used to connect with patients for Virtual Visits (Telemedicine).  Patients are able to view lab/test results, encounter notes, upcoming appointments, etc.  Non-urgent messages can be sent to your provider as well.   To learn more about what you can do with MyChart, go to [email or web address].

## 2023-04-22 ENCOUNTER — Telehealth: Payer: Self-pay | Admitting: Internal Medicine

## 2023-04-22 ENCOUNTER — Ambulatory Visit: Payer: PPO | Admitting: Internal Medicine

## 2023-04-22 NOTE — Addendum Note (Signed)
Addended by: Geralyn Flash D on: 04/22/2023 12:47 PM   Modules accepted: Orders

## 2023-04-22 NOTE — Progress Notes (Signed)
Remote pacemaker transmission.

## 2023-04-22 NOTE — Telephone Encounter (Signed)
Called patinet  I have reviewed CT scan with radiologist   No anomalies found that would explain pt's auditory complaints( pulsing, whooshing sound)  BP 110s to 130s/     Could take 25 mg atenolol bid (she says she does better when HR lower) Keep on losartan  Keep following BP   Send in log with readings

## 2023-04-30 DIAGNOSIS — E538 Deficiency of other specified B group vitamins: Secondary | ICD-10-CM | POA: Diagnosis not present

## 2023-04-30 DIAGNOSIS — I10 Essential (primary) hypertension: Secondary | ICD-10-CM | POA: Diagnosis not present

## 2023-04-30 DIAGNOSIS — R42 Dizziness and giddiness: Secondary | ICD-10-CM | POA: Diagnosis not present

## 2023-04-30 DIAGNOSIS — M47812 Spondylosis without myelopathy or radiculopathy, cervical region: Secondary | ICD-10-CM | POA: Diagnosis not present

## 2023-04-30 DIAGNOSIS — I2510 Atherosclerotic heart disease of native coronary artery without angina pectoris: Secondary | ICD-10-CM | POA: Diagnosis not present

## 2023-04-30 DIAGNOSIS — F4321 Adjustment disorder with depressed mood: Secondary | ICD-10-CM | POA: Diagnosis not present

## 2023-04-30 DIAGNOSIS — F4322 Adjustment disorder with anxiety: Secondary | ICD-10-CM | POA: Diagnosis not present

## 2023-04-30 DIAGNOSIS — R7301 Impaired fasting glucose: Secondary | ICD-10-CM | POA: Diagnosis not present

## 2023-04-30 DIAGNOSIS — Z950 Presence of cardiac pacemaker: Secondary | ICD-10-CM | POA: Diagnosis not present

## 2023-04-30 DIAGNOSIS — E785 Hyperlipidemia, unspecified: Secondary | ICD-10-CM | POA: Diagnosis not present

## 2023-05-02 DIAGNOSIS — H34812 Central retinal vein occlusion, left eye, with macular edema: Secondary | ICD-10-CM | POA: Diagnosis not present

## 2023-05-02 DIAGNOSIS — H25812 Combined forms of age-related cataract, left eye: Secondary | ICD-10-CM | POA: Diagnosis not present

## 2023-05-02 DIAGNOSIS — H401131 Primary open-angle glaucoma, bilateral, mild stage: Secondary | ICD-10-CM | POA: Diagnosis not present

## 2023-05-02 DIAGNOSIS — H353132 Nonexudative age-related macular degeneration, bilateral, intermediate dry stage: Secondary | ICD-10-CM | POA: Diagnosis not present

## 2023-05-13 NOTE — Therapy (Signed)
OUTPATIENT PHYSICAL THERAPY VESTIBULAR EVALUATION     Patient Name: Madison [name] MRN: [id number] DOB:[birth year], 89 y.o., female Today's Date: 05/14/2023  END OF SESSION:  PT End of Session - 05/14/23 1008     Visit Number 1    Number of Visits 7    Date for PT Re-Evaluation 06/25/23    Authorization Type [employer]    PT Start Time 0922    PT Stop Time 1007    PT Time Calculation (min) 45 min    Activity Tolerance Patient tolerated treatment well    Behavior During Therapy [facility] for tasks assessed/performed             Past Medical History:  Diagnosis Date   Acquired absence of breast and nipple    Acute myocardial infarction, unspecified site, episode of care unspecified    1965 and 2015    ALLERGIC RHINITIS    Anginal pain (HCC)    Arthritis    Asthma    Blindness of left eye    decreased vision in left eye related to ocular occlusion    Breast cancer (HCC)    left mastectomy   Complication of anesthesia    Coronary atherosclerosis    Cough    Dysfunction of eustachian tube    Esophageal reflux    Glaucoma    Heart murmur    Hemorrhoids    Hyperlipidemia    Hypertension    Lymphedema    Pacemaker 2015   Dr Ladona Ridgel   Peripheral vascular disease (HCC)    Pneumonia    hx of walking pneumonia x 2    PONV (postoperative nausea and vomiting)    Presence of permanent cardiac pacemaker    PVC's (premature ventricular contractions)    Shortness of breath    Stress incontinence    Syncope 11/17/2013   Past Surgical History:  Procedure Laterality Date   ABDOMINAL HYSTERECTOMY  1967   CARDIAC CATHETERIZATION  03/01/1986   normal coronaries (Dr. Aram Candela)   CARDIAC CATHETERIZATION  10/25/2002   normal L main; LAD w/40% narrowing in prox 3rd and 60-70% narrowing beyond 1st diagonal, LAD was tortuous; dominant RCA with 30-40% segmental narrowing and 20-30% narrowing at junction of prox 3rd (Dr. Jonette Eva)   CARDIAC CATHETERIZATION  04/11/2006    trivial luminal irregularities in coronaries and mid LAD 50% (Dr. Aram Candela)   CARDIOPULMONARY MET TEST  05/05/2012   excellent effort w/RER 1.06, peak VO2>100%, peak HR 81%, good functional capacity   CAROTID DOPPLER  2005   normal study (ordered for swelling & pain, left neck clavicle to ear)   DILATION AND CURETTAGE OF UTERUS  1962-1968   x4   GALLBLADDER SURGERY  1985   MASTECTOMY Left 1981   MYOMECTOMY  1968   NM MYOCAR PERF WALL MOTION  2012   bruce myoview -no inducible ischemia, EF 71%, low risk scan   PERMANENT PACEMAKER INSERTION N/A 11/18/2013   Procedure: PERMANENT PACEMAKER INSERTION;  Surgeon: Marinus Maw, MD;  Location: [facility] CATH LAB;  Service: Cardiovascular;  Laterality: N/A;   PLACEMENT OF BREAST IMPLANTS  1993   [facility]   REMOVAL OF BILATERAL TISSUE EXPANDERS WITH PLACEMENT OF BILATERAL BREAST IMPLANTS     TOTAL HIP ARTHROPLASTY Right 08/02/2015   Procedure: RIGHT TOTAL HIP ARTHROPLASTY ANTERIOR APPROACH;  Surgeon: Durene Romans, MD;  Location: [facility] ORS;  Service: Orthopedics;  Laterality: Right;   TRANSTHORACIC ECHOCARDIOGRAM  2014   EF 55-60%, grade 1 diastolic dysfunction;  mildly thickened MV leaflets, trivial regurg    Patient Active Problem List   Diagnosis Date Noted   Primary open angle glaucoma (POAG) of both eyes, mild stage 07/11/2017   Upper airway cough syndrome 03/21/2017   Combined forms of age-related cataract of left eye 02/21/2017   Pseudophakia of right eye 02/21/2017   Bilateral impacted cerumen 12/12/2016   ETD (Eustachian tube dysfunction), bilateral 12/12/2016   Rhinitis, chronic 12/12/2016   S/P right THA, AA 08/02/2015   Sinus node dysfunction (HCC) 01/05/2015   Central retinal vein occlusion with macular edema of left eye 12/23/2014   Glaucoma suspect of both eyes 12/23/2014   Headache 10/28/2014   Overweight (BMI 25.0-29.9) 10/28/2014   Subjective visual disturbance, left eye 10/28/2014   Glaucoma 10/28/2014   Earache on left 06/23/2014    Colon cancer high risk 05/07/2014   Irritable bowel syndrome without diarrhea 05/07/2014   CAD in native artery 12/23/2013   Pacemaker 12/23/2013   CHB with near syncope 12/10/2013   Cardiac pacemaker in situ- MDT 11/18/13 12/10/2013   Near syncope 11/17/2013   Sinus pause 11/17/2013   Old MI (myocardial infarction) 11/17/2013   HTN (hypertension) 11/13/2013   Dyslipidemia 11/13/2013   PVC's (premature ventricular contractions) 11/13/2013   Acute myocardial infarction (HCC) 05/20/2008   EUSTACHIAN TUBE DYSFUNCTION 01/11/2007   CORONARY ARTERY DISEASE 01/11/2007   ALLERGIC RHINITIS 01/11/2007   Asthmatic bronchitis with exacerbation 01/11/2007   GERD 01/11/2007   MASTECTOMY, LEFT, HX OF 01/11/2007    PCP: Rodrigo Ran, MD  REFERRING PROVIDER: Kathaleen Maser, NP  REFERRING DIAG: R26.89 (ICD-10-CM) - Imbalance R42 (ICD-10-CM) - Dizziness  THERAPY DIAG:  Unsteadiness on feet  Other abnormalities of gait and mobility  ONSET DATE: 6-8 months  Rationale for Evaluation and Treatment: Rehabilitation  SUBJECTIVE:   SUBJECTIVE STATEMENT:  Patient reports having episodes of vertigo 6-8 months ago, but since then it has changed it to more of brain fog and inability to concentrate. No longer having any dizziness. Reports some changes in balance; stopped going to tai chi d/t fear of falling. Reports having had COVID in August 2024. Reports concerns about her pacer having something to do with her sx. Denies vision changes/double vision, hearing loss, migraines, significant neck pain. Reports fall with head trauma about 2 months ago over the L forehead "and it didn't even bruise." Reports hx glaucoma and beginnings of macular degeneration, burst L eardrum from getting hit by a basketball as a kid, being diagnosed with pulsatile tinnitus and reports that certain head positions will make it go away. Reports some memory changes and got lost on her way here.   Pt accompanied by:  self  PERTINENT HISTORY: MI x2, breast CA c/p L mastectomy, lymphedema, L eye blindness, HLD, HTN, pacemaker, PVD, PVC's. R THA  PAIN:  Are you having pain? No  PRECAUTIONS: ICD/Pacemaker and Other: lymphedema  RED FLAGS: None   WEIGHT BEARING RESTRICTIONS: No  FALLS: Has patient fallen in last 6 months? Yes. Number of falls 1  LIVING ENVIRONMENT: Lives with: lives alone  Lives in: Other in an independent living facility "[facility]" Stairs: No Has following equipment at home: Single point cane, Environmental consultant - 2 wheeled, Wheelchair (manual), shower chair, and Grab bars  PLOF: Independent; gets housekeeping 2x/month  PATIENT GOALS: "figure out what is going on"  OBJECTIVE:  Note: Objective measures were completed at Evaluation unless otherwise noted.  DIAGNOSTIC FINDINGS: 10/04/21 head/neck CT angio: Head CT: No acute finding. Age related volume loss. Mild  chronic small-vessel ischemic change of the white matter.CT angiography of the head and neck: Aortic atherosclerosis, mild. No carotid bifurcation disease. Anterior and posterior intracranial circulation is normal. No evidence of vessel occlusion or proximal stenosis.  COGNITION: Overall cognitive status: Within functional limits for tasks assessed   SENSATION: Reports some "prickly/burning" in feet occasionally  POSTURE:  Tremor in head/neck (reports ongoing for 30 years)  GAIT: Gait pattern:  cane slightly too high; slight forward lean, slightly unsteady  Assistive device utilized: Single point cane Level of assistance: Modified independence   FUNCTIONAL TESTS:  Tandem walk: requires min A   PATIENT SURVEYS:  DHI 40/100 * did not use this as goal as patient later denied dizziness  VESTIBULAR ASSESSMENT:  GENERAL OBSERVATION: pt wears readers  OCULOMOTOR EXAM:  Ocular Alignment:  L ptosis   (pt reports hx of stroke in L eye)  Ocular ROM: No Limitations  Spontaneous Nystagmus: absent  Gaze-Induced Nystagmus:  absent  Smooth Pursuits: saccades few in vertical direction  Saccades: intact  Convergence/Divergence: no c/o diplopia during testing  VESTIBULAR - OCULAR REFLEX:   Slow VOR: Normal; c/o "crunching" in neck  VOR Cancellation: Normal; visible saccadic instructions at end point  Head-Impulse Test: HIT Right: negative HIT Left: negative     POSITIONAL TESTING:  Right Roll Test: negative  Left Roll Test: negative  Right Dix-Hallpike: negative  Left Dix-Hallpike: negative                                                                                                                            TREATMENT DATE: 05/14/23   PATIENT EDUCATION: Education details: edu on exam findings, prognosis, POC, discussed possibility of trying PT to address balance concerns; advised to f/u with her PCP about pacemaker and cognitive concerns  Person educated: Patient Education method: Explanation Education comprehension: verbalized understanding  HOME EXERCISE PROGRAM: Not yet initiated     GOALS: Goals reviewed with patient? Yes  SHORT TERM GOALS: Target date: 06/04/2023  Patient to be independent with initial HEP. Baseline: HEP initiated Goal status: INITIAL    LONG TERM GOALS: Target date: 06/25/2023  Patient to be independent with advanced HEP. Baseline: Not yet initiated  Goal status: INITIAL  Patient to demonstrate mild-moderate sway for 30 seconds on MCTSIB condition 4 to improve stability in dim lighting and uneven surfaces.  Baseline: NT Goal status: INITIAL  Patient to score at least 20/24 on DGI in order to decrease risk of falls.  Baseline: NT Goal status: INITIAL  Patient to return to modified community exercise program with no safety concerns. Baseline: has stopped exercise classes Goal status: INITIAL  Patient to demonstrate 5xSTS test in <15 sec in order to decrease risk of falls.  Baseline: NT Goal status: INITIAL      ASSESSMENT:  CLINICAL  IMPRESSION:   Patient is a 89 y/o F presenting to OPPT with c/o brain fog and imbalance. Patient reports hx of an episode of dizziness 6-8  months ago but this has since resolved. Denies vision changes/double vision, hearing loss, migraines, significant neck pain. Reports fall with head trauma about 2 months ago over the L forehead, hx glaucoma and beginnings of macular degeneration, hx burst L eardrum, pulsatile tinnitus, memory changes. Patient today presenting with L ptosis (pt reports this is chronic), vertical saccades, and imbalance with gait. Otherwise, vestibular exam grossly normal and patient was asymptomatic. Plan to do thorough balance testing next session to address patient's concerns about her instability. Would benefit from skilled PT services 1x/week for 6 weeks to address balance.    OBJECTIVE IMPAIRMENTS: Abnormal gait, decreased activity tolerance, and decreased balance.   ACTIVITY LIMITATIONS: carrying, lifting, bending, standing, squatting, stairs, transfers, bathing, toileting, dressing, reach over head, hygiene/grooming, and locomotion level  PARTICIPATION LIMITATIONS: meal prep, cleaning, laundry, shopping, community activity, and church  PERSONAL FACTORS: Age, Past/current experiences, Time since onset of injury/illness/exacerbation, and 3+ comorbidities: MI x2, breast CA c/p L mastectomy, lymphedema, L eye blindness, HLD, HTN, pacemaker, PVD, PVC's. R THA  are also affecting patient's functional outcome.   REHAB POTENTIAL: Good  CLINICAL DECISION MAKING: Evolving/moderate complexity  EVALUATION COMPLEXITY: Moderate   PLAN:  PT FREQUENCY: 1x/week  PT DURATION: 6 weeks  PLANNED INTERVENTIONS: 97164- PT Re-evaluation, 97110-Therapeutic exercises, 97530- Therapeutic activity, 97112- Neuromuscular re-education, 97535- Self Care, 16109- Manual therapy, [phone]- Gait training, [phone]- Canalith repositioning, Patient/Family education, Balance training, Stair training, Taping,  Dry Needling, Vestibular training, Cryotherapy, and Moist heat  PLAN FOR NEXT SESSION: DGI, MCTSIB, 5xSTS, talk about exercise classes to safely participate in     Baldemar Friday, [place], DPT 05/14/23 10:09 AM  [facility] at [facility] [address] [place], [place] [address] Phone # [phone] Fax # [phone]

## 2023-05-14 ENCOUNTER — Encounter: Payer: Self-pay | Admitting: Physical Therapy

## 2023-05-14 ENCOUNTER — Other Ambulatory Visit: Payer: Self-pay

## 2023-05-14 ENCOUNTER — Ambulatory Visit: Payer: PPO | Attending: Internal Medicine | Admitting: Physical Therapy

## 2023-05-14 DIAGNOSIS — R2689 Other abnormalities of gait and mobility: Secondary | ICD-10-CM | POA: Insufficient documentation

## 2023-05-14 DIAGNOSIS — K219 Gastro-esophageal reflux disease without esophagitis: Secondary | ICD-10-CM | POA: Diagnosis not present

## 2023-05-14 DIAGNOSIS — M8580 Other specified disorders of bone density and structure, unspecified site: Secondary | ICD-10-CM | POA: Diagnosis not present

## 2023-05-14 DIAGNOSIS — R2681 Unsteadiness on feet: Secondary | ICD-10-CM | POA: Diagnosis not present

## 2023-05-21 ENCOUNTER — Ambulatory Visit: Payer: PPO

## 2023-05-21 DIAGNOSIS — R2689 Other abnormalities of gait and mobility: Secondary | ICD-10-CM

## 2023-05-21 DIAGNOSIS — R2681 Unsteadiness on feet: Secondary | ICD-10-CM | POA: Diagnosis not present

## 2023-05-21 NOTE — Therapy (Signed)
 OUTPATIENT PHYSICAL THERAPY VESTIBULAR TREATMENT     Patient Name: Madison [name] MRN: [id number] DOB:Jul 09, 1933, 89 y.o., female Today's Date: 05/21/2023  END OF SESSION:  PT End of Session - 05/21/23 0930     Visit Number 2    Number of Visits 7    Date for PT Re-Evaluation 06/25/23    Authorization Type [employer]    PT Start Time 0930    PT Stop Time 1015    PT Time Calculation (min) 45 min    Activity Tolerance Patient tolerated treatment well    Behavior During Therapy WFL for tasks assessed/performed             Past Medical History:  Diagnosis Date   Acquired absence of breast and nipple    Acute myocardial infarction, unspecified site, episode of care unspecified    1965 and 2015    ALLERGIC RHINITIS    Anginal pain (HCC)    Arthritis    Asthma    Blindness of left eye    decreased vision in left eye related to ocular occlusion    Breast cancer (HCC)    left mastectomy   Complication of anesthesia    Coronary atherosclerosis    Cough    Dysfunction of eustachian tube    Esophageal reflux    Glaucoma    Heart murmur    Hemorrhoids    Hyperlipidemia    Hypertension    Lymphedema    Pacemaker 2015   Dr Ladona Ridgel   Peripheral vascular disease (HCC)    Pneumonia    hx of walking pneumonia x 2    PONV (postoperative nausea and vomiting)    Presence of permanent cardiac pacemaker    PVC's (premature ventricular contractions)    Shortness of breath    Stress incontinence    Syncope 11/17/2013   Past Surgical History:  Procedure Laterality Date   ABDOMINAL HYSTERECTOMY  1967   CARDIAC CATHETERIZATION  03/01/1986   normal coronaries (Dr. Aram Candela)   CARDIAC CATHETERIZATION  10/25/2002   normal L main; LAD w/40% narrowing in prox 3rd and 60-70% narrowing beyond 1st diagonal, LAD was tortuous; dominant RCA with 30-40% segmental narrowing and 20-30% narrowing at junction of prox 3rd (Dr. Jonette Eva)   CARDIAC CATHETERIZATION  04/11/2006    trivial luminal irregularities in coronaries and mid LAD 50% (Dr. Aram Candela)   CARDIOPULMONARY MET TEST  05/05/2012   excellent effort w/RER 1.06, peak VO2>100%, peak HR 81%, good functional capacity   CAROTID DOPPLER  2005   normal study (ordered for swelling & pain, left neck clavicle to ear)   DILATION AND CURETTAGE OF UTERUS  1962-1968   x4   GALLBLADDER SURGERY  1985   MASTECTOMY Left 1981   MYOMECTOMY  1968   NM MYOCAR PERF WALL MOTION  2012   bruce myoview -no inducible ischemia, EF 71%, low risk scan   PERMANENT PACEMAKER INSERTION N/A 11/18/2013   Procedure: PERMANENT PACEMAKER INSERTION;  Surgeon: Marinus Maw, MD;  Location: [facility] CATH LAB;  Service: Cardiovascular;  Laterality: N/A;   PLACEMENT OF BREAST IMPLANTS  1993   [facility]   REMOVAL OF BILATERAL TISSUE EXPANDERS WITH PLACEMENT OF BILATERAL BREAST IMPLANTS     TOTAL HIP ARTHROPLASTY Right 08/02/2015   Procedure: RIGHT TOTAL HIP ARTHROPLASTY ANTERIOR APPROACH;  Surgeon: Durene Romans, MD;  Location: [facility] ORS;  Service: Orthopedics;  Laterality: Right;   TRANSTHORACIC ECHOCARDIOGRAM  2014   EF 55-60%, grade 1 diastolic dysfunction;  mildly thickened MV leaflets, trivial regurg    Patient Active Problem List   Diagnosis Date Noted   Primary open angle glaucoma (POAG) of both eyes, mild stage 07/11/2017   Upper airway cough syndrome 03/21/2017   Combined forms of age-related cataract of left eye 02/21/2017   Pseudophakia of right eye 02/21/2017   Bilateral impacted cerumen 12/12/2016   ETD (Eustachian tube dysfunction), bilateral 12/12/2016   Rhinitis, chronic 12/12/2016   S/P right THA, AA 08/02/2015   Sinus node dysfunction (HCC) 01/05/2015   Central retinal vein occlusion with macular edema of left eye 12/23/2014   Glaucoma suspect of both eyes 12/23/2014   Headache 10/28/2014   Overweight (BMI 25.0-29.9) 10/28/2014   Subjective visual disturbance, left eye 10/28/2014   Glaucoma 10/28/2014   Earache on left 06/23/2014    Colon cancer high risk 05/07/2014   Irritable bowel syndrome without diarrhea 05/07/2014   CAD in native artery 12/23/2013   Pacemaker 12/23/2013   CHB with near syncope 12/10/2013   Cardiac pacemaker in situ- MDT 11/18/13 12/10/2013   Near syncope 11/17/2013   Sinus pause 11/17/2013   Old MI (myocardial infarction) 11/17/2013   HTN (hypertension) 11/13/2013   Dyslipidemia 11/13/2013   PVC's (premature ventricular contractions) 11/13/2013   Acute myocardial infarction (HCC) 05/20/2008   EUSTACHIAN TUBE DYSFUNCTION 01/11/2007   CORONARY ARTERY DISEASE 01/11/2007   ALLERGIC RHINITIS 01/11/2007   Asthmatic bronchitis with exacerbation 01/11/2007   GERD 01/11/2007   MASTECTOMY, LEFT, HX OF 01/11/2007    PCP: Rodrigo Ran, MD  REFERRING PROVIDER: Kathaleen Maser, NP  REFERRING DIAG: R26.89 (ICD-10-CM) - Imbalance R42 (ICD-10-CM) - Dizziness  THERAPY DIAG:  Unsteadiness on feet  Other abnormalities of gait and mobility  ONSET DATE: 6-8 months  Rationale for Evaluation and Treatment: Rehabilitation  SUBJECTIVE:   SUBJECTIVE STATEMENT: Doing chair yoga and balance classes at [facility]. Head feels like in a fog.    Pt accompanied by: self  PERTINENT HISTORY: MI x2, breast CA c/p L mastectomy, lymphedema, L eye blindness, HLD, HTN, pacemaker, PVD, PVC's. R THA  PAIN:  Are you having pain? No  PRECAUTIONS: ICD/Pacemaker and Other: lymphedema  RED FLAGS: None   WEIGHT BEARING RESTRICTIONS: No  FALLS: Has patient fallen in last 6 months? Yes. Number of falls 1  LIVING ENVIRONMENT: Lives with: lives alone  Lives in: Other in an independent living facility "[facility]" Stairs: No Has following equipment at home: Single point cane, Environmental consultant - 2 wheeled, Wheelchair (manual), shower chair, and Grab bars  PLOF: Independent; gets housekeeping 2x/month  PATIENT GOALS: "figure out what is going on"  OBJECTIVE:   TODAY'S TREATMENT: 05/21/23 Activity Comments  M-CTSIB    Dynamic Gait Index 14/24  5xSTS 10.72 sec  10 meter walk: 21.37 sec = 1.5 ft/sec = 1.02 mph   HEP initiated  Attempted static tandem but unable to maintain, removed from program for now        M-CTSIB  Condition 1: Firm Surface, EO 30 Sec, Normal Sway  Condition 2: Firm Surface, EC 30 Sec, Normal Sway  Condition 3: Foam Surface, EO 30 Sec, Moderate Sway  Condition 4: Foam Surface, EC 10 Sec, Moderate and Severe Sway      [facility] PT Assessment - 05/21/23 0001       Standardized Balance Assessment   Standardized Balance Assessment Dynamic Gait Index;Five Times Sit to Stand    Five times sit to stand comments  10.72      Dynamic Gait Index   Level  Surface Mild Impairment    Change in Gait Speed Normal    Gait with Horizontal Head Turns Moderate Impairment    Gait with Vertical Head Turns Mild Impairment    Gait and Pivot Turn Moderate Impairment    Step Over Obstacle Moderate Impairment    Step Around Obstacles Mild Impairment    Steps Mild Impairment    Total Score 14             PATIENT EDUCATION: Education details: edu on exam findings, prognosis, POC, discussed possibility of trying PT to address balance concerns; advised to f/u with her PCP about pacemaker and cognitive concerns  Person educated: Patient Education method: Explanation Education comprehension: verbalized understanding  HOME EXERCISE PROGRAM: Access Code: [email or web address] URL: [email or web address] Date: 05/21/2023 Prepared by: Shary Decamp  Exercises - Corner Balance Feet Together With Eyes Open  - 1 x daily - 7 x weekly - 3 sets - 30 sec hold - Corner Balance Feet Together With Eyes Closed  - 1 x daily - 7 x weekly - 3 sets - 30 sec hold - Corner Balance Feet Together: Eyes Open With Head Turns  - 1 x daily - 7 x weekly - 3 sets - 3 reps - Corner Balance Feet Together: Eyes Closed With Head Turns  - 1 x daily - 7 x weekly - 3 sets - 3 reps Note: Objective measures were completed at  Evaluation unless otherwise noted.  DIAGNOSTIC FINDINGS: 10/04/21 head/neck CT angio: Head CT: No acute finding. Age related volume loss. Mild chronic small-vessel ischemic change of the white matter.CT angiography of the head and neck: Aortic atherosclerosis, mild. No carotid bifurcation disease. Anterior and posterior intracranial circulation is normal. No evidence of vessel occlusion or proximal stenosis.  COGNITION: Overall cognitive status: Within functional limits for tasks assessed   SENSATION: Reports some "prickly/burning" in feet occasionally  POSTURE:  Tremor in head/neck (reports ongoing for 30 years)  GAIT: Gait pattern:  cane slightly too high; slight forward lean, slightly unsteady  Assistive device utilized: Single point cane Level of assistance: Modified independence   FUNCTIONAL TESTS:  Tandem walk: requires min A   PATIENT SURVEYS:  DHI 40/100 * did not use this as goal as patient later denied dizziness  VESTIBULAR ASSESSMENT:  GENERAL OBSERVATION: pt wears readers  OCULOMOTOR EXAM:  Ocular Alignment:  L ptosis   (pt reports hx of stroke in L eye)  Ocular ROM: No Limitations  Spontaneous Nystagmus: absent  Gaze-Induced Nystagmus: absent  Smooth Pursuits: saccades few in vertical direction  Saccades: intact  Convergence/Divergence: no c/o diplopia during testing  VESTIBULAR - OCULAR REFLEX:   Slow VOR: Normal; c/o "crunching" in neck  VOR Cancellation: Normal; visible saccadic instructions at end point  Head-Impulse Test: HIT Right: negative HIT Left: negative     POSITIONAL TESTING:  Right Roll Test: negative  Left Roll Test: negative  Right Dix-Hallpike: negative  Left Dix-Hallpike: negative  TREATMENT DATE: 05/14/23         GOALS: Goals reviewed with patient? Yes  SHORT TERM GOALS: Target date:  06/04/2023  Patient to be independent with initial HEP. Baseline: HEP initiated Goal status: INITIAL    LONG TERM GOALS: Target date: 06/25/2023  Patient to be independent with advanced HEP. Baseline: Not yet initiated  Goal status: INITIAL  Patient to demonstrate mild-moderate sway for 30 seconds on MCTSIB condition 4 to improve stability in dim lighting and uneven surfaces.  Baseline: mod-severe x 10 sec Goal status: INITIAL  Patient to score at least 20/24 on DGI in order to decrease risk of falls.  Baseline: 14/24 Goal status: INITIAL  Patient to return to modified community exercise program with no safety concerns. Baseline: has stopped exercise classes Goal status: INITIAL  Patient to demonstrate 5xSTS test in <15 sec in order to decrease risk of falls.  Baseline: 10 sec Goal status: MET      ASSESSMENT:  CLINICAL IMPRESSION:  Initiated session with balance and fall risk assessments with increased deficits under conditions 2 and 4 M-CTSIB with increased sway and LOB under condtion 4. Dynamic Gait Index score indicating increased risk for falls with deficits most prominent with walking and head turns as well as stepping over obstacles and tendency for wide BOS to compensate.  Initiated HEP activities with corner balance activities to improve static balance and provide challenge to multisensory conditions to hopefully generalize these challenges to her mobility for greatest impact.  Attempted static tandem balance in corner but unable to safely maintain and this was removed from program. Continued sessions to advance POC details to improve mobility and reduce risk for falls  OBJECTIVE IMPAIRMENTS: Abnormal gait, decreased activity tolerance, and decreased balance.   ACTIVITY LIMITATIONS: carrying, lifting, bending, standing, squatting, stairs, transfers, bathing, toileting, dressing, reach over head, hygiene/grooming, and locomotion level  PARTICIPATION LIMITATIONS: meal  prep, cleaning, laundry, shopping, community activity, and church  PERSONAL FACTORS: Age, Past/current experiences, Time since onset of injury/illness/exacerbation, and 3+ comorbidities: MI x2, breast CA c/p L mastectomy, lymphedema, L eye blindness, HLD, HTN, pacemaker, PVD, PVC's. R THA  are also affecting patient's functional outcome.   REHAB POTENTIAL: Good  CLINICAL DECISION MAKING: Evolving/moderate complexity  EVALUATION COMPLEXITY: Moderate   PLAN:  PT FREQUENCY: 1x/week  PT DURATION: 6 weeks  PLANNED INTERVENTIONS: 97164- PT Re-evaluation, 97110-Therapeutic exercises, 97530- Therapeutic activity, 97112- Neuromuscular re-education, 97535- Self Care, 84132- Manual therapy, 97116- Gait training, [phone]- Canalith repositioning, Patient/Family education, Balance training, Stair training, Taping, Dry Needling, Vestibular training, Cryotherapy, and Moist heat  PLAN FOR NEXT SESSION: HEP review, progress to foam pad as able, talk about exercise classes to safely participate in     10:34 AM, 05/21/23 M. Shary Decamp, PT, DPT Physical Therapist- [employer] Office Number: [phone]

## 2023-05-24 ENCOUNTER — Other Ambulatory Visit: Payer: Self-pay

## 2023-05-24 DIAGNOSIS — R251 Tremor, unspecified: Secondary | ICD-10-CM

## 2023-05-24 DIAGNOSIS — R42 Dizziness and giddiness: Secondary | ICD-10-CM

## 2023-05-24 NOTE — Progress Notes (Signed)
 Per Dr. Tenny Craw, send referral for pt to Dr Tat Corinda [facility]) for tremor, dizziness. Evaluate for possible Parkinson's. Placed referral for Dr. Arbutus Leas.

## 2023-05-27 ENCOUNTER — Telehealth: Payer: Self-pay

## 2023-05-27 NOTE — Telephone Encounter (Signed)
 Left message for patient to call back.  Received message-   Cordero, Danna W  Pate Ingalls, Annelisa Ryback, RN Hello  This pt is est with [facility]. Does she not want to return there?  Thanks [employer]

## 2023-05-28 ENCOUNTER — Encounter: Payer: Self-pay | Admitting: Podiatry

## 2023-05-28 ENCOUNTER — Ambulatory Visit (INDEPENDENT_AMBULATORY_CARE_PROVIDER_SITE_OTHER): Payer: PPO | Admitting: Podiatry

## 2023-05-28 ENCOUNTER — Ambulatory Visit: Payer: PPO | Attending: Internal Medicine

## 2023-05-28 DIAGNOSIS — R2681 Unsteadiness on feet: Secondary | ICD-10-CM | POA: Diagnosis not present

## 2023-05-28 DIAGNOSIS — R2689 Other abnormalities of gait and mobility: Secondary | ICD-10-CM | POA: Diagnosis not present

## 2023-05-28 DIAGNOSIS — M79674 Pain in right toe(s): Secondary | ICD-10-CM

## 2023-05-28 DIAGNOSIS — M79675 Pain in left toe(s): Secondary | ICD-10-CM

## 2023-05-28 DIAGNOSIS — B351 Tinea unguium: Secondary | ICD-10-CM

## 2023-05-28 NOTE — Therapy (Signed)
 OUTPATIENT PHYSICAL THERAPY VESTIBULAR TREATMENT     Patient Name: Madison [name] MRN: [id number] DOB:1933/12/17, 89 y.o., female Today's Date: 05/28/2023  END OF SESSION:  PT End of Session - 05/28/23 1314     Visit Number 3    Number of Visits 7    Date for PT Re-Evaluation 06/25/23    Authorization Type [employer]    PT Start Time 1315    PT Stop Time 1400    PT Time Calculation (min) 45 min    Activity Tolerance Patient tolerated treatment well    Behavior During Therapy WFL for tasks assessed/performed             Past Medical History:  Diagnosis Date   Acquired absence of breast and nipple    Acute myocardial infarction, unspecified site, episode of care unspecified    1965 and 2015    ALLERGIC RHINITIS    Anginal pain (HCC)    Arthritis    Asthma    Blindness of left eye    decreased vision in left eye related to ocular occlusion    Breast cancer (HCC)    left mastectomy   Complication of anesthesia    Coronary atherosclerosis    Cough    Dysfunction of eustachian tube    Esophageal reflux    Glaucoma    Heart murmur    Hemorrhoids    Hyperlipidemia    Hypertension    Lymphedema    Pacemaker 2015   Dr Ladona Ridgel   Peripheral vascular disease (HCC)    Pneumonia    hx of walking pneumonia x 2    PONV (postoperative nausea and vomiting)    Presence of permanent cardiac pacemaker    PVC's (premature ventricular contractions)    Shortness of breath    Stress incontinence    Syncope 11/17/2013   Past Surgical History:  Procedure Laterality Date   ABDOMINAL HYSTERECTOMY  1967   CARDIAC CATHETERIZATION  03/01/1986   normal coronaries (Dr. Aram Candela)   CARDIAC CATHETERIZATION  10/25/2002   normal L main; LAD w/40% narrowing in prox 3rd and 60-70% narrowing beyond 1st diagonal, LAD was tortuous; dominant RCA with 30-40% segmental narrowing and 20-30% narrowing at junction of prox 3rd (Dr. Jonette Eva)   CARDIAC CATHETERIZATION  04/11/2006    trivial luminal irregularities in coronaries and mid LAD 50% (Dr. Aram Candela)   CARDIOPULMONARY MET TEST  05/05/2012   excellent effort w/RER 1.06, peak VO2>100%, peak HR 81%, good functional capacity   CAROTID DOPPLER  2005   normal study (ordered for swelling & pain, left neck clavicle to ear)   DILATION AND CURETTAGE OF UTERUS  1962-1968   x4   GALLBLADDER SURGERY  1985   MASTECTOMY Left 1981   MYOMECTOMY  1968   NM MYOCAR PERF WALL MOTION  2012   bruce myoview -no inducible ischemia, EF 71%, low risk scan   PERMANENT PACEMAKER INSERTION N/A 11/18/2013   Procedure: PERMANENT PACEMAKER INSERTION;  Surgeon: Marinus Maw, MD;  Location: [facility];  Service: Cardiovascular;  Laterality: N/A;   PLACEMENT OF BREAST IMPLANTS  1993   [facility]   REMOVAL OF BILATERAL TISSUE EXPANDERS WITH PLACEMENT OF BILATERAL BREAST IMPLANTS     TOTAL HIP ARTHROPLASTY Right 08/02/2015   Procedure: RIGHT TOTAL HIP ARTHROPLASTY ANTERIOR APPROACH;  Surgeon: Durene Romans, MD;  Location: [facility] ORS;  Service: Orthopedics;  Laterality: Right;   TRANSTHORACIC ECHOCARDIOGRAM  2014   EF 55-60%, grade 1 diastolic dysfunction;  mildly thickened MV leaflets, trivial regurg    Patient Active Problem List   Diagnosis Date Noted   Primary open angle glaucoma (POAG) of both eyes, mild stage 07/11/2017   Upper airway cough syndrome 03/21/2017   Combined forms of age-related cataract of left eye 02/21/2017   Pseudophakia of right eye 02/21/2017   Bilateral impacted cerumen 12/12/2016   ETD (Eustachian tube dysfunction), bilateral 12/12/2016   Rhinitis, chronic 12/12/2016   S/P right THA, AA 08/02/2015   Sinus node dysfunction (HCC) 01/05/2015   Central retinal vein occlusion with macular edema of left eye 12/23/2014   Glaucoma suspect of both eyes 12/23/2014   Headache 10/28/2014   Overweight (BMI 25.0-29.9) 10/28/2014   Subjective visual disturbance, left eye 10/28/2014   Glaucoma 10/28/2014   Earache on left 06/23/2014    Colon cancer high risk 05/07/2014   Irritable bowel syndrome without diarrhea 05/07/2014   CAD in native artery 12/23/2013   Pacemaker 12/23/2013   CHB with near syncope 12/10/2013   Cardiac pacemaker in situ- MDT 11/18/13 12/10/2013   Near syncope 11/17/2013   Sinus pause 11/17/2013   Old MI (myocardial infarction) 11/17/2013   HTN (hypertension) 11/13/2013   Dyslipidemia 11/13/2013   PVC's (premature ventricular contractions) 11/13/2013   Acute myocardial infarction (HCC) 05/20/2008   EUSTACHIAN TUBE DYSFUNCTION 01/11/2007   CORONARY ARTERY DISEASE 01/11/2007   ALLERGIC RHINITIS 01/11/2007   Asthmatic bronchitis with exacerbation 01/11/2007   GERD 01/11/2007   MASTECTOMY, LEFT, HX OF 01/11/2007    PCP: Rodrigo Ran, MD  REFERRING PROVIDER: Kathaleen Maser, NP  REFERRING DIAG: R26.89 (ICD-10-CM) - Imbalance R42 (ICD-10-CM) - Dizziness  THERAPY DIAG:  Unsteadiness on feet  Other abnormalities of gait and mobility  ONSET DATE: 6-8 months  Rationale for Evaluation and Treatment: Rehabilitation  SUBJECTIVE:   SUBJECTIVE STATEMENT: Noting when performing balance with eyes closed and head movements becomes unbalance/"fuzzy"   Pt accompanied by: self  PERTINENT HISTORY: MI x2, breast CA c/p L mastectomy, lymphedema, L eye blindness, HLD, HTN, pacemaker, PVD, PVC's. R THA  PAIN:  Are you having pain? No  PRECAUTIONS: ICD/Pacemaker and Other: lymphedema  RED FLAGS: None   WEIGHT BEARING RESTRICTIONS: No  FALLS: Has patient fallen in last 6 months? Yes. Number of falls 1  LIVING ENVIRONMENT: Lives with: lives alone  Lives in: Other in an independent living facility "[facility]" Stairs: No Has following equipment at home: Single point cane, Environmental consultant - 2 wheeled, Wheelchair (manual), shower chair, and Grab bars  PLOF: Independent; gets housekeeping 2x/month  PATIENT GOALS: "figure out what is going on"  OBJECTIVE:   TODAY'S TREATMENT: 05/28/23 Activity Comments   HEP review   Multisensory static balance   Dynamic balance Use of 5# ankle weights  Standing on foam against mild-moderate postural perturbations           TODAY'S TREATMENT: 05/21/23 Activity Comments  M-CTSIB   Dynamic Gait Index 14/24  5xSTS 10.72 sec  10 meter walk: 21.37 sec = 1.5 ft/sec = 1.02 mph   HEP initiated  Attempted static tandem but unable to maintain, removed from program for now       PATIENT EDUCATION: Education details: edu on exam findings, prognosis, POC, discussed possibility of trying PT to address balance concerns; advised to f/u with her PCP about pacemaker and cognitive concerns  Person educated: Patient Education method: Explanation Education comprehension: verbalized understanding  HOME EXERCISE PROGRAM: Access Code: [email or web address] URL: [email or web address] Date: 05/21/2023 Prepared by: Shary Decamp  Exercises - Corner Balance  Feet Together With Eyes Open  - 1 x daily - 7 x weekly - 3 sets - 30 sec hold - Corner Balance Feet Together With Eyes Closed  - 1 x daily - 7 x weekly - 3 sets - 30 sec hold - Corner Balance Feet Together: Eyes Open With Head Turns  - 1 x daily - 7 x weekly - 3 sets - 3 reps - Corner Balance Feet Together: Eyes Closed With Head Turns  - 1 x daily - 7 x weekly - 3 sets - 3 reps - Tandem Stance in Corner  - 1 x daily - 7 x weekly - 3 sets - 15-30 sec hold  M-CTSIB  Condition 1: Firm Surface, EO 30 Sec, Normal Sway  Condition 2: Firm Surface, EC 30 Sec, Normal Sway  Condition 3: Foam Surface, EO 30 Sec, Moderate Sway  Condition 4: Foam Surface, EC 10 Sec, Moderate and Severe Sway         Note: Objective measures were completed at Evaluation unless otherwise noted.  DIAGNOSTIC FINDINGS: 10/04/21 head/neck CT angio: Head CT: No acute finding. Age related volume loss. Mild chronic small-vessel ischemic change of the white matter.CT angiography of the head and neck: Aortic atherosclerosis, mild. No carotid  bifurcation disease. Anterior and posterior intracranial circulation is normal. No evidence of vessel occlusion or proximal stenosis.  COGNITION: Overall cognitive status: Within functional limits for tasks assessed   SENSATION: Reports some "prickly/burning" in feet occasionally  POSTURE:  Tremor in head/neck (reports ongoing for 30 years)  GAIT: Gait pattern:  cane slightly too high; slight forward lean, slightly unsteady  Assistive device utilized: Single point cane Level of assistance: Modified independence   FUNCTIONAL TESTS:  Tandem walk: requires min A   PATIENT SURVEYS:  DHI 40/100 * did not use this as goal as patient later denied dizziness  VESTIBULAR ASSESSMENT:  GENERAL OBSERVATION: pt wears readers  OCULOMOTOR EXAM:  Ocular Alignment:  L ptosis   (pt reports hx of stroke in L eye)  Ocular ROM: No Limitations  Spontaneous Nystagmus: absent  Gaze-Induced Nystagmus: absent  Smooth Pursuits: saccades few in vertical direction  Saccades: intact  Convergence/Divergence: no c/o diplopia during testing  VESTIBULAR - OCULAR REFLEX:   Slow VOR: Normal; c/o "crunching" in neck  VOR Cancellation: Normal; visible saccadic instructions at end point  Head-Impulse Test: HIT Right: negative HIT Left: negative     POSITIONAL TESTING:  Right Roll Test: negative  Left Roll Test: negative  Right Dix-Hallpike: negative  Left Dix-Hallpike: negative                                                                                                                            TREATMENT DATE: 05/14/23         GOALS: Goals reviewed with patient? Yes  SHORT TERM GOALS: Target date: 06/04/2023  Patient to be independent with initial HEP. Baseline: HEP initiated Goal status: INITIAL    LONG TERM  GOALS: Target date: 06/25/2023  Patient to be independent with advanced HEP. Baseline: Not yet initiated  Goal status: INITIAL  Patient to demonstrate mild-moderate sway  for 30 seconds on MCTSIB condition 4 to improve stability in dim lighting and uneven surfaces.  Baseline: mod-severe x 10 sec Goal status: INITIAL  Patient to score at least 20/24 on DGI in order to decrease risk of falls.  Baseline: 14/24 Goal status: INITIAL  Patient to return to modified community exercise program with no safety concerns. Baseline: has stopped exercise classes Goal status: INITIAL  Patient to demonstrate 5xSTS test in <15 sec in order to decrease risk of falls.  Baseline: 10 sec Goal status: MET      ASSESSMENT:  CLINICAL IMPRESSION: No new issues reported and demonstrates excellent HEP recall with good stability noted throughout. Instructed in static multisensory balance activities to improve proprioceptive/postural awareness with increased difficulty on compliant surfaces w/ tendency for right lateral LOB.  Continued with dynamic balance activities to promote single limb support and large amplitude movements with good tolerance and stability throughout. Greatest difficulty with single limb support and advancing LE to high surface w/ need for UE support.  Ended session with standing on foam and withstanding mild-moderate postural perturbations to facilitate righting reactions.  Continued sessions to progress POC details to improve mobility and reduce risk for falls  OBJECTIVE IMPAIRMENTS: Abnormal gait, decreased activity tolerance, and decreased balance.   ACTIVITY LIMITATIONS: carrying, lifting, bending, standing, squatting, stairs, transfers, bathing, toileting, dressing, reach over head, hygiene/grooming, and locomotion level  PARTICIPATION LIMITATIONS: meal prep, cleaning, laundry, shopping, community activity, and church  PERSONAL FACTORS: Age, Past/current experiences, Time since onset of injury/illness/exacerbation, and 3+ comorbidities: MI x2, breast CA c/p L mastectomy, lymphedema, L eye blindness, HLD, HTN, pacemaker, PVD, PVC's. R THA  are also  affecting patient's functional outcome.   REHAB POTENTIAL: Good  CLINICAL DECISION MAKING: Evolving/moderate complexity  EVALUATION COMPLEXITY: Moderate   PLAN:  PT FREQUENCY: 1x/week  PT DURATION: 6 weeks  PLANNED INTERVENTIONS: [id number]- PT Re-evaluation, 97110-Therapeutic exercises, 97530- Therapeutic activity, 97112- Neuromuscular re-education, 97535- Self Care, [id number]- Manual therapy, [id number]- Gait training, [phone]- Canalith repositioning, Patient/Family education, Balance training, Stair training, Taping, Dry Needling, Vestibular training, Cryotherapy, and Moist heat  PLAN FOR NEXT SESSION: HEP review, progress to foam pad as able, talk about exercise classes to safely participate in     1:14 PM, 05/28/23 M. Shary Decamp, PT, DPT Physical Therapist- [place] Office Number: [phone]

## 2023-05-28 NOTE — Progress Notes (Signed)
  Subjective:  Patient ID: Madison [name], female    DOB: 08-23-1933,  MRN: [id number]  Chief Complaint  Patient presents with   Diabetes    [facility]    89 y.o. female presents with the above complaint. History confirmed with patient.  Still using the Penlac.  Feels like this is helping.  Debridements have been helpful in reducing pain and improving function.  She was recently diagnosed with Parkinson's disease  Objective:  Physical Exam: warm, good capillary refill, no trophic changes or ulcerative lesions, normal DP and PT pulses, and abnormal sensory exam. Left Foot: dystrophic yellowed discolored nail plates with subungual debris Right Foot: dystrophic yellowed discolored nail plates with subungual debris   Assessment:   1. Pain due to onychomycosis of toenails of both feet      Plan:  Patient was evaluated and treated and all questions answered.  Discussed the etiology and treatment options for the condition in detail with the patient. Educated patient on the topical and oral treatment options for mycotic nails. Recommended debridement of the nails today. Sharp and mechanical debridement performed of all painful and mycotic nails today. Nails debrided in length and thickness using a nail nipper to level of comfort. Discussed treatment options including appropriate shoe gear. Follow up as needed for painful nails.  Continue treatment with Penlac.    Return in about 3 months (around 08/28/2023) for painful thick fungal nails.

## 2023-06-04 ENCOUNTER — Ambulatory Visit: Payer: PPO

## 2023-06-04 DIAGNOSIS — R2681 Unsteadiness on feet: Secondary | ICD-10-CM

## 2023-06-04 DIAGNOSIS — R2689 Other abnormalities of gait and mobility: Secondary | ICD-10-CM

## 2023-06-04 NOTE — Therapy (Signed)
 OUTPATIENT PHYSICAL THERAPY VESTIBULAR TREATMENT     Patient Name: Madison [name] MRN: [id number] DOB:June 19, 1933, 89 y.o., female Today's Date: 06/04/2023  END OF SESSION:  PT End of Session - 06/04/23 1008     Visit Number 4    Number of Visits 7    Date for PT Re-Evaluation 06/25/23    Authorization Type [employer]    PT Start Time 1010    PT Stop Time 1055    PT Time Calculation (min) 45 min    Activity Tolerance Patient tolerated treatment well    Behavior During Therapy WFL for tasks assessed/performed             Past Medical History:  Diagnosis Date   Acquired absence of breast and nipple    Acute myocardial infarction, unspecified site, episode of care unspecified    1965 and 2015    ALLERGIC RHINITIS    Anginal pain (HCC)    Arthritis    Asthma    Blindness of left eye    decreased vision in left eye related to ocular occlusion    Breast cancer (HCC)    left mastectomy   Complication of anesthesia    Coronary atherosclerosis    Cough    Dysfunction of eustachian tube    Esophageal reflux    Glaucoma    Heart murmur    Hemorrhoids    Hyperlipidemia    Hypertension    Lymphedema    Pacemaker 2015   Dr Ladona Ridgel   Peripheral vascular disease (HCC)    Pneumonia    hx of walking pneumonia x 2    PONV (postoperative nausea and vomiting)    Presence of permanent cardiac pacemaker    PVC's (premature ventricular contractions)    Shortness of breath    Stress incontinence    Syncope 11/17/2013   Past Surgical History:  Procedure Laterality Date   ABDOMINAL HYSTERECTOMY  1967   CARDIAC CATHETERIZATION  03/01/1986   normal coronaries (Dr. Aram Candela)   CARDIAC CATHETERIZATION  10/25/2002   normal L main; LAD w/40% narrowing in prox 3rd and 60-70% narrowing beyond 1st diagonal, LAD was tortuous; dominant RCA with 30-40% segmental narrowing and 20-30% narrowing at junction of prox 3rd (Dr. Jonette Eva)   CARDIAC CATHETERIZATION  04/11/2006    trivial luminal irregularities in coronaries and mid LAD 50% (Dr. Aram Candela)   CARDIOPULMONARY MET TEST  05/05/2012   excellent effort w/RER 1.06, peak VO2>100%, peak HR 81%, good functional capacity   CAROTID DOPPLER  2005   normal study (ordered for swelling & pain, left neck clavicle to ear)   DILATION AND CURETTAGE OF UTERUS  1962-1968   x4   GALLBLADDER SURGERY  1985   MASTECTOMY Left 1981   MYOMECTOMY  1968   NM MYOCAR PERF WALL MOTION  2012   bruce myoview -no inducible ischemia, EF 71%, low risk scan   PERMANENT PACEMAKER INSERTION N/A 11/18/2013   Procedure: PERMANENT PACEMAKER INSERTION;  Surgeon: Marinus Maw, MD;  Location: [facility] CATH [facility];  Service: Cardiovascular;  Laterality: N/A;   PLACEMENT OF BREAST IMPLANTS  1993   [facility]   REMOVAL OF BILATERAL TISSUE EXPANDERS WITH PLACEMENT OF BILATERAL BREAST IMPLANTS     TOTAL HIP ARTHROPLASTY Right 08/02/2015   Procedure: RIGHT TOTAL HIP ARTHROPLASTY ANTERIOR APPROACH;  Surgeon: Durene Romans, MD;  Location: [facility];  Service: Orthopedics;  Laterality: Right;   TRANSTHORACIC ECHOCARDIOGRAM  2014   EF 55-60%, grade 1 diastolic dysfunction;  mildly thickened MV leaflets, trivial regurg    Patient Active Problem List   Diagnosis Date Noted   Primary open angle glaucoma (POAG) of both eyes, mild stage 07/11/2017   Upper airway cough syndrome 03/21/2017   Combined forms of age-related cataract of left eye 02/21/2017   Pseudophakia of right eye 02/21/2017   Bilateral impacted cerumen 12/12/2016   ETD (Eustachian tube dysfunction), bilateral 12/12/2016   Rhinitis, chronic 12/12/2016   S/P right THA, AA 08/02/2015   Sinus node dysfunction (HCC) 01/05/2015   Central retinal vein occlusion with macular edema of left eye 12/23/2014   Glaucoma suspect of both eyes 12/23/2014   Headache 10/28/2014   Overweight (BMI 25.0-29.9) 10/28/2014   Subjective visual disturbance, left eye 10/28/2014   Glaucoma 10/28/2014   Earache on left 06/23/2014    Colon cancer high risk 05/07/2014   Irritable bowel syndrome without diarrhea 05/07/2014   CAD in native artery 12/23/2013   Pacemaker 12/23/2013   CHB with near syncope 12/10/2013   Cardiac pacemaker in situ- MDT 11/18/13 12/10/2013   Near syncope 11/17/2013   Sinus pause 11/17/2013   Old MI (myocardial infarction) 11/17/2013   HTN (hypertension) 11/13/2013   Dyslipidemia 11/13/2013   PVC's (premature ventricular contractions) 11/13/2013   Acute myocardial infarction (HCC) 05/20/2008   EUSTACHIAN TUBE DYSFUNCTION 01/11/2007   CORONARY ARTERY DISEASE 01/11/2007   ALLERGIC RHINITIS 01/11/2007   Asthmatic bronchitis with exacerbation 01/11/2007   GERD 01/11/2007   MASTECTOMY, LEFT, HX OF 01/11/2007    PCP: Rodrigo Ran, MD  REFERRING PROVIDER: Kathaleen Maser, NP  REFERRING DIAG: R26.89 (ICD-10-CM) - Imbalance R42 (ICD-10-CM) - Dizziness  THERAPY DIAG:  Unsteadiness on feet  Other abnormalities of gait and mobility  ONSET DATE: 6-8 months  Rationale for Evaluation and Treatment: Rehabilitation  SUBJECTIVE:   SUBJECTIVE STATEMENT: Doing ok. Right knee is a little swollen today.   Pt accompanied by: self  PERTINENT HISTORY: MI x2, breast CA c/p L mastectomy, lymphedema, L eye blindness, HLD, HTN, pacemaker, PVD, PVC's. R THA  PAIN:  Are you having pain? No  PRECAUTIONS: ICD/Pacemaker and Other: lymphedema  RED FLAGS: None   WEIGHT BEARING RESTRICTIONS: No  FALLS: Has patient fallen in last 6 months? Yes. Number of falls 1  LIVING ENVIRONMENT: Lives with: lives alone  Lives in: Other in an independent living facility "[facility]" Stairs: No Has following equipment at home: Single point cane, Environmental consultant - 2 wheeled, Wheelchair (manual), shower chair, and Grab bars  PLOF: Independent; gets housekeeping 2x/month  PATIENT GOALS: "figure out what is going on"  OBJECTIVE:   TODAY'S TREATMENT: 06/04/23 Activity Comments  retrowalking 3x45 ft  Resisted walking  2x2 min 10-15#  Corner balance On firm, foam  Dynamic balance In parallel bars for improving single limb support              PATIENT EDUCATION: Education details: edu on exam findings, prognosis, POC, discussed possibility of trying PT to address balance concerns; advised to f/u with her PCP about pacemaker and cognitive concerns  Person educated: Patient Education method: Explanation Education comprehension: verbalized understanding  HOME EXERCISE PROGRAM: Access Code: [email or web address] URL: [email or web address] Date: 05/21/2023 Prepared by: Shary Decamp  Exercises - Corner Balance Feet Together With Eyes Open  - 1 x daily - 7 x weekly - 3 sets - 30 sec hold - Corner Balance Feet Together With Eyes Closed  - 1 x daily - 7 x weekly - 3 sets - 30 sec hold - Corner Balance Feet Together:  Eyes Open With Head Turns  - 1 x daily - 7 x weekly - 3 sets - 3 reps - Corner Balance Feet Together: Eyes Closed With Head Turns  - 1 x daily - 7 x weekly - 3 sets - 3 reps - Tandem Stance in Corner  - 1 x daily - 7 x weekly - 3 sets - 15-30 sec hold  M-CTSIB  Condition 1: Firm Surface, EO 30 Sec, Normal Sway  Condition 2: Firm Surface, EC 30 Sec, Normal Sway  Condition 3: Foam Surface, EO 30 Sec, Moderate Sway  Condition 4: Foam Surface, EC 10 Sec, Moderate and Severe Sway         Note: Objective measures were completed at Evaluation unless otherwise noted.  DIAGNOSTIC FINDINGS: 10/04/21 head/neck CT angio: Head CT: No acute finding. Age related volume loss. Mild chronic small-vessel ischemic change of the white matter.CT angiography of the head and neck: Aortic atherosclerosis, mild. No carotid bifurcation disease. Anterior and posterior intracranial circulation is normal. No evidence of vessel occlusion or proximal stenosis.  COGNITION: Overall cognitive status: Within functional limits for tasks assessed   SENSATION: Reports some "prickly/burning" in feet  occasionally  POSTURE:  Tremor in head/neck (reports ongoing for 30 years)  GAIT: Gait pattern:  cane slightly too high; slight forward lean, slightly unsteady  Assistive device utilized: Single point cane Level of assistance: Modified independence   FUNCTIONAL TESTS:  Tandem walk: requires min A   PATIENT SURVEYS:  DHI 40/100 * did not use this as goal as patient later denied dizziness  VESTIBULAR ASSESSMENT:  GENERAL OBSERVATION: pt wears readers  OCULOMOTOR EXAM:  Ocular Alignment:  L ptosis   (pt reports hx of stroke in L eye)  Ocular ROM: No Limitations  Spontaneous Nystagmus: absent  Gaze-Induced Nystagmus: absent  Smooth Pursuits: saccades few in vertical direction  Saccades: intact  Convergence/Divergence: no c/o diplopia during testing  VESTIBULAR - OCULAR REFLEX:   Slow VOR: Normal; c/o "crunching" in neck  VOR Cancellation: Normal; visible saccadic instructions at end point  Head-Impulse Test: HIT Right: negative HIT Left: negative     POSITIONAL TESTING:  Right Roll Test: negative  Left Roll Test: negative  Right Dix-Hallpike: negative  Left Dix-Hallpike: negative                                                                                                                            TREATMENT DATE: 05/14/23         GOALS: Goals reviewed with patient? Yes  SHORT TERM GOALS: Target date: 06/04/2023  Patient to be independent with initial HEP. Baseline: HEP initiated Goal status: MET    LONG TERM GOALS: Target date: 06/25/2023  Patient to be independent with advanced HEP. Baseline: Not yet initiated  Goal status: INITIAL  Patient to demonstrate mild-moderate sway for 30 seconds on MCTSIB condition 4 to improve stability in dim lighting and uneven surfaces.  Baseline: mod-severe x 10 sec Goal status:  INITIAL  Patient to score at least 20/24 on DGI in order to decrease risk of falls.  Baseline: 14/24 Goal status: INITIAL  Patient to  return to modified community exercise program with no safety concerns. Baseline: has stopped exercise classes Goal status: INITIAL  Patient to demonstrate 5xSTS test in <15 sec in order to decrease risk of falls.  Baseline: 10 sec Goal status: MET      ASSESSMENT:  CLINICAL IMPRESSION: Demo good HEP recall and able to progress to compliant surfaces and provided reference for purchasing balance cushion for home use for progression to HEP.  Dynamic balance activities to improve single limb support and facilitate righting reactions.  Difficulty with rocker board demands with bias to ankles.  Able to meet 1/1 STG. Continued sessions to progress POC details to improve balance and reduce risk for falls.   OBJECTIVE IMPAIRMENTS: Abnormal gait, decreased activity tolerance, and decreased balance.   ACTIVITY LIMITATIONS: carrying, lifting, bending, standing, squatting, stairs, transfers, bathing, toileting, dressing, reach over head, hygiene/grooming, and locomotion level  PARTICIPATION LIMITATIONS: meal prep, cleaning, laundry, shopping, community activity, and church  PERSONAL FACTORS: Age, Past/current experiences, Time since onset of injury/illness/exacerbation, and 3+ comorbidities: MI x2, breast CA c/p L mastectomy, lymphedema, L eye blindness, HLD, HTN, pacemaker, PVD, PVC's. R THA  are also affecting patient's functional outcome.   REHAB POTENTIAL: Good  CLINICAL DECISION MAKING: Evolving/moderate complexity  EVALUATION COMPLEXITY: Moderate   PLAN:  PT FREQUENCY: 1x/week  PT DURATION: 6 weeks  PLANNED INTERVENTIONS: 97164- PT Re-evaluation, 97110-Therapeutic exercises, 97530- Therapeutic activity, 97112- Neuromuscular re-education, 97535- Self Care, 16109- Manual therapy, [id number]- Gait training, [phone]- Canalith repositioning, Patient/Family education, Balance training, Stair training, Taping, Dry Needling, Vestibular training, Cryotherapy, and Moist heat  PLAN FOR NEXT SESSION: ,  progress to foam pad as able, talk about exercise classes to safely participate in     10:08 AM, 06/04/23 M. Shary Decamp, PT, DPT Physical Therapist- [place] Office Number: [phone]

## 2023-06-11 ENCOUNTER — Ambulatory Visit: Payer: PPO

## 2023-06-13 ENCOUNTER — Telehealth: Payer: Self-pay

## 2023-06-13 NOTE — Telephone Encounter (Signed)
 Pt called in letting us know that she has to have her monitor replaced and she will receive it in 7-10 business days

## 2023-06-17 ENCOUNTER — Telehealth: Payer: Self-pay

## 2023-06-17 NOTE — Telephone Encounter (Signed)
 Attempted outreach to Pt.  No answer.  Left message requesting call back.

## 2023-06-18 ENCOUNTER — Ambulatory Visit: Payer: PPO

## 2023-06-18 ENCOUNTER — Ambulatory Visit (INDEPENDENT_AMBULATORY_CARE_PROVIDER_SITE_OTHER)

## 2023-06-18 DIAGNOSIS — I442 Atrioventricular block, complete: Secondary | ICD-10-CM

## 2023-06-19 LAB — CUP PACEART REMOTE DEVICE CHECK
Battery Impedance: 1595 Ohm
Battery Remaining Longevity: 43 mo
Battery Voltage: 2.76 V
Brady Statistic AP VP Percent: 0 %
Brady Statistic AP VS Percent: 99 %
Brady Statistic AS VP Percent: 0 %
Brady Statistic AS VS Percent: 0 %
Date Time Interrogation Session: 20250325080151
Implantable Lead Connection Status: 753985
Implantable Lead Connection Status: 753985
Implantable Lead Implant Date: 20150826
Implantable Lead Implant Date: 20150826
Implantable Lead Location: 753859
Implantable Lead Location: 753860
Implantable Lead Model: 5076
Implantable Lead Model: 5076
Implantable Pulse Generator Implant Date: 20150826
Lead Channel Impedance Value: 435 Ohm
Lead Channel Impedance Value: 627 Ohm
Lead Channel Pacing Threshold Amplitude: 0.5 V
Lead Channel Pacing Threshold Amplitude: 0.625 V
Lead Channel Pacing Threshold Pulse Width: 0.4 ms
Lead Channel Pacing Threshold Pulse Width: 0.4 ms
Lead Channel Setting Pacing Amplitude: 2 V
Lead Channel Setting Pacing Amplitude: 2.5 V
Lead Channel Setting Pacing Pulse Width: 0.4 ms
Lead Channel Setting Sensing Sensitivity: 4 mV
Zone Setting Status: 755011
Zone Setting Status: 755011

## 2023-06-20 ENCOUNTER — Encounter: Payer: Self-pay | Admitting: Internal Medicine

## 2023-06-25 ENCOUNTER — Encounter: Payer: PPO | Admitting: Physical Therapy

## 2023-06-26 NOTE — Telephone Encounter (Signed)
 Attempted outreach to Pt.  No answer.  Left message requesting call back.

## 2023-07-05 NOTE — Telephone Encounter (Signed)
 Sent mychart message requesting Pt call to schedule follow up visit with Dr. Ladona Ridgel.  Await further needs.

## 2023-08-02 NOTE — Progress Notes (Signed)
 Remote pacemaker transmission.

## 2023-08-02 NOTE — Addendum Note (Signed)
 Addended by: Lott Rouleau A on: 08/02/2023 11:53 AM   Modules accepted: Orders

## 2023-09-03 DIAGNOSIS — H401132 Primary open-angle glaucoma, bilateral, moderate stage: Secondary | ICD-10-CM | POA: Diagnosis not present

## 2023-09-09 ENCOUNTER — Ambulatory Visit: Admitting: Podiatry

## 2023-09-11 ENCOUNTER — Telehealth: Payer: Self-pay | Admitting: Internal Medicine

## 2023-09-11 DIAGNOSIS — Z79899 Other long term (current) drug therapy: Secondary | ICD-10-CM

## 2023-09-11 MED ORDER — ATENOLOL 25 MG PO TABS
25.0000 mg | ORAL_TABLET | Freq: Every evening | ORAL | 3 refills | Status: DC
Start: 2023-09-11 — End: 2023-09-12

## 2023-09-11 NOTE — Telephone Encounter (Signed)
*  STAT* If patient is at the pharmacy, call can be transferred to refill team.   1. Which medications need to be refilled? (please list name of each medication and dose if known) atenolol  (TENORMIN ) 25 MG tablet   2. Which pharmacy/location (including street and city if local pharmacy) is medication to be sent to? [facility] - [place], [place] - [address]   3. Do they need a 30 day or 90 day supply? 90 Pt states is taking 25mg  twice a day (12.5mg  in the afternoon prn)  Also wants updated med list sent over to pcp office

## 2023-09-11 NOTE — Telephone Encounter (Signed)
 Left voicemail to return call to office.  Rx sent to pharmacy

## 2023-09-12 ENCOUNTER — Telehealth: Payer: Self-pay | Admitting: Internal Medicine

## 2023-09-12 MED ORDER — ATENOLOL 25 MG PO TABS: 25.0000 mg | ORAL_TABLET | Freq: Three times a day (TID) | ORAL | 3 refills | Status: AC

## 2023-09-12 NOTE — Addendum Note (Signed)
 Addended by: Alanna Alley on: 09/12/2023 10:11 AM   Modules accepted: Orders

## 2023-09-12 NOTE — Telephone Encounter (Signed)
 Needs new Rx for atenolol   Take 25 mg by mouth 2x per day  With 1/2 tablet 12.5 mg if needed in mid afternoon  Write Rx as 25 tid  so she has extra  Fill for 3 months (270 tables)    [employer] out of [employer] Rx to Dr Genelle Kennedy

## 2023-09-12 NOTE — Telephone Encounter (Signed)
 Rx sent in for the pt and forwarded to Dr Genelle Kennedy.

## 2023-09-12 NOTE — Telephone Encounter (Signed)
 See other open encounters RE; the pts refills.

## 2023-09-12 NOTE — Telephone Encounter (Signed)
 Pt c/o medication issue:  1. Name of Medication: atenolol  (TENORMIN ) 25 MG tablet   2. How are you currently taking this medication (dosage and times per day)? N/A  3. Are you having a reaction (difficulty breathing--STAT)? No   4. What is your medication issue? Pt home heath pharmacist would like medication instructions. Pt states she's suppose to take this medication twice daily.

## 2023-09-17 ENCOUNTER — Ambulatory Visit (INDEPENDENT_AMBULATORY_CARE_PROVIDER_SITE_OTHER)

## 2023-09-17 DIAGNOSIS — I495 Sick sinus syndrome: Secondary | ICD-10-CM

## 2023-09-18 LAB — CUP PACEART REMOTE DEVICE CHECK
Battery Impedance: 1654 Ohm
Battery Remaining Longevity: 43 mo
Battery Voltage: 2.76 V
Brady Statistic AP VP Percent: 0 %
Brady Statistic AP VS Percent: 100 %
Brady Statistic AS VP Percent: 0 %
Brady Statistic AS VS Percent: 0 %
Date Time Interrogation Session: 20250625102632
Implantable Lead Connection Status: 753985
Implantable Lead Connection Status: 753985
Implantable Lead Implant Date: 20150826
Implantable Lead Implant Date: 20150826
Implantable Lead Location: 753859
Implantable Lead Location: 753860
Implantable Lead Model: 5076
Implantable Lead Model: 5076
Implantable Pulse Generator Implant Date: 20150826
Lead Channel Impedance Value: 466 Ohm
Lead Channel Impedance Value: 658 Ohm
Lead Channel Pacing Threshold Amplitude: 0.5 V
Lead Channel Pacing Threshold Amplitude: 0.5 V
Lead Channel Pacing Threshold Pulse Width: 0.4 ms
Lead Channel Pacing Threshold Pulse Width: 0.4 ms
Lead Channel Setting Pacing Amplitude: 2 V
Lead Channel Setting Pacing Amplitude: 2.5 V
Lead Channel Setting Pacing Pulse Width: 0.4 ms
Lead Channel Setting Sensing Sensitivity: 4 mV
Zone Setting Status: 755011
Zone Setting Status: 755011

## 2023-09-22 ENCOUNTER — Ambulatory Visit: Payer: Self-pay | Admitting: Internal Medicine

## 2023-10-02 ENCOUNTER — Telehealth: Payer: Self-pay | Admitting: Internal Medicine

## 2023-10-02 NOTE — Telephone Encounter (Signed)
 Patient added to Dr. Nada schedule Thursday October 03, 2023 at 12 pm.

## 2023-10-02 NOTE — Telephone Encounter (Signed)
 Please add patient to my office schedule for tomorrow   Appt at [facility] To come at 11:45   I have notified the pt already

## 2023-10-03 ENCOUNTER — Ambulatory Visit: Admitting: Internal Medicine

## 2023-10-03 ENCOUNTER — Ambulatory Visit: Attending: Internal Medicine | Admitting: Internal Medicine

## 2023-10-03 ENCOUNTER — Encounter: Payer: Self-pay | Admitting: Internal Medicine

## 2023-10-03 ENCOUNTER — Other Ambulatory Visit: Payer: Self-pay | Admitting: *Deleted

## 2023-10-03 VITALS — BP 100/64 | HR 66 | Ht 63.0 in | Wt 163.0 lb

## 2023-10-03 DIAGNOSIS — I495 Sick sinus syndrome: Secondary | ICD-10-CM

## 2023-10-03 DIAGNOSIS — E785 Hyperlipidemia, unspecified: Secondary | ICD-10-CM | POA: Diagnosis not present

## 2023-10-03 DIAGNOSIS — I2510 Atherosclerotic heart disease of native coronary artery without angina pectoris: Secondary | ICD-10-CM

## 2023-10-03 DIAGNOSIS — Z79899 Other long term (current) drug therapy: Secondary | ICD-10-CM | POA: Diagnosis not present

## 2023-10-03 DIAGNOSIS — R0602 Shortness of breath: Secondary | ICD-10-CM

## 2023-10-03 DIAGNOSIS — I442 Atrioventricular block, complete: Secondary | ICD-10-CM | POA: Diagnosis not present

## 2023-10-03 NOTE — Patient Instructions (Signed)
 Medication Instructions:  The current medical regimen is effective;  continue present plan and medications.  *If you need a refill on your cardiac medications before your next appointment, please call your pharmacy*  Lab Work: Please have blood work as soon as possible at your closest [facility].  If you have labs (blood work) drawn today and your tests are completely normal, you will receive your results only by: MyChart Message (if you have MyChart) OR A paper copy in the mail If you have any lab test that is abnormal or we need to change your treatment, we will call you to review the results.  Testing/Procedures: Your physician has requested that you have an echocardiogram. Echocardiography is a painless test that uses sound waves to create images of your heart. It provides your doctor with information about the size and shape of your heart and how well your heart's chambers and valves are working. This procedure takes approximately one hour. There are no restrictions for this procedure. Please do NOT wear cologne, perfume, aftershave, or lotions (deodorant is allowed). Please arrive 15 minutes prior to your appointment time.  Please note: We ask at that you not bring children with you during ultrasound (echo/ vascular) testing. Due to room size and safety concerns, children are not allowed in the ultrasound rooms during exams. Our front office staff cannot provide observation of children in our lobby area while testing is being conducted. An adult accompanying a patient to their appointment will only be allowed in the ultrasound room at the discretion of the ultrasound technician under special circumstances. We apologize for any inconvenience.   Follow-Up: At [facility], you and your health needs are our priority.  As part of our continuing mission to provide you with exceptional heart care, our providers are all part of one team.  This team includes your primary Cardiologist  (physician) and Advanced Practice Providers or APPs (Physician Assistants and Nurse Practitioners) who all work together to provide you with the care you need, when you need it.  Your next appointment:   1 year(s)  Provider:   Vina Gull, MD    We recommend signing up for the patient portal called MyChart.  Sign up information is provided on this After Visit Summary.  MyChart is used to connect with patients for Virtual Visits (Telemedicine).  Patients are able to view lab/test results, encounter notes, upcoming appointments, etc.  Non-urgent messages can be sent to your provider as well.   To learn more about what you can do with MyChart, go to [email or web address].

## 2023-10-03 NOTE — Progress Notes (Signed)
 Cardiology Office Note   Date:  10/03/2023   ID:  Madison, [name] 06-Oct-1933, MRN [id number]  PCP:  Shayne Anes, MD  Cardiologist:   Vina Gull, MD   F/U of HTN and CAD    History of Present Illness: Madison [name] is a 89 y.o. female with a history of mild to mod CAD  Myovue in 2015 was normal Echo showed normal LVEF Cardopulmonary stress test consistent with deconditioning.   The pt also has hx of HTN, PVCs,  sinus node dysfunction (s/p PPM in 2015), GERD, HL   Echo done in Dec 2023 showed  Normal LVEF and RVEF  Mld diastolic dysfunction  I saw the pt in Jan 2025   She complained of hearing blood beating in ear   Seen by ENT  Nothing found   I referred pt to neuro  I also recomm adjusting her meds some    The pt returns today for follow up    She still has complaint of heart blood flow in ear    She comes in today because she feels weak  [place] she was at [employer]  After [employer] class she became very weak, lightheaded   Denies being overheated    Says she has been drinking fluids    She also notes more fatigue   Can't do as much   On a lot of committees   May have to cut back Worried she has heart problems  B  Lightneaded   Denies CP    No palpitations  No syncope      Feels foggy in head      Something isn't right  WOuld like to go to [employer] neuro  Would lkie test done   Allergies:   Tape, Codeine, Latex, Other, Oxycodone-acetaminophen , Penicillins, Percodan [oxycodone-aspirin ], Pravastatin, Repatha  [evolocumab ], Vancomycin , and Epinephrine   Past Medical History:  Diagnosis Date   Acquired absence of breast and nipple    Acute myocardial infarction, unspecified site, episode of care unspecified    1965 and 2015    ALLERGIC RHINITIS    Anginal pain (HCC)    Arthritis    Asthma    Blindness of left eye    decreased vision in left eye related to ocular occlusion    Breast cancer (HCC)    left mastectomy   Complication of anesthesia    Coronary  atherosclerosis    Cough    Dysfunction of eustachian tube    Esophageal reflux    Glaucoma    Heart murmur    Hemorrhoids    Hyperlipidemia    Hypertension    Lymphedema    Pacemaker 2015   Dr Waddell   Peripheral vascular disease (HCC)    Pneumonia    hx of walking pneumonia x 2    PONV (postoperative nausea and vomiting)    Presence of permanent cardiac pacemaker    PVC's (premature ventricular contractions)    Shortness of breath    Stress incontinence    Syncope 11/17/2013    Past Surgical History:  Procedure Laterality Date   ABDOMINAL HYSTERECTOMY  1967   CARDIAC CATHETERIZATION  03/01/1986   normal coronaries (Dr. MICAEL Ona)   CARDIAC CATHETERIZATION  10/25/2002   normal L main; LAD w/40% narrowing in prox 3rd and 60-70% narrowing beyond 1st diagonal, LAD was tortuous; dominant RCA with 30-40% segmental narrowing and 20-30% narrowing at junction of prox 3rd (Dr. FABIENE Pinion)   CARDIAC CATHETERIZATION  04/11/2006   trivial luminal irregularities in coronaries and mid LAD 50% (Dr. MICAEL Ona)   CARDIOPULMONARY MET TEST  05/05/2012   excellent effort w/RER 1.06, peak VO2>100%, peak HR 81%, good functional capacity   CAROTID DOPPLER  2005   normal study (ordered for swelling & pain, left neck clavicle to ear)   DILATION AND CURETTAGE OF UTERUS  1962-1968   x4   GALLBLADDER SURGERY  1985   MASTECTOMY Left 1981   MYOMECTOMY  1968   NM MYOCAR PERF WALL MOTION  2012   bruce myoview -no inducible ischemia, EF 71%, low risk scan   PERMANENT PACEMAKER INSERTION N/A 11/18/2013   Procedure: PERMANENT PACEMAKER INSERTION;  Surgeon: Danelle LELON Birmingham, MD;  Location[facility] C[facility];  Service: Cardiovascular;  Laterality: N/A;   PLACEMENT OF BREAST IMPLANTS  1993   [facility]   REMOVAL OF BILATERAL TISSUE EXPANDERS WITH PLACEMENT OF BILATERAL BREAST IMPLANTS     TOTAL HIP ARTHROPLASTY Right 08/02/2015   Procedure: RIGHT TOTAL HIP ARTHROPLASTY ANTERIOR APPROACH;  Surgeon: Donnice Car, MD;   Location: [facility] ORS;  Service: Orthopedics;  Laterality: Right;   TRANSTHORACIC ECHOCARDIOGRAM  2014   EF 55-60%, grade 1 diastolic dysfunction; mildly thickened MV leaflets, trivial regurg      Social History:  The patient  reports that she has never smoked. She has never used smokeless tobacco. She reports current alcohol  use. She reports that she does not use drugs.   Family History:  The patient's family history includes CAD in her brother and brother; Cancer in her brother, maternal grandfather, mother, and sister; Cervical cancer in her sister; Colon cancer in her sister; Colon cancer (age of onset: 50) in her mother; Heart attack in her brother; Heart disease in her maternal grandfather, maternal grandmother, mother, and paternal grandmother; Liver cancer in her brother; Lymphoma in her sister; Stroke in her brother, brother, maternal grandmother, mother, and paternal grandfather.    ROS:  Please see the history of present illness. All other systems are reviewed and  Negative to the above problem except as noted.    PHYSICAL EXAM: VS:  BP 100/64   Pulse 66   Ht 5' 3 (1.6 m)   Wt 163 lb (73.9 kg)   SpO2 96%   BMI 28.87 kg/m    GEN: Pt is in NAD HEENT: normal  Neck: JVP is normal  No bruits  Cardiac:  RRR No murmur   No LE  edema  Respiratory:  clear to auscultation bilaterally  GI: soft, nontender   No hepatomegaly    EKG:  EKG is shows atrial paced 66 bpm  First degree AV block  PR 300 msec     Echo Jan 2024      1. Left ventricular ejection fraction, by estimation, is 60 to 65%. The  left ventricle has normal function. The left ventricle has no regional  wall motion abnormalities. Left ventricular diastolic parameters are  consistent with Grade I diastolic  dysfunction (impaired relaxation). The average left ventricular global  longitudinal strain is -20.7 %. The global longitudinal strain is normal.   2. Right ventricular systolic function is normal. The right  ventricular  size is normal. Tricuspid regurgitation signal is inadequate for assessing  PA pressure.   3. Right atrial size was mildly dilated.   4. The mitral valve is normal in structure. Trivial mitral valve  regurgitation. No evidence of mitral stenosis.   5. The aortic valve is normal in structure. Aortic valve regurgitation  is  not visualized. No aortic stenosis is present.   6. Aortic dilatation noted. There is mild dilatation of the ascending  aorta, measuring 38 mm.   7. The inferior vena cava is normal in size with greater than 50%  respiratory variability, suggesting right atrial pressure of 3 mmHg   Carotid USN  04/2020  Right Carotid: The extracranial vessels were near-normal with only minimal wall thickening or plaque. Left Carotid: There was no evidence of thrombus, dissection, atherosclerotic plaque or stenosis in the cervical carotid system. Incidental finding of bilateral thyroid  nodules, both are hyperechoic in echo texture. Right dominant nodule 2.4 x 1.5 x 1.4 cm with small calcification within. Left inferior pole nodule .9 x .8 x .7 cm. Dedicated thyroid  ultrasound is recommended if clinically warranted. *See table(s) above for measurements and observations. Electronically signed by Deatrice Cage MD on 05/07/2020 at 2:59:39 PM. Vertebrals: Bilateral vertebral arteries demonstrate antegrade flow. Subclavians: Normal flow hemodynamics were seen in bilateral subclavian arteries.  Lipid Panel    Component Value Date/Time   CHOL 176 10/28/2014 0440   TRIG 126 10/28/2014 0440   HDL 42 10/28/2014 0440   CHOLHDL 4.2 10/28/2014 0440   VLDL 25 10/28/2014 0440   LDLCALC 109 (H) 10/28/2014 0440      Wt Readings from Last 3 Encounters:  10/03/23 163 lb (73.9 kg)  04/15/23 161 lb (73 kg)  01/22/23 165 lb (74.8 kg)      ASSESSMENT AND PLAN:  1  Weakness, dizziness   BP is low normal   I have asked her to follow   May need to cut back on meds (she is not taking  losartan )    Will check CBC, CMET     Stay hydrated, especially with warm weather   2   Postional bruits   the pt has continued to have problems with bruits when she lays down.  Severe sleep interruption.    CTA of neck, head in 2023 showed mild atherosclerosis of aorta  No significant carotid or intracranial dz.(Incidental thyroid  nodule noted Will refer to neuro     2  HTN   BP low normal  Follow   3 CADLHC in 2008   50% LAD      4  Hx PPM   Follows with EP       5  Lipids   Will get lipids   Keep on atorvastatin     6  CV dz   minimal CV dz       From a cardiac standpoint I think pt is at low risk for cardiac complication from colonscopy and OK to proceed    Current medicines are reviewed at length with the patient today.  The patient does not have concerns regarding medicines.  Signed, Vina Gull, MD  10/03/2023 5:06 PM    [facility] [address] [place], [place], [place]  [address] Phone: [phone]; Fax: [phone]

## 2023-10-04 DIAGNOSIS — I495 Sick sinus syndrome: Secondary | ICD-10-CM | POA: Diagnosis not present

## 2023-10-04 DIAGNOSIS — Z79899 Other long term (current) drug therapy: Secondary | ICD-10-CM | POA: Diagnosis not present

## 2023-10-04 DIAGNOSIS — E785 Hyperlipidemia, unspecified: Secondary | ICD-10-CM | POA: Diagnosis not present

## 2023-10-04 DIAGNOSIS — I2510 Atherosclerotic heart disease of native coronary artery without angina pectoris: Secondary | ICD-10-CM | POA: Diagnosis not present

## 2023-10-04 DIAGNOSIS — R0602 Shortness of breath: Secondary | ICD-10-CM | POA: Diagnosis not present

## 2023-10-04 DIAGNOSIS — I442 Atrioventricular block, complete: Secondary | ICD-10-CM | POA: Diagnosis not present

## 2023-10-05 ENCOUNTER — Ambulatory Visit: Payer: Self-pay | Admitting: Internal Medicine

## 2023-10-05 LAB — COMPREHENSIVE METABOLIC PANEL WITH GFR
ALT: 34 IU/L — AB (ref 0–32)
AST: 30 IU/L (ref 0–40)
Albumin: 4.2 g/dL (ref 3.7–4.7)
Alkaline Phosphatase: 82 IU/L (ref 44–121)
BUN/Creatinine Ratio: 17 (ref 12–28)
BUN: 17 mg/dL (ref 8–27)
Bilirubin Total: 0.4 mg/dL (ref 0.0–1.2)
CO2: 20 mmol/L (ref 20–29)
Calcium: 9.2 mg/dL (ref 8.7–10.3)
Chloride: 106 mmol/L (ref 96–106)
Creatinine, Ser: 1.01 mg/dL — AB (ref 0.57–1.00)
Globulin, Total: 2.2 g/dL (ref 1.5–4.5)
Glucose: 80 mg/dL (ref 70–99)
Potassium: 4.4 mmol/L (ref 3.5–5.2)
Sodium: 140 mmol/L (ref 134–144)
Total Protein: 6.4 g/dL (ref 6.0–8.5)
eGFR: 54 mL/min/1.73 — AB (ref >59)

## 2023-10-05 LAB — NMR, LIPOPROFILE
Cholesterol, Total: 146 mg/dL (ref 100–199)
HDL Particle Number: 37.1 umol/L (ref >=30.5)
HDL-C: 54 mg/dL (ref >39)
LDL Particle Number: 969 nmol/L (ref <1000)
LDL Size: 20.3 nm — AB (ref >20.5)
LDL-C (NIH Calc): 70 mg/dL (ref 0–99)
LP-IR Score: 57 — AB (ref <=45)
Small LDL Particle Number: 513 nmol/L (ref <=527)
Triglycerides: 128 mg/dL (ref 0–149)

## 2023-10-05 LAB — CBC
Hematocrit: 41.9 % (ref 34.0–46.6)
Hemoglobin: 13.6 g/dL (ref 11.1–15.9)
MCH: 29.1 pg (ref 26.6–33.0)
MCHC: 32.5 g/dL (ref 31.5–35.7)
MCV: 90 fL (ref 79–97)
Platelets: 196 x10E3/uL (ref 150–450)
RBC: 4.67 x10E6/uL (ref 3.77–5.28)
RDW: 12.4 % (ref 11.7–15.4)
WBC: 5.3 x10E3/uL (ref 3.4–10.8)

## 2023-10-05 LAB — PRO B NATRIURETIC PEPTIDE: NT-Pro BNP: 483 pg/mL (ref 0–738)

## 2023-10-05 LAB — SEDIMENTATION RATE: Sed Rate: 7 mm/h (ref 0–40)

## 2023-10-05 LAB — TSH: TSH: 2.05 u[IU]/mL (ref 0.450–4.500)

## 2023-10-06 ENCOUNTER — Telehealth: Payer: Self-pay | Admitting: Internal Medicine

## 2023-10-06 DIAGNOSIS — R42 Dizziness and giddiness: Secondary | ICD-10-CM

## 2023-10-06 NOTE — Telephone Encounter (Signed)
 Called pt to review labs   Overall look good  Make sure labs get to Dr Shayne  Alos --  Please refer to Dr Evonnie or Tobie for neuro   Complaint:  Dizziness

## 2023-10-07 NOTE — Addendum Note (Signed)
 Addended by: ACQUANETTA JENKINS HERO on: 10/07/2023 09:20 AM   Modules accepted: Orders

## 2023-10-07 NOTE — Telephone Encounter (Signed)
 Labs sent to Dr Shayne and order placed for Neuro.

## 2023-10-08 ENCOUNTER — Encounter: Payer: Self-pay | Admitting: Neurology

## 2023-10-08 ENCOUNTER — Telehealth: Payer: Self-pay | Admitting: Internal Medicine

## 2023-10-08 NOTE — Telephone Encounter (Signed)
 Recent lab work from 10/04/23 faxed to pt's PCP at number provided.

## 2023-10-08 NOTE — Telephone Encounter (Signed)
 Lori/PCP office requesting recent labs to be faxed to [phone]

## 2023-10-09 ENCOUNTER — Telehealth: Payer: Self-pay | Admitting: Internal Medicine

## 2023-10-09 DIAGNOSIS — E041 Nontoxic single thyroid nodule: Secondary | ICD-10-CM

## 2023-10-09 DIAGNOSIS — R0989 Other specified symptoms and signs involving the circulatory and respiratory systems: Secondary | ICD-10-CM

## 2023-10-09 DIAGNOSIS — R42 Dizziness and giddiness: Secondary | ICD-10-CM

## 2023-10-09 NOTE — Telephone Encounter (Signed)
 Patient hearing bruit in ear    Concerned blood flow problem Last USN in 2022  Please schedule repeat  Had thryoid notdules seen at that time   Can be re imaged (she is s/p FNA in 2022)

## 2023-10-09 NOTE — Addendum Note (Signed)
 Addended by: ACQUANETTA JENKINS HERO on: 10/09/2023 09:10 AM   Modules accepted: Orders

## 2023-10-09 NOTE — Telephone Encounter (Signed)
Carotid ultrasound ordered.

## 2023-10-14 ENCOUNTER — Encounter: Payer: Self-pay | Admitting: Internal Medicine

## 2023-10-14 ENCOUNTER — Ambulatory Visit: Attending: Internal Medicine | Admitting: Internal Medicine

## 2023-10-14 VITALS — BP 130/68 | HR 88 | Ht 63.0 in | Wt 165.3 lb

## 2023-10-14 DIAGNOSIS — I495 Sick sinus syndrome: Secondary | ICD-10-CM

## 2023-10-14 LAB — CUP PACEART INCLINIC DEVICE CHECK
Battery Impedance: 1649 Ohm
Battery Remaining Longevity: 42 mo
Battery Voltage: 2.76 V
Brady Statistic AP VP Percent: 0 %
Brady Statistic AP VS Percent: 100 %
Brady Statistic AS VP Percent: 0 %
Brady Statistic AS VS Percent: 0 %
Date Time Interrogation Session: 20250721165121
Implantable Lead Connection Status: 753985
Implantable Lead Connection Status: 753985
Implantable Lead Implant Date: 20150826
Implantable Lead Implant Date: 20150826
Implantable Lead Location: 753859
Implantable Lead Location: 753860
Implantable Lead Model: 5076
Implantable Lead Model: 5076
Implantable Pulse Generator Implant Date: 20150826
Lead Channel Impedance Value: 446 Ohm
Lead Channel Impedance Value: 635 Ohm
Lead Channel Pacing Threshold Amplitude: 0.5 V
Lead Channel Pacing Threshold Amplitude: 0.5 V
Lead Channel Pacing Threshold Pulse Width: 0.4 ms
Lead Channel Pacing Threshold Pulse Width: 0.4 ms
Lead Channel Setting Pacing Amplitude: 2 V
Lead Channel Setting Pacing Amplitude: 2.5 V
Lead Channel Setting Pacing Pulse Width: 0.4 ms
Lead Channel Setting Sensing Sensitivity: 4 mV
Zone Setting Status: 755011
Zone Setting Status: 755011

## 2023-10-14 NOTE — Patient Instructions (Signed)

## 2023-10-14 NOTE — Progress Notes (Signed)
 HPI Madison [name] returns today for ongoing evaluation of sinus node dysfunction, s/p PPM insertion. She has a h/o carotid stenosis. She denies chest pain or sob. No edema. She has been worked up for a swooshing sound in her ears. She denies syncope.  Allergies  Allergen Reactions   Tape Other (See Comments)    PAPER TAPE -Raw, itching, bleeding skin   Codeine Nausea And Vomiting and Other (See Comments)   Latex Other (See Comments)    Other reaction(s): Other (See Comments) Other   Other Nausea And Vomiting and Other (See Comments)    Reatha   Oxycodone-Acetaminophen  Other (See Comments)    Other reaction(s): Dizziness (intolerance) Other reaction(s): Dizziness (intolerance) Other reaction(s): Dizziness (intolerance)   Penicillins Other (See Comments)    Heart palpitations Has patient had a PCN reaction causing immediate rash, facial/tongue/throat swelling, SOB or lightheadedness with hypotension: no Has patient had a PCN reaction causing severe rash involving mucus membranes or skin necrosis: no Has patient had a PCN reaction that required hospitalization yes - caused her to get a heart catheterization Has patient had a PCN reaction occurring within the last 10 years: about 22 years ago If all of the above answers are NO, then may proceed with C   Percodan [Oxycodone-Aspirin ] Nausea And Vomiting   Pravastatin Other (See Comments)    Statins cause cramps   Repatha  [Evolocumab ]     Flu like symptoms   Vancomycin  Hives    Other reaction(s): Other (See Comments)   Epinephrine Other (See Comments) and Palpitations    Enhances longevity of Novocaine - severe heart palpatations      Current Outpatient Medications  Medication Sig Dispense Refill   albuterol (VENTOLIN HFA) 108 (90 Base) MCG/ACT inhaler Inhale 1 puff into the lungs as needed.     Aspirin  81 MG CAPS Take 81 mg by mouth 3 (three) times a week.     atenolol  (TENORMIN ) 25 MG tablet Take 1 tablet (25 mg total) by  mouth 3 (three) times daily. (Patient taking differently: Take 25 mg by mouth. 1 tab In the am, 1/2 - 1 tablet at Noon if BP is over 140/90, 1 tab in the pm) 270 tablet 3   atorvastatin (LIPITOR) 10 MG tablet Take 10 mg by mouth daily.     clonazePAM (KLONOPIN) 0.5 MG disintegrating tablet TAKE ONE TABLET UNDER THE TONGUE AND allow TO dissolve TWICE DAILY AS NEEDED (Patient taking differently: Take 0.5 mg by mouth. As needed)     dorzolamide (TRUSOPT) 2 % ophthalmic solution SMARTSIG:In Eye(s)     ezetimibe  (ZETIA ) 10 MG tablet TAKE 1 TABLET BY MOUTH ONCE DAILY 90 tablet 2   fluticasone  (FLONASE) 50 MCG/ACT nasal spray Place 1 spray into both nostrils daily as needed for allergies or rhinitis.     Multiple Vitamin (MULTIVITAMIN) tablet Take 1 tablet by mouth daily.     nitroGLYCERIN  (NITROSTAT ) 0.4 MG SL tablet Place 1 tablet (0.4 mg total) under the tongue every 5 (five) minutes as needed for chest pain. 25 tablet 3   polyethylene glycol (MIRALAX  / GLYCOLAX ) 17 g packet Take 17 g by mouth daily.     No current facility-administered medications for this visit.     Past Medical History:  Diagnosis Date   Acquired absence of breast and nipple    Acute myocardial infarction, unspecified site, episode of care unspecified    1965 and 2015    ALLERGIC RHINITIS    Anginal pain (  HCC)    Arthritis    Asthma    Blindness of left eye    decreased vision in left eye related to ocular occlusion    Breast cancer (HCC)    left mastectomy   Complication of anesthesia    Coronary atherosclerosis    Cough    Dysfunction of eustachian tube    Esophageal reflux    Glaucoma    Heart murmur    Hemorrhoids    Hyperlipidemia    Hypertension    Lymphedema    Pacemaker 2015   Dr [name]   Peripheral vascular disease (HCC)    Pneumonia    hx of walking pneumonia x 2    PONV (postoperative nausea and vomiting)    Presence of permanent cardiac pacemaker    PVC's (premature ventricular contractions)     Shortness of breath    Stress incontinence    Syncope 11/17/2013    ROS:   All systems reviewed and negative except as noted in the HPI.   Past Surgical History:  Procedure Laterality Date   ABDOMINAL HYSTERECTOMY  1967   CARDIAC CATHETERIZATION  03/01/1986   normal coronaries (Dr. MICAEL Ona)   CARDIAC CATHETERIZATION  10/25/2002   normal L main; LAD w/40% narrowing in prox 3rd and 60-70% narrowing beyond 1st diagonal, LAD was tortuous; dominant RCA with 30-40% segmental narrowing and 20-30% narrowing at junction of prox 3rd (Dr. FABIENE Pinion)   CARDIAC CATHETERIZATION  04/11/2006   trivial luminal irregularities in coronaries and mid LAD 50% (Dr. MICAEL Ona)   CARDIOPULMONARY MET TEST  05/05/2012   excellent effort w/RER 1.06, peak VO2>100%, peak HR 81%, good functional capacity   CAROTID DOPPLER  2005   normal study (ordered for swelling & pain, left neck clavicle to ear)   DILATION AND CURETTAGE OF UTERUS  1962-1968   x4   GALLBLADDER SURGERY  1985   MASTECTOMY Left 1981   MYOMECTOMY  1968   NM MYOCAR PERF WALL MOTION  2012   bruce myoview -no inducible ischemia, EF 71%, low risk scan   PERMANENT PACEMAKER INSERTION N/A 11/18/2013   Procedure: PERMANENT PACEMAKER INSERTION;  Surgeon: Madison LELON Waddell, MD;  Location: [facility] CATH LAB;  Service: Cardiovascular;  Laterality: N/A;   PLACEMENT OF BREAST IMPLANTS  1993   [facility]   REMOVAL OF BILATERAL TISSUE EXPANDERS WITH PLACEMENT OF BILATERAL BREAST IMPLANTS     TOTAL HIP ARTHROPLASTY Right 08/02/2015   Procedure: RIGHT TOTAL HIP ARTHROPLASTY ANTERIOR APPROACH;  Surgeon: Donnice Car, MD;  Location: [facility];  Service: Orthopedics;  Laterality: Right;   TRANSTHORACIC ECHOCARDIOGRAM  2014   EF 55-60%, grade 1 diastolic dysfunction; mildly thickened MV leaflets, trivial regurg      Family History  Problem Relation Age of Onset   Heart attack Brother    Stroke Brother    Lymphoma Sister    Colon cancer Sister    Heart disease Mother     Stroke Mother    Colon cancer Mother 44   Cancer Mother    CAD Brother        + stents   CAD Brother        + stents   Stroke Brother    Cervical cancer Sister    Liver cancer Brother    Heart disease Maternal Grandmother    Stroke Maternal Grandmother    Heart disease Maternal Grandfather    Cancer Maternal Grandfather    Heart disease Paternal Grandmother    Stroke  Paternal Grandfather    Cancer Sister    Cancer Brother      Social History   Socioeconomic History   Marital status: Widowed    Spouse name: Not on file   Number of children: 1   Years of education: ABD   Highest education level: Master's degree (e.g., MA, MS, MEng, MEd, MSW, MBA)  Occupational History   Occupation: RETIRED-swim Psychologist, occupational and physical educator  Tobacco Use   Smoking status: Never   Smokeless tobacco: Never  Vaping Use   Vaping status: Never Used  Substance and Sexual Activity   Alcohol  use: Yes    Comment: 2 glasses of wine with dinner occasional    Drug use: No   Sexual activity: Not on file  Other Topics Concern   Not on file  Social History Narrative   09/29/21 lives a [place]   Social Drivers of Health   Financial Resource Strain: Not on file  Food Insecurity: Not on file  Transportation Needs: Not on file  Physical Activity: Not on file  Stress: Not on file  Social Connections: Not on file  Intimate Partner Violence: Not on file     BP 130/68   Pulse 88   Ht 5' 3 (1.6 m)   Wt 165 lb 4.8 oz (75 kg)   SpO2 95%   BMI 29.28 kg/m   Physical Exam:  Well appearing NAD HEENT: Unremarkable Neck:  No JVD, no thyromegally Lymphatics:  No adenopathy Back:  No CVA tenderness Lungs:  Clear HEART:  Regular rate rhythm, no murmurs, no rubs, no clicks Abd:  soft, positive bowel sounds, no organomegally, no rebound, no guarding Ext:  2 plus pulses, no edema, no cyanosis, no clubbing Skin:  No rashes no nodules Neuro:  CN II through XII intact, motor grossly  intact  EKG  DEVICE  Normal device function.  See PaceArt for details.   Assess/Plan:  Sinus node dysfunction - she is asymptomatic s/p PPM insertion. She is atrial pacing about 99.5% of the time and is atrial dependent today. HTN - her bp is controlled. No change in meds.   Madison Yareli Carthen,MD

## 2023-10-21 ENCOUNTER — Ambulatory Visit (HOSPITAL_COMMUNITY)

## 2023-10-28 ENCOUNTER — Ambulatory Visit: Payer: Self-pay | Admitting: Internal Medicine

## 2023-10-28 ENCOUNTER — Ambulatory Visit (HOSPITAL_COMMUNITY)
Admission: RE | Admit: 2023-10-28 | Discharge: 2023-10-28 | Disposition: A | Discharge status: Home / Self Care | Source: Ambulatory Visit | Attending: Internal Medicine | Admitting: Internal Medicine

## 2023-10-28 ENCOUNTER — Ambulatory Visit (HOSPITAL_BASED_OUTPATIENT_CLINIC_OR_DEPARTMENT_OTHER)
Admission: RE | Admit: 2023-10-28 | Discharge: 2023-10-28 | Disposition: A | Discharge status: Home / Self Care | Source: Ambulatory Visit | Attending: Internal Medicine | Admitting: Internal Medicine

## 2023-10-28 DIAGNOSIS — R0602 Shortness of breath: Secondary | ICD-10-CM | POA: Diagnosis not present

## 2023-10-28 DIAGNOSIS — R0989 Other specified symptoms and signs involving the circulatory and respiratory systems: Secondary | ICD-10-CM

## 2023-10-28 DIAGNOSIS — R42 Dizziness and giddiness: Secondary | ICD-10-CM | POA: Diagnosis not present

## 2023-10-28 DIAGNOSIS — E041 Nontoxic single thyroid nodule: Secondary | ICD-10-CM

## 2023-10-28 DIAGNOSIS — I2510 Atherosclerotic heart disease of native coronary artery without angina pectoris: Secondary | ICD-10-CM | POA: Diagnosis not present

## 2023-10-28 LAB — ECHOCARDIOGRAM COMPLETE
AR max vel: 5.43 cm2
AV Area VTI: 5.35 cm2
AV Area mean vel: 5.29 cm2
AV Mean grad: 2 mmHg
AV Peak grad: 4.3 mmHg
Ao pk vel: 1.04 m/s
Area-P 1/2: 2.17 cm2
S' Lateral: 2.77 cm

## 2023-11-18 ENCOUNTER — Other Ambulatory Visit: Payer: Self-pay | Admitting: Internal Medicine

## 2023-12-13 ENCOUNTER — Ambulatory Visit (INDEPENDENT_AMBULATORY_CARE_PROVIDER_SITE_OTHER): Admitting: Neurology

## 2023-12-13 ENCOUNTER — Encounter: Payer: Self-pay | Admitting: Neurology

## 2023-12-13 VITALS — Ht 63.0 in | Wt 164.0 lb

## 2023-12-13 DIAGNOSIS — R42 Dizziness and giddiness: Secondary | ICD-10-CM

## 2023-12-13 DIAGNOSIS — R2689 Other abnormalities of gait and mobility: Secondary | ICD-10-CM

## 2023-12-13 DIAGNOSIS — H93292 Other abnormal auditory perceptions, left ear: Secondary | ICD-10-CM

## 2023-12-13 MED ORDER — SERTRALINE HCL 25 MG PO TABS: ORAL_TABLET | ORAL | 6 refills | Status: AC

## 2023-12-13 NOTE — Progress Notes (Signed)
 NEUROLOGY CONSULTATION NOTE  Madison [name] MRN: [id number] DOB: 27-Feb-1934  Referring provider: Dr. Vina Gull Primary care provider: Dr. Oneil Neth  Reason for consult:  dizziness  Dear Dr Gull:  Thank you for your kind referral of Madison [name] for consultation of the above symptoms. Although her history is well known to you, please allow me to reiterate it for the purpose of our medical record. She is alone in the office today. Records and images were personally reviewed where available.   HISTORY OF PRESENT ILLNESS: This is a pleasant 89 year old right-handed woman with a history of hypertension, hyperlipidemia, CAD, CHB with near syncope, sinus node dysfunction s/p PPM, left central retinal vein occlusion, eustachian tube dysfunction,presenting for evaluation of dizziness. She reports having symptoms off and on for years but never as worse as this past year. She feels like she will fall. Dizziness is described as a loss of balance, she uses a cane and staggers. If she turns her head or changes position in bed, she feels more symptoms. She takes her time getting up when sitting up in bed. She feels like there is fluid in the left ear, everything centered on the left side. She reports having injury to the left ear in high school where she was told she has no ear drum. She recalls having caloric testing in the 80s where she vomited. Dizziness is not all the time, there may be a flash of nausea with it so she sits or lies down. She feels better 10-15 minutes later. She usually put a dark cloth over her face but denies any headaches. She was having pulsatile tinnitus but states that since BP medication was adjusted and she takes additional 1/2 tablet of Atenolol  when BP goes more than 15 points, the swooshing goes away. She has not had it lately. She did 4 visits with physical therapy 6 months ago. Notes reviewed, grossly normal vestibular testing, they were working on balance.  She denies  any diplopia but has had some visual changes. One night she saw a flower arrangement just moving around on the dresser. She saw object of design of color. She has glaucoma in both eyes and does not see well out of the left eye. She denies any dysarthria/dysphagia. She has some neck and back stiffness. She has urinary frequency. She has neuropathy in both feet. She has had a head tremor for 50 years and notes it has worsened, no hand tremors. She denies any falls. She remains very active. Her husband passed away in 2020/01/28 and she reports her body started changing. She feels foggy in her head. She was started on Sertraline  which seemed to help, but she does not want to be on brain-altering drugs and weaned off a year ago. She takes 1/2 tablet clonazepam as needed for insomnia. Her sister had brain cancer, her grandson had brain surgery, nephew's daughter had pituitary surgery.  I personally reviewed head CT without contrast 08/2021 which was normal. CTA head and neck in 09/2021 normal. Unable to do MRI due to PPM.    PAST MEDICAL HISTORY: Past Medical History:  Diagnosis Date   Acquired absence of breast and nipple    Acute myocardial infarction, unspecified site, episode of care unspecified    01-28-1964 and Jan 27, 2014    ALLERGIC RHINITIS    Anginal pain (HCC)    Arthritis    Asthma    Blindness of left eye    decreased vision in left eye related to  ocular occlusion    Breast cancer (HCC)    left mastectomy   Complication of anesthesia    Coronary atherosclerosis    Cough    Dysfunction of eustachian tube    Esophageal reflux    Glaucoma    Heart murmur    Hemorrhoids    Hyperlipidemia    Hypertension    Lymphedema    Pacemaker 2015   Dr Waddell   Peripheral vascular disease (HCC)    Pneumonia    hx of walking pneumonia x 2    PONV (postoperative nausea and vomiting)    Presence of permanent cardiac pacemaker    PVC's (premature ventricular contractions)    Shortness of breath    Stress  incontinence    Syncope 11/17/2013    PAST SURGICAL HISTORY: Past Surgical History:  Procedure Laterality Date   ABDOMINAL HYSTERECTOMY  1967   CARDIAC CATHETERIZATION  03/01/1986   normal coronaries (Dr. MICAEL Ona)   CARDIAC CATHETERIZATION  10/25/2002   normal L main; LAD w/40% narrowing in prox 3rd and 60-70% narrowing beyond 1st diagonal, LAD was tortuous; dominant RCA with 30-40% segmental narrowing and 20-30% narrowing at junction of prox 3rd (Dr. FABIENE Pinion)   CARDIAC CATHETERIZATION  04/11/2006   trivial luminal irregularities in coronaries and mid LAD 50% (Dr. MICAEL Ona)   CARDIOPULMONARY MET TEST  05/05/2012   excellent effort w/RER 1.06, peak VO2>100%, peak HR 81%, good functional capacity   CAROTID DOPPLER  2005   normal study (ordered for swelling & pain, left neck clavicle to ear)   DILATION AND CURETTAGE OF UTERUS  1962-1968   x4   GALLBLADDER SURGERY  1985   MASTECTOMY Left 1981   MYOMECTOMY  1968   NM MYOCAR PERF WALL MOTION  2012   bruce myoview -no inducible ischemia, EF 71%, low risk scan   PERMANENT PACEMAKER INSERTION N/A 11/18/2013   Procedure: PERMANENT PACEMAKER INSERTION;  Surgeon: Danelle LELON Waddell, MD;  Location: [facility] CATH LAB;  Service: Cardiovascular;  Laterality: N/A;   PLACEMENT OF BREAST IMPLANTS  1993   [facility]   REMOVAL OF BILATERAL TISSUE EXPANDERS WITH PLACEMENT OF BILATERAL BREAST IMPLANTS     TOTAL HIP ARTHROPLASTY Right 08/02/2015   Procedure: RIGHT TOTAL HIP ARTHROPLASTY ANTERIOR APPROACH;  Surgeon: Donnice Car, MD;  Location: [facility] ORS;  Service: Orthopedics;  Laterality: Right;   TRANSTHORACIC ECHOCARDIOGRAM  2014   EF 55-60%, grade 1 diastolic dysfunction; mildly thickened MV leaflets, trivial regurg     MEDICATIONS: Current Outpatient Medications on File Prior to Visit  Medication Sig Dispense Refill   albuterol (VENTOLIN HFA) 108 (90 Base) MCG/ACT inhaler Inhale 1 puff into the lungs as needed.     Aspirin  81 MG CAPS Take 81 mg by mouth 3 (three)  times a week.     atenolol  (TENORMIN ) 25 MG tablet Take 1 tablet (25 mg total) by mouth 3 (three) times daily. 270 tablet 3   atorvastatin (LIPITOR) 10 MG tablet Take 10 mg by mouth daily.     Calcium Carbonate (CALCIUM 600 PO) Take by mouth.     clonazePAM (KLONOPIN) 0.5 MG disintegrating tablet TAKE ONE TABLET UNDER THE TONGUE AND allow TO dissolve TWICE DAILY AS NEEDED     Cyanocobalamin  (B-12 PO) Take by mouth.     dorzolamide (TRUSOPT) 2 % ophthalmic solution SMARTSIG:In Eye(s)     ezetimibe  (ZETIA ) 10 MG tablet TAKE 1 TABLET BY MOUTH ONCE DAILY 90 tablet 3   fluticasone  (FLONASE) 50 MCG/ACT nasal  spray Place 1 spray into both nostrils daily as needed for allergies or rhinitis.     Multiple Vitamin (MULTIVITAMIN) tablet Take 1 tablet by mouth daily.     nitroGLYCERIN  (NITROSTAT ) 0.4 MG SL tablet Place 1 tablet (0.4 mg total) under the tongue every 5 (five) minutes as needed for chest pain. 25 tablet 3   polyethylene glycol (MIRALAX  / GLYCOLAX ) 17 g packet Take 17 g by mouth daily.     No current facility-administered medications on file prior to visit.    ALLERGIES: Allergies  Allergen Reactions   Tape Other (See Comments)    PAPER TAPE -Raw, itching, bleeding skin   Codeine Nausea And Vomiting and Other (See Comments)   Latex Other (See Comments)    Other reaction(s): Other (See Comments) Other   Other Nausea And Vomiting and Other (See Comments)    Reatha   Oxycodone-Acetaminophen  Other (See Comments)    Other reaction(s): Dizziness (intolerance) Other reaction(s): Dizziness (intolerance) Other reaction(s): Dizziness (intolerance)   Penicillins Other (See Comments)    Heart palpitations Has patient had a PCN reaction causing immediate rash, facial/tongue/throat swelling, SOB or lightheadedness with hypotension: no Has patient had a PCN reaction causing severe rash involving mucus membranes or skin necrosis: no Has patient had a PCN reaction that required hospitalization yes -  caused her to get a heart catheterization Has patient had a PCN reaction occurring within the last 10 years: about 22 years ago If all of the above answers are NO, then may proceed with C   Percodan [Oxycodone-Aspirin ] Nausea And Vomiting   Pravastatin Other (See Comments)    Statins cause cramps   Repatha  [Evolocumab ]     Flu like symptoms   Vancomycin  Hives    Other reaction(s): Other (See Comments)   Epinephrine Other (See Comments) and Palpitations    Enhances longevity of Novocaine - severe heart palpatations     FAMILY HISTORY: Family History  Problem Relation Age of Onset   Heart attack Brother    Stroke Brother    Lymphoma Sister    Colon cancer Sister    Heart disease Mother    Stroke Mother    Colon cancer Mother 39   Cancer Mother    CAD Brother        + stents   CAD Brother        + stents   Stroke Brother    Cervical cancer Sister    Liver cancer Brother    Heart disease Maternal Grandmother    Stroke Maternal Grandmother    Heart disease Maternal Grandfather    Cancer Maternal Grandfather    Heart disease Paternal Grandmother    Stroke Paternal Grandfather    Cancer Sister    Cancer Brother     SOCIAL HISTORY: Social History   Socioeconomic History   Marital status: Widowed    Spouse name: Not on file   Number of children: 1   Years of education: ABD   Highest education level: Master's degree (e.g., MA, MS, MEng, MEd, MSW, MBA)  Occupational History   Occupation: RETIRED-swim Psychologist, occupational and physical educator  Tobacco Use   Smoking status: Never   Smokeless tobacco: Never  Vaping Use   Vaping status: Never Used  Substance and Sexual Activity   Alcohol  use: Not Currently    Comment: 2 glasses of wine with dinner occasional    Drug use: No   Sexual activity: Not on file  Other Topics Concern   Not on  file  Social History Narrative   09/29/21 lives a [place]      Living in [place], [place]--no steps   Right hand   Caffeine  occasional   Social Drivers of Corporate investment banker Strain: Not on file  Food Insecurity: Not on file  Transportation Needs: Not on file  Physical Activity: Not on file  Stress: Not on file  Social Connections: Not on file  Intimate Partner Violence: Not on file     PHYSICAL EXAM: Vitals:   12/13/23 0951  SpO2: 98%  Orthostatic VS for the past 72 hrs (Last 3 readings):  Orthostatic BP Patient Position BP Location Cuff Size Orthostatic Pulse  12/13/23 0955 134/74 Standing Right Arm Normal 68  12/13/23 0954 128/70 Standing Right Arm Normal 84  12/13/23 0951 132/69 Supine Right Arm Normal 93    General: No acute distress Head:  Normocephalic/atraumatic Skin/Extremities: No rash, no edema Neurological Exam: Mental status: alert and awake, no dysarthria or aphasia, Fund of knowledge is appropriate.  Attention and concentration are normal. Cranial nerves: CN I: not tested CN II: pupils equal, round, visual fields intact CN III, IV, VI:  full range of motion, no nystagmus, no ptosis CN V: facial sensation intact CN VII: upper and lower face symmetric CN VIII: hearing intact to conversation Bulk & Tone: normal, no fasciculations, no cogwheeling Motor: 5/5 throughout with no pronator drift. Sensation: intact to light touch, cold, pin on both UE, decreased cold to ankles bilaterally, intact pin. Decreased vibration sense to knees bilaterally, R>L. Romberg test negatve Deep Tendon Reflexes: +1 both UE, patella, absent ankle jerks Cerebellar: no incoordination on finger to nose testing Gait: narrow-based and steady, able to tandem walk adequately. Tremor: none   IMPRESSION: This is a pleasant 89 year old right-handed woman with a history of hypertension, hyperlipidemia, CAD, CHB with near syncope, sinus node dysfunction s/p PPM, left central retinal vein occlusion, eustachian tube dysfunction,presenting for evaluation of dizziness. She had vertigo in the past but more  recently has been having more balance difficulties. She was also reporting pulsatile tinnitus that seems to have improved with BP medication changes. Her neurological exam shows a length-dependent neuropathy, we discussed how neuropathy affects balance. Advised to resume Balance therapy. She also reports feeling like she is in a fog, encouraged to restart Sertraline  25mg  at bedtime, side effects discussed. She is reporting a feeling of fluid in the left ear and would like to see Dr. Jesus from ENT, referral will be sent. Follow-up in 4-5 months, call for any changes.   Thank you for allowing me to participate in the care of this patient. Please do not hesitate to call for any questions or concerns.   Darice Shivers, M.D.  CC: Dr. Okey, Dr. Shayne

## 2023-12-13 NOTE — Patient Instructions (Addendum)
 Good to meet you!  Referral will be sent to Dr. Jesus for ENT evaluation of left ear changes  2. Proceed with Vestibular therapy and Balance therapy  3. Restart Sertraline  25mg : take 1 tablet every night  4. Follow-up in 4-5 months, call for any changes

## 2023-12-17 ENCOUNTER — Ambulatory Visit (INDEPENDENT_AMBULATORY_CARE_PROVIDER_SITE_OTHER)

## 2023-12-17 ENCOUNTER — Telehealth: Payer: Self-pay | Admitting: Neurology

## 2023-12-17 DIAGNOSIS — I495 Sick sinus syndrome: Secondary | ICD-10-CM

## 2023-12-17 NOTE — Telephone Encounter (Signed)
 Pt wants to speak with Therisa about the referral for ENT. She is going out of town soon and would like to go ahead and get this appt over with. She states Therisa told her she would help her with getting the appt

## 2023-12-18 LAB — CUP PACEART REMOTE DEVICE CHECK
Battery Impedance: 1887 Ohm
Battery Remaining Longevity: 37 mo
Battery Voltage: 2.76 V
Brady Statistic AP VP Percent: 0 %
Brady Statistic AP VS Percent: 100 %
Brady Statistic AS VP Percent: 0 %
Brady Statistic AS VS Percent: 0 %
Date Time Interrogation Session: 20250923090258
Implantable Lead Connection Status: 753985
Implantable Lead Connection Status: 753985
Implantable Lead Implant Date: 20150826
Implantable Lead Implant Date: 20150826
Implantable Lead Location: 753859
Implantable Lead Location: 753860
Implantable Lead Model: 5076
Implantable Lead Model: 5076
Implantable Pulse Generator Implant Date: 20150826
Lead Channel Impedance Value: 469 Ohm
Lead Channel Impedance Value: 668 Ohm
Lead Channel Pacing Threshold Amplitude: 0.5 V
Lead Channel Pacing Threshold Amplitude: 0.625 V
Lead Channel Pacing Threshold Pulse Width: 0.4 ms
Lead Channel Pacing Threshold Pulse Width: 0.4 ms
Lead Channel Setting Pacing Amplitude: 2 V
Lead Channel Setting Pacing Amplitude: 2.5 V
Lead Channel Setting Pacing Pulse Width: 0.4 ms
Lead Channel Setting Sensing Sensitivity: 4 mV
Zone Setting Status: 755011
Zone Setting Status: 755011

## 2023-12-18 NOTE — Progress Notes (Signed)
 Remote PPM Transmission

## 2023-12-19 NOTE — Telephone Encounter (Signed)
 Spoke to pt--given the info. To call ENT [phone] to schedule appt.

## 2023-12-22 ENCOUNTER — Ambulatory Visit: Payer: Self-pay | Admitting: Internal Medicine

## 2023-12-23 ENCOUNTER — Encounter: Payer: Self-pay | Admitting: Neurology

## 2023-12-26 NOTE — Telephone Encounter (Signed)
 Patient called the ENT office and they states that they did not get a referral from us  please fax the referral to [phone]

## 2023-12-26 NOTE — Telephone Encounter (Signed)
 LVM-to pt Re-faxed ENT referral-[phone]

## 2023-12-27 NOTE — Telephone Encounter (Signed)
 Patient called and left a VM stating that the ENT on [address] has not gotten a referral from us . She has called them several time and called our office several times about this. She would like us  to resend the referral to fax number [phone]

## 2024-01-01 ENCOUNTER — Ambulatory Visit (HOSPITAL_COMMUNITY)
Admission: RE | Admit: 2024-01-01 | Discharge: 2024-01-01 | Disposition: A | Discharge status: Home / Self Care | Source: Ambulatory Visit | Attending: Internal Medicine | Admitting: Internal Medicine

## 2024-01-01 DIAGNOSIS — E041 Nontoxic single thyroid nodule: Secondary | ICD-10-CM | POA: Insufficient documentation

## 2024-01-01 DIAGNOSIS — E042 Nontoxic multinodular goiter: Secondary | ICD-10-CM | POA: Diagnosis not present

## 2024-01-05 ENCOUNTER — Ambulatory Visit: Payer: Self-pay | Admitting: Internal Medicine

## 2024-02-04 ENCOUNTER — Encounter: Payer: Self-pay | Admitting: Neurology

## 2024-02-04 DIAGNOSIS — R42 Dizziness and giddiness: Secondary | ICD-10-CM | POA: Diagnosis not present

## 2024-02-04 DIAGNOSIS — H903 Sensorineural hearing loss, bilateral: Secondary | ICD-10-CM | POA: Diagnosis not present

## 2024-02-04 DIAGNOSIS — H8112 Benign paroxysmal vertigo, left ear: Secondary | ICD-10-CM | POA: Diagnosis not present

## 2024-02-04 DIAGNOSIS — H6123 Impacted cerumen, bilateral: Secondary | ICD-10-CM | POA: Diagnosis not present

## 2024-02-24 ENCOUNTER — Telehealth: Payer: Self-pay | Admitting: Neurology

## 2024-02-24 NOTE — Telephone Encounter (Signed)
 Pt called and LM. States she needs a new ref sent over for therapy. Please call her to discuss

## 2024-02-25 ENCOUNTER — Telehealth: Payer: Self-pay | Admitting: Neurology

## 2024-02-25 NOTE — Telephone Encounter (Signed)
 Pt called no answer left a voice mail to call the office back

## 2024-02-25 NOTE — Telephone Encounter (Signed)
 Pt called in this afternoon. Pt stated that she need a new order for the therapy and the balance. Thanks

## 2024-02-25 NOTE — Telephone Encounter (Signed)
 I advised her to resume Balance therapy, looks like she needs a new referral, pls check where she was doing it and send referral, thanks

## 2024-02-25 NOTE — Telephone Encounter (Signed)
 Order has been faxed to [facility]

## 2024-02-25 NOTE — Telephone Encounter (Signed)
 Order for balance therapy has been faxed to [place], [phone]

## 2024-03-12 ENCOUNTER — Other Ambulatory Visit (HOSPITAL_COMMUNITY): Payer: Self-pay | Admitting: Internal Medicine

## 2024-03-12 DIAGNOSIS — R1032 Left lower quadrant pain: Secondary | ICD-10-CM

## 2024-03-17 ENCOUNTER — Ambulatory Visit

## 2024-03-17 DIAGNOSIS — I495 Sick sinus syndrome: Secondary | ICD-10-CM

## 2024-03-18 LAB — CUP PACEART REMOTE DEVICE CHECK
Battery Impedance: 1858 Ohm
Battery Remaining Longevity: 38 mo
Battery Voltage: 2.76 V
Brady Statistic AP VP Percent: 0 %
Brady Statistic AP VS Percent: 100 %
Brady Statistic AS VP Percent: 0 %
Brady Statistic AS VS Percent: 0 %
Date Time Interrogation Session: 20251224084919
Implantable Lead Connection Status: 753985
Implantable Lead Connection Status: 753985
Implantable Lead Implant Date: 20150826
Implantable Lead Implant Date: 20150826
Implantable Lead Location: 753859
Implantable Lead Location: 753860
Implantable Lead Model: 5076
Implantable Lead Model: 5076
Implantable Pulse Generator Implant Date: 20150826
Lead Channel Impedance Value: 461 Ohm
Lead Channel Impedance Value: 634 Ohm
Lead Channel Pacing Threshold Amplitude: 0.5 V
Lead Channel Pacing Threshold Amplitude: 0.5 V
Lead Channel Pacing Threshold Pulse Width: 0.4 ms
Lead Channel Pacing Threshold Pulse Width: 0.4 ms
Lead Channel Setting Pacing Amplitude: 2 V
Lead Channel Setting Pacing Amplitude: 2.5 V
Lead Channel Setting Pacing Pulse Width: 0.4 ms
Lead Channel Setting Sensing Sensitivity: 4 mV
Zone Setting Status: 755011
Zone Setting Status: 755011

## 2024-03-18 NOTE — Progress Notes (Signed)
 Remote PPM Transmission

## 2024-03-22 ENCOUNTER — Ambulatory Visit: Payer: Self-pay | Admitting: Internal Medicine

## 2024-03-24 ENCOUNTER — Other Ambulatory Visit (HOSPITAL_COMMUNITY): Payer: Self-pay | Admitting: Radiology

## 2024-03-25 ENCOUNTER — Ambulatory Visit (HOSPITAL_COMMUNITY)
Admission: RE | Admit: 2024-03-25 | Discharge: 2024-03-25 | Disposition: A | Discharge status: Home / Self Care | Source: Ambulatory Visit | Attending: Internal Medicine | Admitting: Internal Medicine

## 2024-03-25 DIAGNOSIS — R1032 Left lower quadrant pain: Secondary | ICD-10-CM | POA: Diagnosis present

## 2024-03-25 MED ORDER — IOHEXOL 300 MG/ML  SOLN
100.0000 mL | Freq: Once | INTRAMUSCULAR | Status: AC | PRN
  Administered 2024-03-25: 100 mL via INTRAVENOUS

## 2024-04-15 ENCOUNTER — Ambulatory Visit: Payer: Self-pay | Admitting: Neurology

## 2024-06-16 ENCOUNTER — Encounter

## 2024-09-15 ENCOUNTER — Encounter

## 2024-12-15 ENCOUNTER — Encounter

## 2025-03-16 ENCOUNTER — Encounter
